# Patient Record
Sex: Female | Born: 1955
Health system: Southern US, Community
[De-identification: ages and names within clinical notes are randomized; demographics above are authoritative.]

## PROBLEM LIST (undated history)

## (undated) DIAGNOSIS — I8392 Asymptomatic varicose veins of left lower extremity: Secondary | ICD-10-CM

## (undated) DIAGNOSIS — C801 Malignant (primary) neoplasm, unspecified: Secondary | ICD-10-CM

## (undated) DIAGNOSIS — I8390 Asymptomatic varicose veins of unspecified lower extremity: Secondary | ICD-10-CM

## (undated) DIAGNOSIS — D649 Anemia, unspecified: Secondary | ICD-10-CM

## (undated) HISTORY — DX: Anemia, unspecified: D64.9

## (undated) HISTORY — PX: NO PAST SURGERIES: SHX2092

## (undated) HISTORY — PX: OTHER SURGICAL HISTORY: SHX169

## (undated) HISTORY — DX: Asymptomatic varicose veins of unspecified lower extremity: I83.90

---

## 2004-06-26 ENCOUNTER — Other Ambulatory Visit: Admission: RE | Admit: 2004-06-26 | Discharge: 2004-06-26 | Payer: Self-pay | Admitting: Family Medicine

## 2007-02-11 ENCOUNTER — Other Ambulatory Visit: Admission: RE | Admit: 2007-02-11 | Discharge: 2007-02-11 | Payer: Self-pay | Admitting: Family Medicine

## 2007-04-16 ENCOUNTER — Encounter: Admission: RE | Admit: 2007-04-16 | Discharge: 2007-04-16 | Payer: Self-pay | Admitting: Family Medicine

## 2008-07-18 LAB — FERRITIN: Ferritin: 4.7

## 2008-07-18 LAB — IRON: IRON: 35

## 2008-07-18 LAB — HDL CHOLESTEROL: CHOL/HDL RATIO, SERUM: 2.8

## 2008-07-19 LAB — LIPID PANEL
CHOLESTEROL: 224 mg/dL — AB (ref 0–200)
HDL: 80 mg/dL — AB (ref 35–70)
LDL Cholesterol: 104 mg/dL
TRIGLYCERIDES: 206 mg/dL — AB (ref 40–160)

## 2008-07-19 LAB — BASIC METABOLIC PANEL: CREATININE: 0.7 mg/dL (ref 0.5–1.1)

## 2008-07-19 LAB — CBC AND DIFFERENTIAL: HEMOGLOBIN: 9.9 g/dL — AB (ref 12.0–16.0)

## 2008-07-19 LAB — TSH: TSH: 3 u[IU]/mL (ref 0.41–5.90)

## 2013-06-02 LAB — LIPID PANEL
Cholesterol: 226 mg/dL — AB (ref 0–200)
HDL: 75 mg/dL — AB (ref 35–70)
LDL Cholesterol: 130 mg/dL
Triglycerides: 103 mg/dL (ref 40–160)

## 2013-06-02 LAB — BASIC METABOLIC PANEL: Creatinine: 0.6 mg/dL (ref 0.5–1.1)

## 2013-06-09 ENCOUNTER — Ambulatory Visit: Payer: BC Managed Care – PPO | Admitting: Neurology

## 2013-06-09 ENCOUNTER — Encounter: Payer: Self-pay | Admitting: Neurology

## 2013-06-11 ENCOUNTER — Ambulatory Visit (INDEPENDENT_AMBULATORY_CARE_PROVIDER_SITE_OTHER): Payer: BC Managed Care – PPO | Admitting: Neurology

## 2013-06-11 ENCOUNTER — Encounter: Payer: Self-pay | Admitting: Neurology

## 2013-06-11 VITALS — BP 106/63 | HR 66 | Ht 64.5 in | Wt 137.0 lb

## 2013-06-11 DIAGNOSIS — R209 Unspecified disturbances of skin sensation: Secondary | ICD-10-CM

## 2013-06-11 DIAGNOSIS — R531 Weakness: Secondary | ICD-10-CM | POA: Insufficient documentation

## 2013-06-11 DIAGNOSIS — R200 Anesthesia of skin: Secondary | ICD-10-CM | POA: Insufficient documentation

## 2013-06-11 DIAGNOSIS — R5381 Other malaise: Secondary | ICD-10-CM

## 2013-06-11 NOTE — Progress Notes (Signed)
[facility]    Provider:  Dr Hosie Poisson Referring Provider: Edison Pace, FNP Primary Care Physician:  Edison Pace  CC:  RLE weakness and sensory changes  HPI:  Paige Gilbert is a 57 y.o. female here as a referral from Dr. Maisie Fus for RLE weakness and sensory changes.  Symptoms started around 5 years ago, noted severe throbbing pain in right mid thigh, went away after short time. Will occasionally return periodically, lasts for few seconds and then goes away. Notes a generalized heaviness in her leg, feels it is harder to pick up. This is constant. No numbness or paresthesias in her toes. Also notes some heaviness in her right shoulder and arm. No weakness noted. No symptoms on her left side. No episodes of blurry vision or loss of vision.   Has plantar fascitis in right foot, improved and then got worse when she started to run again. Around 5 years ago had brief episodes of recurrent pain in her left forearm.   No history of HTN, DM, no prior stroke or mini strokes.   Review of Systems: Out of a complete 14 system review, the patient complains of only the following symptoms, and all other reviewed systems are negative. Positive for numbness  History   Social History  . Marital Status: Married    Spouse Name: Jeannett Senior    Number of Children: 5  . Years of Education: 16   Occupational History  . Not on file.   Social History Main Topics  . Smoking status: Never Smoker   . Smokeless tobacco: Never Used  . Alcohol Use: Yes     Comment: rarely  . Drug Use: No  . Sexual Activity: Not on file   Other Topics Concern  . Not on file   Social History Narrative   Patient is married Jeannett Senior).   Patient has 5 children.   Patient works full-time.   Patient drinks one cup of coffee daily.   Patient has a college education (BS).   Patient is right-handed.    Family History  Problem Relation Age of Onset  . Diabetes Father   . Blindness Mother   .  Congestive Heart Failure Sister   . Gallbladder disease Sister     removed  . Diabetes Sister   . Colitis Sister     Past Medical History  Diagnosis Date  . Anemia   . Varicose vein     Past Surgical History  Procedure Laterality Date  . None      Current Outpatient Prescriptions  Medication Sig Dispense Refill  . Calcium Citrate-Vitamin D (CALCIUM CITRATE + D PO) Take by mouth 2 (two) times daily as needed.      . Cholecalciferol (VITAMIN D PO) Take by mouth daily.      . Multiple Vitamin (MULTIVITAMIN) capsule Take 1 capsule by mouth daily.       No current facility-administered medications for this visit.    Allergies as of 06/11/2013  . (No Known Allergies)    Vitals: BP 106/63  Pulse 66  Ht 5' 4.5" (1.638 m)  Wt 137 lb (62.143 kg)  BMI 23.16 kg/m2 Last Weight:  Wt Readings from Last 1 Encounters:  06/11/13 137 lb (62.143 kg)   Last Height:   Ht Readings from Last 1 Encounters:  06/11/13 5' 4.5" (1.638 m)     Physical exam: Exam: Gen: NAD, conversant Eyes: anicteric sclerae, moist conjunctivae HENT: Atraumatic, oropharynx clear Neck: Trachea midline; supple,  Lungs: CTA,  no wheezing, rales, rhonic                          CV: RRR, no MRG Abdomen: Soft, non-tender;  Extremities: No peripheral edema  Skin: Normal temperature, no rash,  Psych: Appropriate affect, pleasant  Neuro: MS: AA&Ox3, appropriately interactive, normal affect   Speech: fluent w/o paraphasic error  Memory: good recent and remote recall  CN: PERRL, EOMI no nystagmus, no ptosis, sensation intact to LT V1-V3 bilat, face symmetric, no weakness, hearing grossly intact, palate elevates symmetrically, shoulder shrug 5/5 bilat,  tongue protrudes midline, no fasiculations noted.  Motor: normal bulk and tone Strength: 5/5  In all extremities with exception of minimal 5 minus out of 5 weakness in proximal right lower extremity  Coord: rapid alternating and point-to-point (FNF,  HTS) movements intact.  Reflexes: symmetrical, bilat downgoing toes  Sens: Minimally decreased light touch on the right side upper and lower extremity compared to the left  Gait: posture, stance, stride and arm-swing normal. Tandem gait intact. Able to walk on heels and toes. Romberg absent.   Assessment:  After physical and neurologic examination, review of laboratory studies, imaging, neurophysiology testing and pre-existing records, assessment will be reviewed on the problem list.  Plan:  Treatment plan and additional workup will be reviewed under Problem List.  1)Weakness 2)Hemisensory loss  57 year old woman presenting for initial evaluation of sensation of heaviness and weakness on the right lower extremity and upper extremity. The symptoms have been ongoing for a few years. She also notes intermittent episodes of pain in the right anterior thigh and in the past pain in the left forearm. Unclear etiology of her symptoms. With unilateral nature of symptoms would need to rule out central process. Will check brain MRI. Follow up once MRI completed.

## 2013-06-11 NOTE — Patient Instructions (Signed)
Overall you are doing fairly well but I do want to suggest a few things today:   Remember to drink plenty of fluid, eat healthy meals and do not skip any meals. Try to eat protein with a every meal and eat a healthy snack such as fruit or nuts in between meals. Try to keep a regular sleep-wake schedule and try to exercise daily, particularly in the form of walking, 20-30 minutes a day, if you can.   We will check a brain MRI to help determine the cause of your symptoms. Someone will call you in a few days to schedule this.  We will call you once the MRI is complete to discuss the results. Please call us with any interim questions, concerns, problems, updates or refill requests.   My clinical assistant and will answer any of your questions and relay your messages to me and also relay most of my messages to you.   Our phone number is [phone]. We also have an after hours call service for urgent matters and there is a physician on-call for urgent questions. For any emergencies you know to call 911 or go to the nearest emergency room

## 2013-07-05 ENCOUNTER — Ambulatory Visit
Admission: RE | Admit: 2013-07-05 | Discharge: 2013-07-05 | Disposition: A | Discharge status: Home / Self Care | Payer: BC Managed Care – PPO | Source: Ambulatory Visit | Attending: Neurology | Admitting: Neurology

## 2013-07-05 ENCOUNTER — Other Ambulatory Visit: Payer: Self-pay

## 2013-07-05 DIAGNOSIS — R531 Weakness: Secondary | ICD-10-CM

## 2013-07-05 DIAGNOSIS — R200 Anesthesia of skin: Secondary | ICD-10-CM

## 2013-07-05 DIAGNOSIS — R5381 Other malaise: Secondary | ICD-10-CM

## 2013-07-05 DIAGNOSIS — R5383 Other fatigue: Secondary | ICD-10-CM

## 2013-07-13 ENCOUNTER — Telehealth: Payer: Self-pay | Admitting: Neurology

## 2013-07-13 NOTE — Telephone Encounter (Signed)
Please advise 

## 2013-07-14 NOTE — Telephone Encounter (Signed)
Please advise 

## 2013-07-14 NOTE — Telephone Encounter (Signed)
REGARDING MRI RESULTS

## 2013-08-03 NOTE — Telephone Encounter (Signed)
I called pt after consulting Dr. Janann Colonel.  MRI basically normal.  (one spot of non specific gliosis).  She reiterated all of her sx R sided.  Did not want to make  F/u appt at this time.   Will call if worsening sx.

## 2013-08-03 NOTE — Telephone Encounter (Signed)
Please try cell if home is not answered. This phonenote was never handled she still wants her MRI results

## 2015-02-15 ENCOUNTER — Encounter: Payer: Self-pay | Admitting: Family Medicine

## 2015-02-15 ENCOUNTER — Ambulatory Visit (INDEPENDENT_AMBULATORY_CARE_PROVIDER_SITE_OTHER): Payer: 59 | Admitting: Family Medicine

## 2015-02-15 VITALS — BP 122/68 | HR 67 | Wt 134.0 lb

## 2015-02-15 DIAGNOSIS — M545 Low back pain, unspecified: Secondary | ICD-10-CM | POA: Insufficient documentation

## 2015-02-15 DIAGNOSIS — Z1211 Encounter for screening for malignant neoplasm of colon: Secondary | ICD-10-CM | POA: Diagnosis not present

## 2015-02-15 DIAGNOSIS — M722 Plantar fascial fibromatosis: Secondary | ICD-10-CM | POA: Diagnosis not present

## 2015-02-15 DIAGNOSIS — Z139 Encounter for screening, unspecified: Secondary | ICD-10-CM | POA: Diagnosis not present

## 2015-02-15 DIAGNOSIS — Z862 Personal history of diseases of the blood and blood-forming organs and certain disorders involving the immune mechanism: Secondary | ICD-10-CM

## 2015-02-15 NOTE — Assessment & Plan Note (Addendum)
Plan for watchfal waiting. Continue intermittent NSAIDs. If worsening will proceed with x-ray. May consider physical therapy intermittently for exacerbations needed.

## 2015-02-15 NOTE — Assessment & Plan Note (Signed)
Sent for mammography and colonoscopy. Routine laboratory screening for liver kidney cholesterol and anemia.

## 2015-02-15 NOTE — Assessment & Plan Note (Signed)
Heel cups dispensed today. Work on eccentric exercises and ice massage. Consider orthotics if not better.

## 2015-02-15 NOTE — Progress Notes (Signed)
Paige Gilbert is a 59 y.o. female who presents to [facility]  today for establish care. Patient is a healthy 59 year old woman here to establish care. She has a history of intermittent left-sided low back pain and left plantar fasciitis. Otherwise she is asymptomatic.  1)  Back pain: Left-sided. Pain is intermittent for years but worse recently over the last month. No radiating pain weakness or numbness fevers chills nausea vomiting or diarrhea. No night sweats or weight loss. She takes ibuprofen which helps a lot.  2) plantar fasciitis left-sided: Present for 2 years mild intermittent off and on. She's done some home exercise program for this which has helped. She does not have any orthotics or heel cups. Pain is worse with activity.   3) history of anemia: Patient has history of anemia with a hemoglobin less than 10 and a ferritin less than 5. She is not currently taking iron. She is postmenopausal.   4) Cancer screening: Patient is due for colon cancer and breast cancer screening. She thinks she has not had a Pap smear in several years as well.    Past Medical History  Diagnosis Date  . Anemia   . Varicose vein    Past Surgical History  Procedure Laterality Date  . None     History  Substance Use Topics  . Smoking status: Never Smoker   . Smokeless tobacco: Never Used  . Alcohol Use: Yes     Comment: rarely   ROS as above Medications: Current Outpatient Prescriptions  Medication Sig Dispense Refill  . Calcium Citrate-Vitamin D (CALCIUM CITRATE + D PO) Take by mouth 2 (two) times daily as needed.     No current facility-administered medications for this visit.   No Known Allergies   Exam:  BP 122/68 mmHg  Pulse 67  Wt 134 lb (60.782 kg) Gen: Well NAD HEENT: EOMI,  MMM Lungs: Normal work of breathing. CTABL Heart: RRR no MRG Abd: NABS, Soft. Nondistended, Nontender Exts: Brisk capillary refill, warm and well perfused.   back:  Nontender to midline. Tender palpation left lumbar paraspinal. Normal back range of motion. Reflexes strength are equal and intact bilateral lower extremities. Normal gait. Feet bilaterally left foot mildly tender plantar calcaneus left nontender. Otherwise feet are symmetrical with normal-appearing arch with no break down. Pulses and capillary refill and sensation are intact.  No results found for this or any previous visit (from the past 24 hour(s)). No results found.   Please see individual assessment and plan sections.

## 2015-02-15 NOTE — Patient Instructions (Signed)
Thank you for coming in today. Return in 1 year or sooner if needed.  Do the exercises for plantar fasciitis.  Let me know if you want to go to physical therapy for your back.  Come back or go to the emergency room if you notice new weakness new numbness problems walking or bowel or bladder problems. Call or go to the emergency room if you get worse, have trouble breathing, have chest pains, or palpitations.

## 2015-02-16 ENCOUNTER — Telehealth: Payer: Self-pay | Admitting: Family Medicine

## 2015-02-16 DIAGNOSIS — N183 Chronic kidney disease, stage 3 unspecified: Secondary | ICD-10-CM | POA: Insufficient documentation

## 2015-02-16 LAB — COMPLETE METABOLIC PANEL WITH GFR
ALK PHOS: 58 U/L (ref 39–117)
ALT: 21 U/L (ref 0–35)
AST: 19 U/L (ref 0–37)
Albumin: 4.1 g/dL (ref 3.5–5.2)
BUN: 7 mg/dL (ref 6–23)
CO2: 27 mEq/L (ref 19–32)
CREATININE: 2.11 mg/dL — AB (ref 0.50–1.10)
Calcium: 9.3 mg/dL (ref 8.4–10.5)
Chloride: 105 mEq/L (ref 96–112)
GFR, EST AFRICAN AMERICAN: 29 mL/min — AB
GFR, EST NON AFRICAN AMERICAN: 25 mL/min — AB
GLUCOSE: 76 mg/dL (ref 70–99)
Potassium: 4.9 mEq/L (ref 3.5–5.3)
SODIUM: 142 meq/L (ref 135–145)
Total Bilirubin: 0.4 mg/dL (ref 0.2–1.2)
Total Protein: 6.6 g/dL (ref 6.0–8.3)

## 2015-02-16 LAB — CBC
HCT: 38.8 % (ref 36.0–46.0)
Hemoglobin: 13.1 g/dL (ref 12.0–15.0)
MCH: 28.7 pg (ref 26.0–34.0)
MCHC: 33.8 g/dL (ref 30.0–36.0)
MCV: 85.1 fL (ref 78.0–100.0)
MPV: 9.4 fL (ref 8.6–12.4)
Platelets: 159 10*3/uL (ref 150–400)
RBC: 4.56 MIL/uL (ref 3.87–5.11)
RDW: 13.4 % (ref 11.5–15.5)
WBC: 4.9 10*3/uL (ref 4.0–10.5)

## 2015-02-16 LAB — IRON AND TIBC
%SAT: 35 % (ref 20–55)
IRON: 94 ug/dL (ref 42–145)
TIBC: 272 ug/dL (ref 250–470)
UIBC: 178 ug/dL (ref 125–400)

## 2015-02-16 LAB — LDL CHOLESTEROL, DIRECT: LDL DIRECT: 91 mg/dL

## 2015-02-16 LAB — FERRITIN: FERRITIN: 70 ng/mL (ref 10–291)

## 2015-02-16 NOTE — Telephone Encounter (Signed)
Ms Beinhoff's creatine is elevated. I think this is likely a chronic process. We need to refer to nephrology for evaluation.  CMA will contact the patient.

## 2015-02-16 NOTE — Telephone Encounter (Signed)
Pt ha

## 2015-02-17 ENCOUNTER — Telehealth: Payer: Self-pay

## 2015-02-17 NOTE — Telephone Encounter (Signed)
Pt wants to know if you think it ok for her to travel out of the country with her new diagnoses of kidney disease. She is planning on going on an 11 day trip. Please advise.

## 2015-02-17 NOTE — Telephone Encounter (Signed)
Pt notified of results and recommendations.

## 2015-02-19 NOTE — Telephone Encounter (Signed)
It should be fine. Her kidney disease is likely chronic. She should drink plenty of fluids and avoid dehydration. Limit use of ibuprofen and aleve. Tylenol for pain is safe to use.

## 2015-02-20 NOTE — Telephone Encounter (Signed)
Pt notified of recommendations

## 2015-02-21 ENCOUNTER — Encounter: Payer: Self-pay | Admitting: Family Medicine

## 2015-02-24 ENCOUNTER — Telehealth: Payer: Self-pay

## 2015-02-24 DIAGNOSIS — Z139 Encounter for screening, unspecified: Secondary | ICD-10-CM

## 2015-02-24 DIAGNOSIS — N183 Chronic kidney disease, stage 3 unspecified: Secondary | ICD-10-CM

## 2015-02-24 NOTE — Telephone Encounter (Signed)
Pt called requesting to have her creatinine and GFR rechecked as well as a lipid panel. Labs ordered.

## 2015-03-02 ENCOUNTER — Encounter: Payer: Self-pay | Admitting: Family Medicine

## 2015-03-28 LAB — BASIC METABOLIC PANEL WITH GFR
BUN: 17 mg/dL (ref 7–25)
CHLORIDE: 105 mmol/L (ref 98–110)
CO2: 24 mmol/L (ref 20–31)
Calcium: 8.8 mg/dL (ref 8.6–10.4)
Creat: 0.76 mg/dL (ref 0.50–1.05)
GFR, EST NON AFRICAN AMERICAN: 87 mL/min (ref >=60)
GFR, Est African American: >89 mL/min (ref >=60)
GLUCOSE: 91 mg/dL (ref 65–99)
POTASSIUM: 4 mmol/L (ref 3.5–5.3)
Sodium: 138 mmol/L (ref 135–146)

## 2015-03-28 NOTE — Telephone Encounter (Signed)
Quick Note:  Kidney function has normalized. !!! ______

## 2016-03-21 ENCOUNTER — Encounter: Payer: Self-pay | Admitting: Family Medicine

## 2019-07-13 ENCOUNTER — Other Ambulatory Visit: Payer: Self-pay | Admitting: Oncology

## 2019-08-03 ENCOUNTER — Telehealth: Payer: Self-pay | Admitting: *Deleted

## 2019-08-03 DIAGNOSIS — Z170 Estrogen receptor positive status [ER+]: Secondary | ICD-10-CM | POA: Insufficient documentation

## 2019-08-03 DIAGNOSIS — C50411 Malignant neoplasm of upper-outer quadrant of right female breast: Secondary | ICD-10-CM

## 2019-08-03 NOTE — Progress Notes (Signed)
Paige Gilbert  Telephone:([phone] Fax:([phone]) [phone]     ID: [name] DOB: 1955-11-11  MR#: [id number]  ZHG#:[id number]  Patient Care Team: [name], NP as PCP - General (Family Medicine) [name], RN as Oncology Nurse Navigator [name], RN as Oncology Nurse Navigator Loriana Samad, [name], MD as Consulting Physician (Oncology) [name], MD as Attending Physician (Radiation Oncology) [name], MD as Consulting Physician (General Surgery) [name], MD OTHER MD:  CHIEF COMPLAINT: Locally advanced estrogen receptor positive breast cancer  CURRENT TREATMENT: Neoadjuvant chemotherapy   HISTORY OF CURRENT ILLNESS: Paige Gilbert has noted a difference between her right and left breast for several years.  She last had a mammogram she thinks about 7 or 8 years ago.  More recently it seemed to her that her right breast was becoming more firm.  She brought it to medical attention and underwent bilateral diagnostic mammography with tomography and right breast ultrasonography at [facility] on 07/07/2019 showing: heterogeneously dense breast tissue; right nipple retraction; architectural distortion with microcalcifications and irregular dense tissue in the upper-outer quadrant; at least 3 abnormal right axillary lymph nodes.  Accordingly on 07/13/2019 she proceeded to biopsy of the right breast area in question. The pathology from this procedure ([id number]) showed:   1. Right Breast, upper-outer quadrant, closer to the nipple  - invasive ductal carcinoma with micropapillary features, grade 2  - ductal carcinoma in situ, grade 2-3, solid and cribriform pattern with comedonecrosis  - Prognostic indicators significant for: estrogen receptor, 100% positive with strong staining intensity and progesterone receptor, 0% negative. HER2 equivocal by immunohistochemistry (2+), but negative by fluorescent in situ hybridization with a  signals ratio 1.5 and number per cell 3.8.  2. Right Breast, upper-outer quadrant, axillary tail  - invasive ductal carcinoma, grade 2  - ductal carcinoma in situ, grade 2-3  - Prognostic indicators significant for: estrogen receptor, 100% positive and progesterone receptor, 50% positive, both with strong staining intensity. HER2 equivocal by immunohistochemistry (2+), but negative by fluorescent in situ hybridization with a signals ratio 1.9 and number per cell 5.3.  The patient's subsequent history is as detailed below.   INTERVAL HISTORY: Paige Gilbert was evaluated in the breast cancer clinic on 08/04/2019 accompanied by her husband Paige Gilbert.  She is already scheduled to meet with Dr. Brantley Stage later this week.  She has not yet been scheduled with the radiation oncologist  REVIEW OF SYSTEMS: Paige Gilbert denies unusual headaches, visual changes, nausea, vomiting, stiff neck, dizziness, or gait imbalance. There has been no cough, phlegm production, or pleurisy, no chest pain or pressure, and no change in bowel or bladder habits. The patient denies fever, rash, bleeding, unexplained fatigue or unexplained weight loss.  For exercise she does her work and also likes to take walks.  A detailed review of systems was otherwise entirely negative.   PAST MEDICAL HISTORY: Past Medical History:  Diagnosis Date  . Anemia   . Varicose vein     PAST SURGICAL HISTORY: Past Surgical History:  Procedure Laterality Date  . none      FAMILY HISTORY: Family History  Problem Relation Age of Onset  . Diabetes Father   . Blindness Mother   . Congestive Heart Failure Sister   . Gallbladder disease Sister        removed  . Diabetes Sister   . Colitis Sister    The patient's father died at age 11 from a stroke.  The patient's mother died from multiple  medical problems not related to cancer at age 28.  The patient had 4 brothers, 7 sisters.  There is no breast ovarian or prostate cancer in the family to her  knowledge.  She had one cousin with pancreatic cancer.  GYNECOLOGIC HISTORY:  No LMP recorded. Patient is postmenopausal. Menarche: 64 years old Age at first live birth: 64 years old Paige Gilbert 5 LMP 10 Contraceptive: Brief use of oral contraceptives as a teenager HRT no Hysterectomy?  No BSO?    SOCIAL HISTORY: (updated 07/2019)  Aryah works in a Engineer, agricultural, and Education administrator houses.  Her husband of 12 years Paige Gilbert is a Animal nutritionist and doing research.  Their children are Paige Gilbert, 58, who lives in [place] and is a Veterinary surgeon in the [employer]; Paige Gilbert 33 who works as a Designer, jewellery at [employer]; Paige Gilbert who lives in [place], and is not currently employed; Paige Gilbert lives in [place], and Paige Gilbert who is an Armed forces training and education officer in [place].  The patient has 4 grandchildren all under the age of 31    ADVANCED DIRECTIVES: Husband Paige Gilbert is her HCPOA.   HEALTH MAINTENANCE: Social History   Tobacco Use  . Smoking status: Never Smoker  . Smokeless tobacco: Never Used  Substance Use Topics  . Alcohol use: Yes    Comment: rarely  . Drug use: No     Colonoscopy: never done  PAP: Remote  Bone density: Never   No Known Allergies  Current Outpatient Medications  Medication Sig Dispense Refill  . Calcium Citrate-Vitamin D (CALCIUM CITRATE + D PO) Take by mouth 2 (two) times daily as needed.     No current facility-administered medications for this visit.    OBJECTIVE: Middle-aged white woman in no acute distress  Vitals:   08/04/19 1559  BP: 116/70  Pulse: 86  Resp: 18  Temp: 97.8 F (36.6 C)  SpO2: 100%     Body mass index is 23.83 kg/m.   Wt Readings from Last 3 Encounters:  08/04/19 141 lb (64 kg)  02/15/15 134 lb (60.8 kg)  06/11/13 137 lb (62.1 kg)      ECOG FS:1 - Symptomatic but completely ambulatory  Ocular: Sclerae unicteric, pupils round and equal Ear-nose-throat: Wearing a mask Lymphatic: No cervical or supraclavicular  adenopathy Lungs no rales or rhonchi Heart regular rate and rhythm Abd soft, nontender, positive bowel sounds MSK no focal spinal tenderness, no joint edema Neuro: non-focal, well-oriented, appropriate affect Breasts: The right breast is firm in the whole upper half, with evidence of skin involvement and nipple retraction.  There is at least one very small palpable lymph node in the right axilla.  The left breast is unremarkable.  Right breast 08/13/2018     LAB RESULTS:  CMP     Component Value Date/Time   NA 139 08/04/2019 1546   K 4.3 08/04/2019 1546   CL 105 08/04/2019 1546   CO2 25 08/04/2019 1546   GLUCOSE 104 (H) 08/04/2019 1546   BUN 25 (H) 08/04/2019 1546   CREATININE 0.79 08/04/2019 1546   CREATININE 0.76 03/27/2015 1146   CALCIUM 8.9 08/04/2019 1546   PROT 7.0 08/04/2019 1546   ALBUMIN 4.2 08/04/2019 1546   AST 17 08/04/2019 1546   ALT 21 08/04/2019 1546   ALKPHOS 67 08/04/2019 1546   BILITOT 0.2 (L) 08/04/2019 1546   GFRNONAA >60 08/04/2019 1546   GFRNONAA 87 03/27/2015 1146   GFRAA >60 08/04/2019 1546   GFRAA >89 03/27/2015 1146    No results  found for: TOTALPROTELP, ALBUMINELP, A1GS, A2GS, BETS, BETA2SER, GAMS, MSPIKE, SPEI  Lab Results  Component Value Date   WBC 5.7 08/04/2019   NEUTROABS 3.3 08/04/2019   HGB 13.1 08/04/2019   HCT 38.8 08/04/2019   MCV 86.0 08/04/2019   PLT 177 08/04/2019    No results found for: LABCA2  No components found for: TRRNHA579  No results for input(s): INR in the last 168 hours.  No results found for: LABCA2  No results found for: UXY333  No results found for: OVA919  No results found for: TYO060  No results found for: CA2729  No components found for: HGQUANT  No results found for: CEA1 / No results found for: CEA1   No results found for: AFPTUMOR  No results found for: CHROMOGRNA  No results found for: KPAFRELGTCHN, LAMBDASER, KAPLAMBRATIO (kappa/lambda light chains)  No results found for:  HGBA, HGBA2QUANT, HGBFQUANT, HGBSQUAN (Hemoglobinopathy evaluation)   No results found for: LDH  Lab Results  Component Value Date   IRON 94 02/15/2015   TIBC 272 02/15/2015   IRONPCTSAT 35 02/15/2015   (Iron and TIBC)  Lab Results  Component Value Date   FERRITIN 70 02/15/2015    Urinalysis No results found for: COLORURINE, APPEARANCEUR, LABSPEC, PHURINE, GLUCOSEU, HGBUR, BILIRUBINUR, KETONESUR, PROTEINUR, UROBILINOGEN, NITRITE, LEUKOCYTESUR   STUDIES: No results found. Case Report Surgical Pathology                Case: [id number]                 Authorizing Provider: [name], NP     Collected:      07/13/2019 0901       Ordering Location:   [facility] Radiology Department Received:      07/13/2019 0933       Pathologist:      Geanie Logan, MD                               Specimens:  1) - Breast, Needle Core Biopsy, Right Upper Outer Mass                       2) - Breast, Needle Core Biopsy, Right Upper Outer Mass                  Reason for Amendment  Amended synoptic report for part 1 with results of FISH. The diagnosis is unchanged. Comment: This value was suppressed on a previously verified result.   Addendum 2 PART 2 HER2 FISH  HER2 FISH analysis performed by two observers at [facility] demonstrated the following (see attached report for details):  HER2/CEN-17 ratio: 1.9 HER2 signals/cell: 5.3 ISH group 4 ? In the context of the patient's IHC results (2+), the HER2 is interpreted as NEGATIVE* per the updated 2018 ASCO/CAP HER2 testing guidelines. ? *It is uncertain whether patients with an average of ? 4.0 and < 6.0 HER2 signals per cell and an HER2/CEP17 ratio of < 2.0 benefit from HER2 targeted therapy in the absence of protein overexpression (IHC 3+). If the specimen test result is close to the ISH ratio threshold for  positive, there is a high likelihood that repeat testing will result in different results by chance alone. Therefore, when [facility] results are not 3+ positive, it is recommended that the sample be considered HER2 negative without additional testing on the same specimen. Comment: This value was suppressed  on a previously verified result.   Addendum 1  PART 1 HER2 FISH  HER-2 FISH studies performed and interpreted at [facility] are negative for amplification.  HER2/CEN-17 Ratio: 1.5 Avg number of HER2 Signals/Nucleus: 3.8  Please see separate report for full details. Comment: This value was suppressed on a previously verified result.   Final Diagnosis 1.  Right breast, upper outer mass closer to the nipple, ultrasound guided needle core biopsy:   Invasive ductal carcinoma with micropapillary features, Nottingham combined histologic grade 2 of 3, measuring at least 0.5 cm.  Ductal carcinoma in situ (DCIS), nuclear grade II-III, solid and cribriform pattern with comedonecrosis.  2.  Right breast, upper outer mass, axillary tail, ultrasound guided needle core biopsy:   Invasive ductal carcinoma, Nottingham combined histologic grade 2 of 3, measuring at least 1.0 cm.  Ductal carcinoma in situ (DCIS), nuclear grade II-III.  Note:  Immunohistochemical stains for ER, PR and Her-2/neu are in progress for parts 1 and 2, and those results will be reported in a separate addendum.   Synoptic Report Breast Biomarker Reporting Template ((Added in Addendum) BREAST: BIOMARKER REPORTING TEMPLATE - 1)  Protocol posted: 09/23/2018    Test(s) Performed:      Estrogen Receptor (ER) Status:  Positive (greater than 10% of cells demonstrate nuclear positivity)      Percentage of Cells with Nuclear Positivity:  100 %     Average Intensity of Staining:  Strong     Test Type:  Food and Drug Administration (FDA) cleared (test / vendor): [employer]     Primary Antibody:  SP1    Test(s)  Performed:      Progesterone Receptor (PgR) Status:  Negative (less than 1%)      :  Internal control cells present and stain as expected     Test Type:  Food and Drug Administration (FDA) cleared (test / vendor): [employer]ana     Primary Antibody:  1E2    Test(s) Performed:      HER2 by Immunohistochemistry:  Equivocal (Score 2+)      Percentage of Cells with Uniform Intense Complete Membrane Staining:  5 %    Test Type:  Food and Drug Administration (FDA) cleared (test / vendor): [employer]     Primary Antibody:  4B5    Test(s) Performed:      HER2 by in situ Hybridization:  Negative (not amplified)     Method:  Dual probe assay      Average Number of HER2 Signals per Cell:  3.8      Average Number of CEP17 Signals per Cell:  2.5      HER2 / CEP17 Ratio:  1.5     Test Type:  Food and Drug Administration (FDA) cleared (test / vendor): [employer]AKO HER2 IQFISH    Cold Ischemia and Fixation Times:  Meet requirements specified in latest version of the ASCO / CAP Guidelines   METHODS   Fixative:  Formalin    Image Analysis:  Not performed   Comment(s)  Comment(s):  Her 2 sent for [facility]  Breast Biomarker Reporting Template ((Added in Addendum) BREAST: BIOMARKER REPORTING TEMPLATE - 2)  Protocol posted: 09/23/2018    Test(s) Performed:      Estrogen Receptor (ER) Status:  Positive (greater than 10% of cells demonstrate nuclear positivity)      Percentage of Cells with Nuclear Positivity:  100 %     Average Intensity of Staining:  Strong     Test Type:  Food  and Drug Administration (FDA) cleared (test / vendor): Ventana     Primary Antibody:  SP1    Test(s) Performed:      Progesterone Receptor (PgR) Status:  Positive      Percentage of Cells with Nuclear Positivity:  50 %     Average Intensity of Staining:  Strong     Test Type:  Food and Drug Administration (FDA) cleared  (test / vendor): Ventana     Primary Antibody:  1E2    Test(s) Performed:      HER2 by Immunohistochemistry:  Equivocal (Score 2+)      Percentage of Cells with Uniform Intense Complete Membrane Staining:  2 %    Test Type:  Food and Drug Administration (FDA) cleared (test / vendor): [employer]ana     Primary Antibody:  4B5    Cold Ischemia and Fixation Times:  Meet requirements specified in latest version of the ASCO / CAP Guidelines   METHODS   Fixative:  Formalin    Image Analysis:  Not performed   Comment(s)  Comment(s):  Her 2 sent for [facility]    Clinical Information Diagnosis: N63.20 - Mass of left breast [ICD-10-CM]  #1 Time out: 0901 Time in: 0901 Total time in formalin: 6 hours, 26 minutes. #2 Time out: 0913 Time in: 0913 Total time in formalin: 6 hours, 17 minutes.   Gross Description 1. Labeled "right upper outer mass closer to the nipple". Received in formalin are approximately 5 yellow-white needle core biopsy fragments up to 1.9 cm, inked with hematoxylin, all in one.  2. Labeled "right upper outer mass, axillary tail". Received in formalin are approximately 3 yellow-white needle core biopsy fragments up to 1.7 cm, inked with hematoxylin, all in one.   Disclaimer The above diagnosis is based on performance of thorough gross and/or microscopic evaluations. Immunoperoxidase procedures were developed and performance characteristics determined by [facility]. Not all have been cleared or approved by the U.S. Food and Drug Administration.      For Flow Cytometry and Class I analyte-specific reagents (ASRs): This test was developed and its performance characteristics determined by [facility]. It has not been cleared or approved by the [place] Food and Drug Administration. The FDA does not require this test to go through premarket FDA review. This test is used for clinical purposes. It should  not be regarded as investigational or for research. This laboratory is certified under the Clinical Laboratory Improvement Amendments (CLIA) as qualified to perform high complexity clinical laboratory testing.  [facility] CLIA Medical Director: Judithann Sauger, MD   Specimen Collected on  Tissue - Breast structure (body structure), Tissue specimen (specimen) - Breast      ELIGIBLE FOR AVAILABLE RESEARCH PROTOCOL:   ASSESSMENT: 64 y.o. [place], [place] woman status post right breast biopsy x2 on 07/13/2019 for a clinical T3-T4 N1, stage III invasive ductal carcinoma, grade 2, estrogen receptor positive, progesterone receptor variable, HER-2 not amplified.  (1) neoadjuvant chemotherapy to consist of cyclophosphamide and doxorubicin in dose dense fashion x4 followed by weekly paclitaxel x12  (2) definitive surgery to follow  (3) adjuvant radiation as appropriate  (4) antiestrogens to start at the completion of local treatment  PLAN: I spent approximately 60 minutes face to face with Paige Gilbert with more than 50% of that time spent in counseling and coordination of care. Specifically we reviewed the biology of the patient's diagnosis and the specifics of her situation.  We first reviewed  the fact that cancer is not one disease but more than 100 different diseases and that it is important to keep them separate-- otherwise when friends and relatives discuss their own cancer experiences with [facility] confusion can result. Similarly we explained that if breast cancer spreads to the bone or liver, the patient would not have bone cancer or liver cancer, but breast cancer in the bone and breast cancer in the liver: one cancer in three places-- not 3 different cancers which otherwise would have to be treated in 3 different ways.  We discussed the difference between local and systemic therapy. In terms of loco-regional treatment, lumpectomy plus radiation is equivalent to mastectomy as far  as survival is concerned.  However given the locally advanced stage of her breast cancer at this point there is no question that breast conserving surgery would not be possible.  For that reason I would recommend neoadjuvant chemotherapy.  She understands there is no survival advantage to starting with chemotherapy or to starting with surgery.  The results are equivalent.  The advantage of starting with chemotherapy in her case is that she may have some small possibility of saving the breast.  More importantly she will have better margins and the surgery will be facilitated.  We then discussed the rationale for systemic therapy. There is some risk that this cancer may have already spread to other parts of her body.  In stage III disease that risk is probably in the 40 to 60% range.  Accordingly she does need staging studies and we are setting her up for a CT of the chest and a bone scan.  Since she has no symptoms at present I am hopeful that those will be negative.  Next we went over the options for systemic therapy which are anti-estrogens, anti-HER-2 immunotherapy, and chemotherapy. Paige Gilbert does not meet criteria for anti-HER-2 immunotherapy. She is a good candidate for anti-estrogens and she will need chemotherapy as well.  We specifically discussed cyclophosphamide and doxorubicin followed by weekly paclitaxel.  We reviewed the possible toxicities side effects and complications of these agents.  The patient is agreeable and we are hoping to start on August 16, 2018  She will need a port, a chest CT scan, a bone scan, a breast MRI, and an echocardiogram.  I have placed all those orders and sent a note to her surgeon Dr. Brantley Stage who will be seeing the patient in the next few days.  I am also arranging for her to meet with our chemotherapy teaching nurse.  The patient will then return to see me a few days before the start of chemo to review any questions or concerns she may have before actually  starting treatment  Paige Gilbert has a good understanding of the overall plan. She agrees with it. She knows the goal of treatment in her case is cure. She will call with any problems that may develop before her next visit here.  Total time spent in this encounter 70 minutes  [name], MD   08/04/2019 5:28 PM Medical Oncology and Hematology [facility] [place], [place] [address] Tel. [phone]    Fax. [phone]   This document serves as a record of services personally performed by Lurline Del, MD. It was created on his behalf by Wilburn Mylar, a trained medical scribe. The creation of this record is based on the scribe's personal observations and the provider's statements to them.   Lindie Spruce MD, have reviewed the  above documentation for accuracy and completeness, and I agree with the above.

## 2019-08-03 NOTE — Telephone Encounter (Signed)
Received self referral from pt to be seen at [facility]. Pt with mammo/US/bx at [facility]. Scheduled and confirmed appt with Dr. Jana Hakim on 1/6 at 4pm with labs at 3:30. Gave pt instructions and directions. Contact information provided for questions or needs.

## 2019-08-04 ENCOUNTER — Inpatient Hospital Stay: Payer: Managed Care, Other (non HMO) | Attending: Oncology

## 2019-08-04 ENCOUNTER — Inpatient Hospital Stay (HOSPITAL_BASED_OUTPATIENT_CLINIC_OR_DEPARTMENT_OTHER): Payer: Managed Care, Other (non HMO) | Admitting: Oncology

## 2019-08-04 ENCOUNTER — Other Ambulatory Visit: Payer: Self-pay

## 2019-08-04 VITALS — BP 116/70 | HR 86 | Temp 97.8°F | Resp 18 | Ht 64.5 in | Wt 141.0 lb

## 2019-08-04 DIAGNOSIS — Z170 Estrogen receptor positive status [ER+]: Secondary | ICD-10-CM

## 2019-08-04 DIAGNOSIS — Z452 Encounter for adjustment and management of vascular access device: Secondary | ICD-10-CM | POA: Insufficient documentation

## 2019-08-04 DIAGNOSIS — C50411 Malignant neoplasm of upper-outer quadrant of right female breast: Secondary | ICD-10-CM

## 2019-08-04 DIAGNOSIS — Z5189 Encounter for other specified aftercare: Secondary | ICD-10-CM | POA: Insufficient documentation

## 2019-08-04 DIAGNOSIS — Z5111 Encounter for antineoplastic chemotherapy: Secondary | ICD-10-CM | POA: Insufficient documentation

## 2019-08-04 LAB — CMP (CANCER CENTER ONLY)
ALT: 21 U/L (ref 0–44)
AST: 17 U/L (ref 15–41)
Albumin: 4.2 g/dL (ref 3.5–5.0)
Alkaline Phosphatase: 67 U/L (ref 38–126)
Anion gap: 9 (ref 5–15)
BUN: 25 mg/dL — ABNORMAL HIGH (ref 8–23)
CO2: 25 mmol/L (ref 22–32)
Calcium: 8.9 mg/dL (ref 8.9–10.3)
Chloride: 105 mmol/L (ref 98–111)
Creatinine: 0.79 mg/dL (ref 0.44–1.00)
GFR, Est AFR Am: >60 mL/min (ref >60)
GFR, Estimated: >60 mL/min (ref >60)
Glucose, Bld: 104 mg/dL — ABNORMAL HIGH (ref 70–99)
Potassium: 4.3 mmol/L (ref 3.5–5.1)
Sodium: 139 mmol/L (ref 135–145)
Total Bilirubin: 0.2 mg/dL — ABNORMAL LOW (ref 0.3–1.2)
Total Protein: 7 g/dL (ref 6.5–8.1)

## 2019-08-04 LAB — CBC WITH DIFFERENTIAL (CANCER CENTER ONLY)
Abs Immature Granulocytes: 0.01 10*3/uL (ref 0.00–0.07)
Basophils Absolute: 0.1 10*3/uL (ref 0.0–0.1)
Basophils Relative: 1 %
Eosinophils Absolute: 0.1 10*3/uL (ref 0.0–0.5)
Eosinophils Relative: 1 %
HCT: 38.8 % (ref 36.0–46.0)
Hemoglobin: 13.1 g/dL (ref 12.0–15.0)
Immature Granulocytes: 0 %
Lymphocytes Relative: 34 %
Lymphs Abs: 1.9 10*3/uL (ref 0.7–4.0)
MCH: 29 pg (ref 26.0–34.0)
MCHC: 33.8 g/dL (ref 30.0–36.0)
MCV: 86 fL (ref 80.0–100.0)
Monocytes Absolute: 0.3 10*3/uL (ref 0.1–1.0)
Monocytes Relative: 5 %
Neutro Abs: 3.3 10*3/uL (ref 1.7–7.7)
Neutrophils Relative %: 59 %
Platelet Count: 177 10*3/uL (ref 150–400)
RBC: 4.51 MIL/uL (ref 3.87–5.11)
RDW: 12.7 % (ref 11.5–15.5)
WBC Count: 5.7 10*3/uL (ref 4.0–10.5)
nRBC: 0 % (ref 0.0–0.2)

## 2019-08-04 NOTE — Progress Notes (Signed)
START ON PATHWAY REGIMEN - Breast   Dose-Dense AC q14 days:   A cycle is every 14 days:     Doxorubicin      Cyclophosphamide      [id number]-[id number]   **Always confirm dose/schedule in your pharmacy ordering system**  Paclitaxel 80 mg/m2 Weekly:   Administer weekly:     Paclitaxel   **Always confirm dose/schedule in your pharmacy ordering system**  Patient Characteristics: Preoperative or Nonsurgical Candidate (Clinical Staging), Neoadjuvant Therapy followed by Surgery, Invasive Disease, Chemotherapy, HER2 Negative/Unknown/Equivocal, ER Positive Therapeutic Status: Preoperative or Nonsurgical Candidate (Clinical Staging) AJCC M Category: cM0 AJCC Grade: G2 Breast Surgical Plan: Neoadjuvant Therapy followed by Surgery ER Status: Positive (+) AJCC 8 Stage Grouping: IIIB HER2 Status: Negative (-) AJCC T Category: cT4 AJCC N Category: cN1 PR Status: Positive (+) Intent of Therapy: Curative Intent, Discussed with Patient

## 2019-08-05 ENCOUNTER — Ambulatory Visit: Payer: Self-pay | Admitting: Surgery

## 2019-08-05 ENCOUNTER — Encounter: Payer: Self-pay | Admitting: *Deleted

## 2019-08-05 ENCOUNTER — Other Ambulatory Visit: Payer: Self-pay | Admitting: *Deleted

## 2019-08-05 DIAGNOSIS — C50411 Malignant neoplasm of upper-outer quadrant of right female breast: Secondary | ICD-10-CM

## 2019-08-05 DIAGNOSIS — Z170 Estrogen receptor positive status [ER+]: Secondary | ICD-10-CM

## 2019-08-06 ENCOUNTER — Encounter: Payer: Self-pay | Admitting: Radiation Oncology

## 2019-08-06 ENCOUNTER — Telehealth: Payer: Self-pay | Admitting: *Deleted

## 2019-08-06 ENCOUNTER — Telehealth: Payer: Self-pay | Admitting: Oncology

## 2019-08-06 NOTE — Telephone Encounter (Signed)
R/s appt per 1/8 sch message - pt aware of appt date and time

## 2019-08-06 NOTE — Telephone Encounter (Signed)
Left vm with pt regarding CT scan. Informed of CT scan that is scheduled for 1/12 at 7:45. Contact information provided for questions or needs.

## 2019-08-06 NOTE — Progress Notes (Signed)
Location of Breast Cancer: Right Breast  Histology per Pathology Report:  07/13/19 1. Right Breast, upper-outer quadrant, closer to the nipple             - invasive ductal carcinoma with micropapillary features, grade 2             - ductal carcinoma in situ, grade 2-3, solid and cribriform pattern with comedonecrosis             - Prognostic indicators significant for: estrogen receptor, 100% positive with strong staining intensity and progesterone receptor, 0% negative. Proliferation marker Ki67 at %. HER2 equivocal by immunohistochemistry (2+), but negative by fluorescent in situ hybridization with a signals ratio 1.5 and number per cell 3.8.  2. Right Breast, upper-outer quadrant, axillary tail             - invasive ductal carcinoma, grade 2             - ductal carcinoma in situ, grade 2-3             - Prognostic indicators significant for: estrogen receptor, 100% positive and progesterone receptor, 50% positive, both with strong staining intensity. Proliferation marker Ki67 at %. HER2 equivocal by immunohistochemistry (2+), but negative by fluorescent in situ hybridization with a signals ratio 1.9 and number per cell 5.3   Did patient present with symptoms or was this found on screening mammography?: She noted that her right breast had become firmer than her left and brought it to the attention of a Nurse Practitioner during her annual physical on 06/21/19. The NP palpated a right breast mass in the right upper outer quadrant.   Past/Anticipated interventions by surgeon, if any: Dr. Brantley Stage 08/09/19  Past/Anticipated interventions by medical oncology, if any:  08/04/19 Dr. Jana Hakim 1) neoadjuvant chemotherapy to consist of cyclophosphamide and doxorubicin in dose dense fashion x4 followed by weekly paclitaxel x12 (2) definitive surgery to follow (3) adjuvant radiation as appropriate (4) antiestrogens to start at the completion of local treatment  Lymphedema issues, if any:   N/A  Pain issues, if any:  She denies  SAFETY ISSUES:  Prior radiation? No  Pacemaker/ICD? No  Possible current pregnancy? No  Is the patient on methotrexate? No  Current Complaints / other details:      Paige Gilbert, Stephani Police, RN 08/06/2019,9:45 AM

## 2019-08-09 ENCOUNTER — Ambulatory Visit: Payer: Self-pay | Admitting: Surgery

## 2019-08-09 ENCOUNTER — Telehealth: Payer: Self-pay

## 2019-08-09 NOTE — Telephone Encounter (Signed)
Pt called stating she has contacted insurance company and they have authorized her CT scan.  Pt also states that Per her insurance company that information will be sent to [place] today.

## 2019-08-09 NOTE — H&P (Signed)
Paige Gilbert Documented: 08/09/2019 2:20 PM Location: [facility] Patient #: [id number] DOB: 13-Feb-1956 Married / Language: English / Race: White Female  History of Present Illness Paige Moores A. Erryn Dickison Paige Gilbert; 08/09/2019 2:46 PM) Patient words: Patient sent at the request of Dr. Jana Hakim of oncology due to a recently diagnosed right breast cancer. Patient relates a multi-month history of hardening of the right breast with contraction of the nipple. She went to a Facility and underwent workup which included bilateral diagnostic mammography and right breast ultrasound last month. She had multifocal lesions in the upper outer quadrant near the nipple and up in the axillary tail. Core biopsy showed invasive ductal carcinoma grade 2 with DCIS as she receptor positive progesterone receptor negative and equivocal HER-2/neu. Second area more toward the tail had a similar pattern except this was progesterone receptor positive. She had a maternal aunt with breast cancer. She has had no drainage. She is in need of chemotherapy and is here today to talk about port placement and subsequent surgery down the road.       per oncology    Paige Gilbert has noted a difference between her right and left breast for several years. She last had a mammogram she thinks about 7 or 8 years ago. More recently it seemed to her that her right breast was becoming more firm. She brought it to medical attention and underwent bilateral diagnostic mammography with tomography and right breast ultrasonography at [facility] on 07/07/2019 showing: heterogeneously dense breast tissue; right nipple retraction; architectural distortion with microcalcifications and irregular dense tissue in the upper-outer quadrant; at least 3 abnormal right axillary lymph nodes.  Accordingly on 07/13/2019 she proceeded to biopsy of the right breast area in question. The pathology from this procedure ([id number]) showed:  1.  Right Breast, upper-outer quadrant, closer to the nipple - invasive ductal carcinoma with micropapillary features, grade 2 - ductal carcinoma in situ, grade 2-3, solid and cribriform pattern with comedonecrosis - Prognostic indicators significant for: estrogen receptor, 100% positive with strong staining intensity and progesterone receptor, 0% negative. HER2 equivocal by immunohistochemistry (2+), but negative by fluorescent in situ hybridization with a signals ratio 1.5 and number per cell 3.8.  2. Right Breast, upper-outer quadrant, axillary tail - invasive ductal carcinoma, grade 2 - ductal carcinoma in situ, grade 2-3 - Prognostic indicators significant for: estrogen receptor, 100% positive and progesterone receptor, 50% positive, both with strong staining intensity. HER2 equivocal by immunohistochemistry (2+), but negative by fluorescent in situ hybridization with a signals ratio 1.9 and number per cell 5.3.  The patient is a 64 year old female.   Past Surgical History Paige Gilbert, Paige Gilbert; 08/09/2019 2:20 PM) Oral Surgery  Diagnostic Studies History Paige Gilbert, Paige Gilbert; 08/09/2019 2:20 PM) Colonoscopy never Mammogram within last year Pap Smear >5 years ago  Allergies Paige Gilbert, CMA; 08/09/2019 2:22 PM) No Known Drug Allergies [08/09/2019]: Allergies Reconciled  Medication History Paige Gilbert, Paige Gilbert; 08/09/2019 2:22 PM) Medications Reconciled  Social History Paige Gilbert, Paige Gilbert; 08/09/2019 2:20 PM) Alcohol use Occasional alcohol use. Caffeine use Coffee, Tea. No drug use Tobacco use Never smoker.  Family History Paige Gilbert, Paige Gilbert; 08/09/2019 2:20 PM) Arthritis Mother. Cerebrovascular Accident Father, Mother. Diabetes Mellitus Father, Sister. Heart Disease Brother, Father, Sister. Heart disease in female family member before age 47 Hypertension Brother, Father. Migraine Headache  Daughter. Respiratory Condition Daughter.  Pregnancy / Birth History Paige Gilbert, Paige Gilbert; 08/09/2019 2:20 PM) Age at menarche 70 years. Age of menopause 29-50 Gravida 5 Length (months) of  breastfeeding 12-24 Maternal age 28-25 Para 41  Other Problems Paige Gilbert, Paige Gilbert; 08/09/2019 2:20 PM) Breast Cancer Hypercholesterolemia Lump In Breast Vascular Disease     Review of Systems Paige Gilbert CMA; 08/09/2019 2:20 PM) General Not Present- Appetite Loss, Chills, Fatigue, Fever, Night Sweats, Weight Gain and Weight Loss. Skin Not Present- Change in Wart/Mole, Dryness, Hives, Jaundice, New Lesions, Non-Healing Wounds, Rash and Ulcer. HEENT Present- Wears glasses/contact lenses. Not Present- Earache, Hearing Loss, Hoarseness, Nose Bleed, Oral Ulcers, Ringing in the Ears, Seasonal Allergies, Sinus Pain, Sore Throat, Visual Disturbances and Yellow Eyes. Respiratory Not Present- Bloody sputum, Chronic Cough, Difficulty Breathing, Snoring and Wheezing. Breast Present- Breast Mass and Skin Changes. Not Present- Breast Pain and Nipple Discharge. Cardiovascular Not Present- Chest Pain, Difficulty Breathing Lying Down, Leg Cramps, Palpitations, Rapid Heart Rate, Shortness of Breath and Swelling of Extremities. Gastrointestinal Not Present- Abdominal Pain, Bloating, Bloody Stool, Change in Bowel Habits, Chronic diarrhea, Constipation, Difficulty Swallowing, Excessive gas, Gets full quickly at meals, Hemorrhoids, Indigestion, Nausea, Rectal Pain and Vomiting. Female Genitourinary Not Present- Frequency, Nocturia, Painful Urination, Pelvic Pain and Urgency. Musculoskeletal Not Present- Back Pain, Joint Pain, Joint Stiffness, Muscle Pain, Muscle Weakness and Swelling of Extremities. Neurological Not Present- Decreased Memory, Fainting, Headaches, Numbness, Seizures, Tingling, Tremor, Trouble walking and Weakness. Psychiatric Not Present- Anxiety, Bipolar, Change in Sleep Pattern,  Depression, Fearful and Frequent crying. Endocrine Not Present- Cold Intolerance, Excessive Hunger, Hair Changes, Heat Intolerance, Hot flashes and New Diabetes. Hematology Not Present- Blood Thinners, Easy Bruising, Excessive bleeding, Gland problems, HIV and Persistent Infections.  Vitals Paige Gilbert CMA; 08/09/2019 2:22 PM) 08/09/2019 2:21 PM Weight: 140.4 lb Height: 64.5in Body Surface Area: 1.69 m Body Mass Index: 23.73 kg/m  Temp.: 97.67F  Pulse: 91 (Regular)  BP: 122/72 (Sitting, Left Arm, Standard)        Physical Exam (Paige Goelz A. Deakin Lacek Paige Gilbert; 08/09/2019 2:48 PM)  General Mental Status-Alert. General Appearance-Consistent with stated age. Hydration-Well hydrated. Voice-Normal.  Eye Eyeball - Bilateral-Extraocular movements intact. Sclera/Conjunctiva - Bilateral-No scleral icterus.  Breast Note: Right breast shows a nipple that is pulled toward the right. There is a hard mass at the nipple and a second hard mass involving the right upper quadrant. Left breast is without mass lesion.  Neurologic Neurologic evaluation reveals -alert and oriented x 3 with no impairment of recent or remote memory. Mental Status-Normal.  Musculoskeletal Normal Exam - Left-Upper Extremity Strength Normal and Lower Extremity Strength Normal. Normal Exam - Right-Upper Extremity Strength Normal and Lower Extremity Strength Normal.  Lymphatic Head & Neck  General Head & Neck Lymphatics: Bilateral - Description - Normal. Axillary -Note:Patient with bulky right axillary adenopathy. It is not fixed.     Assessment & Plan (Paige Pfefferle A. Clariece Roesler Paige Gilbert; 08/09/2019 2:49 PM)  BREAST CANCER, RIGHT (C50.911) Impression: Locally advanced Recommend port placement for chemotherapy. Discussed different techniques of intravenous access. Discussed port placement, palpitations, long-term expectations and potential pitfalls of using a port. Pt requires port placement for  chemotherapy. Risk include bleeding, infection, pneumothorax, hemothorax, mediastinal injury, nerve injury , blood vessel injury, stroke, blood clots, death, migration. embolization and need for additional procedures. Pt agrees to proceed.    Total time 45 minutes  Current Plans Use of a central venous catheter for intravenous therapy was discussed. Technique of catheter placement using ultrasound and fluoroscopy guidance was discussed. Risks such as bleeding, infection, pneumothorax, catheter occlusion, reoperation, and other risks were discussed. I noted a good likelihood this will help address the problem. Questions were answered.  The patient expressed understanding & wishes to proceed. Pt Education - CCS Free Text Education/Instructions: discussed with patient and provided information. You are being scheduled for surgery- Our schedulers will call you.  You should hear from our office's scheduling department within 5 working days about the location, date, and time of surgery. We try to make accommodations for patient's preferences in scheduling surgery, but sometimes the OR schedule or the surgeon's schedule prevents [place] from making those accommodations.  If you have not heard from our office [phone]) in 5 working days, call the office and ask for your surgeon's nurse.  If you have other questions about your diagnosis, plan, or surgery, call the office and ask for your surgeon's nurse.

## 2019-08-09 NOTE — H&P (View-Only) (Signed)
Paige Gilbert Documented: 08/09/2019 2:20 PM Location: [facility] Patient #: [id number] DOB: April 04, 1956 Married / Language: English / Race: White Female  History of Present Illness Paige Moores A. Thayne Cindric MD; 08/09/2019 2:46 PM) Patient words: Patient sent at the request of Dr. Jana Hakim of oncology due to a recently diagnosed right breast cancer. Patient relates a multi-month history of hardening of the right breast with contraction of the nipple. She went to a Facility and underwent workup which included bilateral diagnostic mammography and right breast ultrasound last month. She had multifocal lesions in the upper outer quadrant near the nipple and up in the axillary tail. Core biopsy showed invasive ductal carcinoma grade 2 with DCIS as she receptor positive progesterone receptor negative and equivocal HER-2/neu. Second area more toward the tail had a similar pattern except this was progesterone receptor positive. She had a maternal aunt with breast cancer. She has had no drainage. She is in need of chemotherapy and is here today to talk about port placement and subsequent surgery down the road.       per oncology    Paige Gilbert has noted a difference between her right and left breast for several years. She last had a mammogram she thinks about 7 or 8 years ago. More recently it seemed to her that her right breast was becoming more firm. She brought it to medical attention and underwent bilateral diagnostic mammography with tomography and right breast ultrasonography at [facility] on 07/07/2019 showing: heterogeneously dense breast tissue; right nipple retraction; architectural distortion with microcalcifications and irregular dense tissue in the upper-outer quadrant; at least 3 abnormal right axillary lymph nodes.  Accordingly on 07/13/2019 she proceeded to biopsy of the right breast area in question. The pathology from this procedure ([id number]) showed:  1.  Right Breast, upper-outer quadrant, closer to the nipple - invasive ductal carcinoma with micropapillary features, grade 2 - ductal carcinoma in situ, grade 2-3, solid and cribriform pattern with comedonecrosis - Prognostic indicators significant for: estrogen receptor, 100% positive with strong staining intensity and progesterone receptor, 0% negative. HER2 equivocal by immunohistochemistry (2+), but negative by fluorescent in situ hybridization with a signals ratio 1.5 and number per cell 3.8.  2. Right Breast, upper-outer quadrant, axillary tail - invasive ductal carcinoma, grade 2 - ductal carcinoma in situ, grade 2-3 - Prognostic indicators significant for: estrogen receptor, 100% positive and progesterone receptor, 50% positive, both with strong staining intensity. HER2 equivocal by immunohistochemistry (2+), but negative by fluorescent in situ hybridization with a signals ratio 1.9 and number per cell 5.3.  The patient is a 64 year old female.   Past Surgical History Paige Gilbert, [place]; 08/09/2019 2:20 PM) Oral Surgery  Diagnostic Studies History Paige Gilbert, [place]; 08/09/2019 2:20 PM) Colonoscopy never Mammogram within last year Pap Smear >5 years ago  Allergies Paige Gilbert, CMA; 08/09/2019 2:22 PM) No Known Drug Allergies [08/09/2019]: Allergies Reconciled  Medication History Paige Gilbert, [place]; 08/09/2019 2:22 PM) Medications Reconciled  Social History Paige Gilbert, [place]; 08/09/2019 2:20 PM) Alcohol use Occasional alcohol use. Caffeine use Coffee, Tea. No drug use Tobacco use Never smoker.  Family History Paige Gilbert, [place]; 08/09/2019 2:20 PM) Arthritis Mother. Cerebrovascular Accident Father, Mother. Diabetes Mellitus Father, Sister. Heart Disease Brother, Father, Sister. Heart disease in female family member before age 53 Hypertension Brother, Father. Migraine Headache  Daughter. Respiratory Condition Daughter.  Pregnancy / Birth History Paige Gilbert, [place]; 08/09/2019 2:20 PM) Age at menarche 35 years. Age of menopause 63-50 Gravida 5 Length (months) of  breastfeeding 12-24 Maternal age 5-25 Para 38  Other Problems Paige Gilbert, [place]; 08/09/2019 2:20 PM) Breast Cancer Hypercholesterolemia Lump In Breast Vascular Disease     Review of Systems Paige Gilbert CMA; 08/09/2019 2:20 PM) General Not Present- Appetite Loss, Chills, Fatigue, Fever, Night Sweats, Weight Gain and Weight Loss. Skin Not Present- Change in Wart/Mole, Dryness, Hives, Jaundice, New Lesions, Non-Healing Wounds, Rash and Ulcer. HEENT Present- Wears glasses/contact lenses. Not Present- Earache, Hearing Loss, Hoarseness, Nose Bleed, Oral Ulcers, Ringing in the Ears, Seasonal Allergies, Sinus Pain, Sore Throat, Visual Disturbances and Yellow Eyes. Respiratory Not Present- Bloody sputum, Chronic Cough, Difficulty Breathing, Snoring and Wheezing. Breast Present- Breast Mass and Skin Changes. Not Present- Breast Pain and Nipple Discharge. Cardiovascular Not Present- Chest Pain, Difficulty Breathing Lying Down, Leg Cramps, Palpitations, Rapid Heart Rate, Shortness of Breath and Swelling of Extremities. Gastrointestinal Not Present- Abdominal Pain, Bloating, Bloody Stool, Change in Bowel Habits, Chronic diarrhea, Constipation, Difficulty Swallowing, Excessive gas, Gets full quickly at meals, Hemorrhoids, Indigestion, Nausea, Rectal Pain and Vomiting. Female Genitourinary Not Present- Frequency, Nocturia, Painful Urination, Pelvic Pain and Urgency. Musculoskeletal Not Present- Back Pain, Joint Pain, Joint Stiffness, Muscle Pain, Muscle Weakness and Swelling of Extremities. Neurological Not Present- Decreased Memory, Fainting, Headaches, Numbness, Seizures, Tingling, Tremor, Trouble walking and Weakness. Psychiatric Not Present- Anxiety, Bipolar, Change in Sleep Pattern,  Depression, Fearful and Frequent crying. Endocrine Not Present- Cold Intolerance, Excessive Hunger, Hair Changes, Heat Intolerance, Hot flashes and New Diabetes. Hematology Not Present- Blood Thinners, Easy Bruising, Excessive bleeding, Gland problems, HIV and Persistent Infections.  Vitals Paige Gilbert CMA; 08/09/2019 2:22 PM) 08/09/2019 2:21 PM Weight: 140.4 lb Height: 64.5in Body Surface Area: 1.69 m Body Mass Index: 23.73 kg/m  Temp.: 97.38F  Pulse: 91 (Regular)  BP: 122/72 (Sitting, Left Arm, Standard)        Physical Exam (Paige Cygan A. Haislee Corso MD; 08/09/2019 2:48 PM)  General Mental Status-Alert. General Appearance-Consistent with stated age. Hydration-Well hydrated. Voice-Normal.  Eye Eyeball - Bilateral-Extraocular movements intact. Sclera/Conjunctiva - Bilateral-No scleral icterus.  Breast Note: Right breast shows a nipple that is pulled toward the right. There is a hard mass at the nipple and a second hard mass involving the right upper quadrant. Left breast is without mass lesion.  Neurologic Neurologic evaluation reveals -alert and oriented x 3 with no impairment of recent or remote memory. Mental Status-Normal.  Musculoskeletal Normal Exam - Left-Upper Extremity Strength Normal and Lower Extremity Strength Normal. Normal Exam - Right-Upper Extremity Strength Normal and Lower Extremity Strength Normal.  Lymphatic Head & Neck  General Head & Neck Lymphatics: Bilateral - Description - Normal. Axillary -Note:Patient with bulky right axillary adenopathy. It is not fixed.     Assessment & Plan (Paige Madole A. Oveda Dadamo MD; 08/09/2019 2:49 PM)  BREAST CANCER, RIGHT (C50.911) Impression: Locally advanced Recommend port placement for chemotherapy. Discussed different techniques of intravenous access. Discussed port placement, palpitations, long-term expectations and potential pitfalls of using a port. Pt requires port placement for  chemotherapy. Risk include bleeding, infection, pneumothorax, hemothorax, mediastinal injury, nerve injury , blood vessel injury, stroke, blood clots, death, migration. embolization and need for additional procedures. Pt agrees to proceed.    Total time 45 minutes  Current Plans Use of a central venous catheter for intravenous therapy was discussed. Technique of catheter placement using ultrasound and fluoroscopy guidance was discussed. Risks such as bleeding, infection, pneumothorax, catheter occlusion, reoperation, and other risks were discussed. I noted a good likelihood this will help address the problem. Questions were answered.  The patient expressed understanding & wishes to proceed. Pt Education - CCS Free Text Education/Instructions: discussed with patient and provided information. You are being scheduled for surgery- Our schedulers will call you.  You should hear from our office's scheduling department within 5 working days about the location, date, and time of surgery. We try to make accommodations for patient's preferences in scheduling surgery, but sometimes the OR schedule or the surgeon's schedule prevents [place] from making those accommodations.  If you have not heard from our office [phone]) in 5 working days, call the office and ask for your surgeon's nurse.  If you have other questions about your diagnosis, plan, or surgery, call the office and ask for your surgeon's nurse.

## 2019-08-10 ENCOUNTER — Ambulatory Visit
Admission: RE | Admit: 2019-08-10 | Discharge: 2019-08-10 | Disposition: A | Discharge status: Home / Self Care | Payer: Managed Care, Other (non HMO) | Source: Ambulatory Visit | Attending: Radiation Oncology | Admitting: Radiation Oncology

## 2019-08-10 ENCOUNTER — Ambulatory Visit (HOSPITAL_COMMUNITY)
Admission: RE | Admit: 2019-08-10 | Discharge: 2019-08-10 | Disposition: A | Discharge status: Home / Self Care | Payer: Managed Care, Other (non HMO) | Source: Ambulatory Visit | Attending: Oncology | Admitting: Oncology

## 2019-08-10 ENCOUNTER — Other Ambulatory Visit: Payer: Self-pay

## 2019-08-10 ENCOUNTER — Encounter: Payer: Self-pay | Admitting: *Deleted

## 2019-08-10 ENCOUNTER — Encounter (HOSPITAL_COMMUNITY): Payer: Self-pay

## 2019-08-10 ENCOUNTER — Encounter: Payer: Self-pay | Admitting: Radiation Oncology

## 2019-08-10 DIAGNOSIS — Z01818 Encounter for other preprocedural examination: Secondary | ICD-10-CM | POA: Diagnosis present

## 2019-08-10 DIAGNOSIS — C50411 Malignant neoplasm of upper-outer quadrant of right female breast: Secondary | ICD-10-CM | POA: Insufficient documentation

## 2019-08-10 DIAGNOSIS — Z170 Estrogen receptor positive status [ER+]: Secondary | ICD-10-CM | POA: Diagnosis not present

## 2019-08-10 HISTORY — DX: Malignant (primary) neoplasm, unspecified: C80.1

## 2019-08-10 MED ORDER — SODIUM CHLORIDE (PF) 0.9 % IJ SOLN
INTRAMUSCULAR | Status: AC
  Filled 2019-08-10: qty 50

## 2019-08-10 MED ORDER — IOHEXOL 300 MG/ML  SOLN
75.0000 mL | Freq: Once | INTRAMUSCULAR | Status: AC | PRN
  Administered 2019-08-10: 09:00:00 75 mL via INTRAVENOUS

## 2019-08-10 NOTE — Progress Notes (Signed)
  Echocardiogram 2D Echocardiogram has been performed.  Paige Gilbert 08/10/2019, 11:34 AM

## 2019-08-10 NOTE — Progress Notes (Signed)
Radiation Oncology         [phone] ________________________________  Initial outpatient MyChart Consultation (by MyChart video during pandemic precautions)   Name: Paige Gilbert MRN: [id number]  Date: 08/10/2019  DOB: Jan 13, 1956  CC:Paige Booze, NP  Magrinat, [name], MD   REFERRING PHYSICIAN: Magrinat, [name], MD  DIAGNOSIS:    ICD-10-CM   1. Malignant neoplasm of upper-outer quadrant of right breast in female, estrogen receptor positive ([place])  C50.411    Z17.0     Stage III (T3-4, N1) Right Breast UOQ Invasive Ductal Carcinoma with DCIS, ER+ / PR (positive and negative per two different biopsies) / Her2-, Grade 2  CHIEF COMPLAINT: Here to discuss management of right breast cancer  HISTORY OF PRESENT ILLNESS::Paige Gilbert is a 64 y.o. female who presented with right breast firmness. She first noted the difference several years ago. She had not undergone mammography for about 7 or 8 years.  [facility] mammogram 07/07/19 showed abnormal density, architectural distortion and numerous microcalcifications are present in the right breast from the retracted nipple all the way into the axilla where the abnormally dense tissue is essentially contiguous with one of several abnormal lymph nodes. [facility] Ultrasound of breast on 07/07/2019 revealed: right nipple retraction; architectural distortion with microcalcifications and irregular dense tissue in the upper-outer quadrant; at least 3 abnormal right axillary lymph nodes.   Biopsy x2 on date of 07/13/2019 showed (per [facility] path report) invasive ductal carcinoma with DCIS.  ER status: positive; PR status positive and negative per two different biopsies , Her2 status negative; Grade 2.  She met with Dr. Jana Gilbert on 08/04/2019, who recommended chemotherapy consisting of cyclophosphamide and doxorubicin followed by weekly paclitaxel, as well as port placement, chest CT, bone scan, and breast MRI.   She underwent chest CT earlier today.  Negative for lung metastases.  She is scheduled for breast MRI on 08/15/2019. Bone scan has not yet been scheduled.   Dr. Jana Gilbert anticipates initiating chemotherapy on 08/17/2019.  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   PREVIOUS RADIATION THERAPY: No  PAST MEDICAL HISTORY:  has a past medical history of Anemia, right breast ca (dx'd 06/2019), and Varicose vein.    PAST SURGICAL HISTORY: Past Surgical History:  Procedure Laterality Date  . none    . wisdom teeth removal     remotely.     FAMILY HISTORY: family history includes Blindness in her mother; Colitis in her sister; Congestive Heart Failure in her sister; Diabetes in her father and sister; Gallbladder disease in her sister.  SOCIAL HISTORY:  reports that she has never smoked. She has never used smokeless tobacco. She reports current alcohol use. She reports that she does not use drugs.  ALLERGIES: Patient has no known allergies.  MEDICATIONS:  Current Outpatient Medications  Medication Sig Dispense Refill  . Calcium Citrate-Vitamin D (CALCIUM CITRATE + D PO) Take by mouth 2 (two) times daily as needed.     No current facility-administered medications for this encounter.    REVIEW OF SYSTEMS: As above   PHYSICAL EXAM:  vitals were not taken for this visit.   General: Alert and oriented, in no acute distress     LABORATORY DATA:  Lab Results  Component Value Date   WBC 5.7 08/04/2019   HGB 13.1 08/04/2019   HCT 38.8 08/04/2019   MCV 86.0 08/04/2019   PLT 177 08/04/2019   CMP     Component Value Date/Time  NA 139 08/04/2019 1546   K 4.3 08/04/2019 1546   CL 105 08/04/2019 1546   CO2 25 08/04/2019 1546   GLUCOSE 104 (H) 08/04/2019 1546   BUN 25 (H) 08/04/2019 1546   CREATININE 0.79 08/04/2019 1546   CREATININE 0.76 03/27/2015 1146   CALCIUM 8.9 08/04/2019 1546   PROT 7.0 08/04/2019 1546   ALBUMIN 4.2 08/04/2019 1546   AST 17 08/04/2019 1546   ALT 21  08/04/2019 1546   ALKPHOS 67 08/04/2019 1546   BILITOT 0.2 (L) 08/04/2019 1546   GFRNONAA >60 08/04/2019 1546   GFRNONAA 87 03/27/2015 1146   GFRAA >60 08/04/2019 1546   GFRAA >89 03/27/2015 1146         RADIOGRAPHY: CT Chest W Contrast  Result Date: 08/10/2019 CLINICAL DATA:  RT breast cancer diagnosed 12/20, surgery, chemo, XRT and oral chemo has all been planned, but not started yet. EXAM: CT CHEST WITH CONTRAST TECHNIQUE: Multidetector CT imaging of the chest was performed during intravenous contrast administration. CONTRAST:  7m OMNIPAQUE IOHEXOL 300 MG/ML  SOLN COMPARISON:  None. FINDINGS: Cardiovascular: No significant vascular findings. Normal heart size. No pericardial effusion. Mediastinum/Nodes: Mass within the posterior aspect of the RIGHT breast measures 2.6 by 1.7 cm. Adjacent high-density RIGHT axial lymph node measures 7 mm short axis (image 23/2). No internal mammary adenopathy. Supraclavicular or mediastinal adenopathy. Lungs/Pleura: Small 2 mm subpleural nodule in the LEFT upper lobe (image 54/4). Airways normal. Upper Abdomen: Limited view of the liver, kidneys, pancreas are unremarkable. Normal adrenal glands. Musculoskeletal: No aggressive osseous lesion. IMPRESSION: 1. Mass in the posterior aspect of the RIGHT breast consistent with primary breast carcinoma. 2. Adjacent RIGHT axial lymph node is concerning for nodal metastasis. 3. No central lymphadenopathy. 4. Small 2 mm subpleural nodule in the LEFT upper lobe is favored benign. Electronically Signed   By: SSuzy BouchardM.D.   On: 08/10/2019 09:21   ECHOCARDIOGRAM COMPLETE  Result Date: 08/10/2019   ECHOCARDIOGRAM REPORT   Patient Name:   Paige Gilbert Date of Exam: 08/10/2019 Medical Rec #:  [id number]        Height:       64.5 in Accession #:    [id number]       Weight:       141.0 lb Date of Birth:  9Sep 14, 1957        BSA:          1.70 m Patient Age:    89years          BP:           124/79 mmHg Patient Gender:  F                 HR:           60 bpm. Exam Location:  Outpatient Procedure: 2D Echo, Cardiac Doppler, Color Doppler and Strain Analysis Indications:    Z51.11 Encounter for antineoplastic chemotheraphy  History:        Patient has no prior history of Echocardiogram examinations.                 Breast Cancer.  Sonographer:    TJonelle SidleDance Referring Phys: [id number] 1. Left ventricular ejection fraction, by visual estimation, is 60 to 65%. The left ventricle has normal function. There is no left ventricular hypertrophy.  2. The left ventricle has no regional wall motion abnormalities.  3. The average left ventricular global longitudinal strain is -16.9 %.  4. Global right ventricle has normal systolic function.The right ventricular size is normal. No increase in right ventricular wall thickness.  5. Left atrial size was normal.  6. Right atrial size was normal.  7. The tricuspid valve is grossly normal.  8. The aortic valve is tricuspid. Aortic valve regurgitation is not visualized.  9. The pulmonic valve was grossly normal. Pulmonic valve regurgitation is not visualized. 10. Normal pulmonary artery systolic pressure. 11. The inferior vena cava is normal in size with greater than 50% respiratory variability, suggesting right atrial pressure of 3 mmHg. 12. The mitral valve is grossly normal. No evidence of mitral valve regurgitation. FINDINGS  Left Ventricle: Left ventricular ejection fraction, by visual estimation, is 60 to 65%. The left ventricle has normal function. The average left ventricular global longitudinal strain is -16.9 %. The left ventricle has no regional wall motion abnormalities. There is no left ventricular hypertrophy. Left ventricular diastolic parameters were normal. Normal left atrial pressure. Right Ventricle: The right ventricular size is normal. No increase in right ventricular wall thickness. Global RV systolic function is has normal systolic function. The  tricuspid regurgitant velocity is 2.03 m/s, and with an assumed right atrial pressure  of 3 mmHg, the estimated right ventricular systolic pressure is normal at 19.5 mmHg. Left Atrium: Left atrial size was normal in size. Right Atrium: Right atrial size was normal in size Pericardium: There is no evidence of pericardial effusion. Mitral Valve: The mitral valve is grossly normal. No evidence of mitral valve regurgitation. Tricuspid Valve: The tricuspid valve is grossly normal. Tricuspid valve regurgitation is trivial. Aortic Valve: The aortic valve is tricuspid. Aortic valve regurgitation is not visualized. Pulmonic Valve: The pulmonic valve was grossly normal. Pulmonic valve regurgitation is not visualized. Pulmonic regurgitation is not visualized. Aorta: The aortic root and ascending aorta are structurally normal, with no evidence of dilitation. Venous: The inferior vena cava is normal in size with greater than 50% respiratory variability, suggesting right atrial pressure of 3 mmHg. IAS/Shunts: No atrial level shunt detected by color flow Doppler.  LEFT VENTRICLE PLAX 2D LVIDd:         4.30 cm  Diastology LVIDs:         2.80 cm  LV e' lateral:   10.75 cm/s LV PW:         0.80 cm  LV E/e' lateral: 6.3 LV IVS:        0.70 cm  LV e' medial:    8.16 cm/s LVOT diam:     1.80 cm  LV E/e' medial:  8.2 LV SV:         54 ml LV SV Index:   31.23    2D Longitudinal Strain LVOT Area:     2.54 cm 2D Strain GLS Avg:     -16.9 %  RIGHT VENTRICLE             IVC RV Basal diam:  2.50 cm     IVC diam: 1.30 cm RV S prime:     11.70 cm/s TAPSE (M-mode): 2.4 cm LEFT ATRIUM             Index       RIGHT ATRIUM           Index LA diam:        3.00 cm 1.77 cm/m  RA Area:     14.20 cm LA Vol (A2C):   57.9 ml 34.15 ml/m RA Volume:   31.80 ml  18.76 ml/m LA  Vol (A4C):   26.3 ml 15.51 ml/m LA Biplane Vol: 40.0 ml 23.59 ml/m  AORTIC VALVE LVOT Vmax:   80.90 cm/s LVOT Vmean:  50.700 cm/s LVOT VTI:    0.172 m  AORTA Ao Root diam: 3.10 cm  Ao Asc diam:  2.70 cm MITRAL VALVE                        TRICUSPID VALVE MV Area (PHT): 4.21 cm             TR Peak grad:   16.5 mmHg MV PHT:        52.20 msec           TR Vmax:        203.00 cm/s MV Decel Time: 180 msec MV E velocity: 67.30 cm/s 103 cm/s  SHUNTS MV A velocity: 52.70 cm/s 70.3 cm/s Systemic VTI:  0.17 m MV E/A ratio:  1.28       1.5       Systemic Diam: 1.80 cm  Lyman Bishop MD Electronically signed by Lyman Bishop MD Signature Date/Time: 08/10/2019/1:38:37 PM    Final       IMPRESSION/PLAN: Right Breast Cancer   It was a pleasure meeting the patient today. We discussed the risks, benefits, and side effects of radiotherapy. I recommend radiotherapy to the right breast or chest wall and regional nodes (after chemotherapy and surgery) to reduce her risk of locoregional recurrence by 2/3.  We discussed that radiation would take approximately 6-7 weeks to complete and that I would give the patient a few weeks to heal following surgery before starting treatment planning.  We spoke about acute effects including skin irritation and fatigue as well as much less common late effects including internal organ injury or irritation. We spoke about the latest technology that is used to minimize the risk of late effects for patients undergoing radiotherapy to the breast or chest wall. No guarantees of treatment were given. The patient is enthusiastic about proceeding with treatment. I look forward to participating in the patient's care.  I will await her referral back to me for postoperative follow-up and eventual CT simulation/treatment planning.  We discussed measures to reduce the risk of infection during the COVID-19 pandemic. She states she does not depend upon the extra money she earns through her current work.  I recommended she strongly consider not working during chemotherapy given her immune system suppression from treatment and risk of catching COVID.  It would be good for her to discuss the  pros and cons further with Dr. Jana Gilbert if she is unsure.  This encounter was provided by telemedicine platform MyChart Video.  The patient has given verbal consent for this type of encounter and has been advised to only accept a meeting of this type in a secure network environment. The time spent during this encounter on date of service in total was 60 minutes. The attendants for this meeting include [name]  and Gaye Pollack.  During the encounter, [name] was located at [facility] Radiation Oncology Department.  Sofi Bryars was located at home.     __________________________________________   [name], MD   This document serves as a record of services personally performed by [name], MD. It was created on her behalf by Wilburn Mylar, a trained medical scribe. The creation of this record is based on the scribe's personal observations and the provider's statements to them. This document has been  checked and approved by the attending provider.

## 2019-08-11 ENCOUNTER — Encounter: Payer: Self-pay | Admitting: Radiation Oncology

## 2019-08-11 ENCOUNTER — Other Ambulatory Visit (HOSPITAL_COMMUNITY): Payer: Managed Care, Other (non HMO)

## 2019-08-11 NOTE — Progress Notes (Signed)
[place]  [phone] [phone]) [phone]     ID: Paige Gilbert DOB: [birth year]  MR#: [id number]  EXB#:[id number]  Patient Care Team: [name], NP as PCP - General (Family Medicine) [name], RN as Oncology Nurse Navigator [name], RN as Oncology Nurse Navigator Tempest Frankland, [name], MD as Consulting Physician (Oncology) [name], MD as Attending Physician (Radiation Oncology) [name], MD as Consulting Physician (General Surgery) [name], MD OTHER MD:  CHIEF COMPLAINT: Locally advanced estrogen receptor positive breast cancer  CURRENT TREATMENT: Neoadjuvant chemotherapy   INTERVAL HISTORY: Paige Gilbert returns today for follow up of her locally advanced breast cancer accompanied by her husband.  Since consultation, she underwent echocardiogram on 08/10/2019, which showed an ejection fraction of 60-65%.   She also underwent chest CT the same day, which showed: 2.6 cm mass in posterior right breast; 7 mm adjacent right axial lymph node concerning for metastasis; no central lymphadenopathy; likely-benign 2 mm subpleural left upper lobe nodule.  She also met with Dr. Isidore Moos via [place] on 08/10/2019. Recommendation is to proceed with radiation therapy to the right breast or chest wall and regional nodes following surgery.  She is scheduled to undergo breast MRI on 08/15/2019.  This will be her baseline study to be repeated at the end of neoadjuvant treatment  On 08/17/2019, she is scheduled to undergo port placement and to begin neoadjuvant chemotherapy consisting of cyclophosphamide and doxorubicin.   REVIEW OF SYSTEMS: Paige Gilbert is understandably anxious but is managing that well.  She canceled her bone scan because it had been scheduled on the same day as her planned port placement.  She was not aware that she would need to come back on day 3 of each of the first 4 cycles and in fact these have not been scheduled.   She does have an appointment with the chemotherapy teaching nurse.  She and her husband are taking appropriate pandemic precautions.  A detailed review of systems today was otherwise stable.   HISTORY OF CURRENT ILLNESS: From the original intake note:  Paige Gilbert has noted a difference between her right and left breast for several years.  She last had a mammogram she thinks about 7 or 8 years ago.  More recently it seemed to her that her right breast was becoming more firm.  She brought it to medical attention and underwent bilateral diagnostic mammography with tomography and right breast ultrasonography at [facility] on 07/07/2019 showing: heterogeneously dense breast tissue; right nipple retraction; architectural distortion with microcalcifications and irregular dense tissue in the upper-outer quadrant; at least 3 abnormal right axillary lymph nodes.  Accordingly on 07/13/2019 she proceeded to biopsy of the right breast area in question. The pathology from this procedure ([id number]) showed:   1. Right Breast, upper-outer quadrant, closer to the nipple  - invasive ductal carcinoma with micropapillary features, grade 2  - ductal carcinoma in situ, grade 2-3, solid and cribriform pattern with comedonecrosis  - Prognostic indicators significant for: estrogen receptor, 100% positive with strong staining intensity and progesterone receptor, 0% negative. HER2 equivocal by immunohistochemistry (2+), but negative by fluorescent in situ hybridization with a signals ratio 1.5 and number per cell 3.8.  2. Right Breast, upper-outer quadrant, axillary tail  - invasive ductal carcinoma, grade 2  - ductal carcinoma in situ, grade 2-3  - Prognostic indicators significant for: estrogen receptor, 100% positive and progesterone receptor, 50% positive, both with strong staining intensity. HER2 equivocal by immunohistochemistry (2+), but negative  by fluorescent in situ hybridization with a signals ratio 1.9  and number per cell 5.3.  The patient's subsequent history is as detailed below.   PAST MEDICAL HISTORY: Past Medical History:  Diagnosis Date  . Anemia   . right breast ca dx'd 06/2019  . Varicose vein     PAST SURGICAL HISTORY: Past Surgical History:  Procedure Laterality Date  . none    . wisdom teeth removal     remotely.     FAMILY HISTORY: Family History  Problem Relation Age of Onset  . Diabetes Father   . Blindness Mother   . Congestive Heart Failure Sister   . Gallbladder disease Sister        removed  . Diabetes Sister   . Colitis Sister    The patient's father died at age 44 from a stroke.  The patient's mother died from multiple medical problems not related to cancer at age 61.  The patient had 4 brothers, 7 sisters.  There is no breast ovarian or prostate cancer in the family to her knowledge.  She had one cousin with pancreatic cancer.  GYNECOLOGIC HISTORY:  No LMP recorded. Patient is postmenopausal. Menarche: 64 years old Age at first live birth: 64 years old [place] P 5 LMP 72 Contraceptive: Brief use of oral contraceptives as a teenager HRT no Hysterectomy?  No BSO?    SOCIAL HISTORY: (updated 07/2019)  Paige Gilbert works in a Engineer, agricultural, and Education administrator houses.  Her husband of 31 years [name] is a Chief Operating Officer.  Their children are [name], 42, who lives in [place] and is a Veterinary surgeon in the [employer]; [name] 33 who works as a Designer, jewellery at [employer]; [name] who lives in [place], and is not currently employed; Orange lives in [place], and [name] who is an Armed forces training and education officer in [place].  The patient has 4 grandchildren all under the age of 55    ADVANCED DIRECTIVES: Husband [name] is her HCPOA.   HEALTH MAINTENANCE: Social History   Tobacco Use  . Smoking status: Never Smoker  . Smokeless tobacco: Never Used  Substance Use Topics  . Alcohol use: Yes    Comment: rarely  . Drug use: No      Colonoscopy: never done  PAP: Remote  Bone density: Never   No Known Allergies  Current Outpatient Medications  Medication Sig Dispense Refill  . Calcium Citrate-Vitamin D (CALCIUM CITRATE + D PO) Take 1 tablet by mouth 2 (two) times daily.     Marland Kitchen dexamethasone (DECADRON) 4 MG tablet Take 2 tablets by mouth once a day on the day after chemotherapy and then take 2 tablets two times a day for 2 days. Take with food. 30 tablet 1  . lidocaine-prilocaine (EMLA) cream Apply to affected area once 30 g 3  . loratadine (CLARITIN) 10 MG tablet Take 1 tablet (10 mg total) by mouth daily. 60 tablet 1  . LORazepam (ATIVAN) 0.5 MG tablet Take 1 tablet (0.5 mg total) by mouth at bedtime as needed (Nausea or vomiting). 30 tablet 0  . prochlorperazine (COMPAZINE) 10 MG tablet Take 1 tablet (10 mg total) by mouth every 6 (six) hours as needed (Nausea or vomiting). 30 tablet 1   No current facility-administered medications for this visit.    OBJECTIVE: Middle-aged white woman who appears stated age  9:   08/12/19 1508  BP: 115/71  Pulse: 70  Resp: 18  Temp: (!) 97.3 F (36.3 C)  SpO2: 100%  Body mass index is 23.54 kg/m.   Wt Readings from Last 3 Encounters:  08/12/19 139 lb 4.8 oz (63.2 kg)  08/04/19 141 lb (64 kg)  02/15/15 134 lb (60.8 kg)      ECOG FS:1 - Symptomatic but completely ambulatory  Sclerae unicteric, EOMs intact Wearing a mask No cervical or supraclavicular adenopathy Lungs no rales or rhonchi Heart regular rate and rhythm Abd soft, nontender, positive bowel sounds MSK no focal spinal tenderness, no upper extremity lymphedema Neuro: nonfocal, well oriented, appropriate affect Breasts: The breast exam was similar to last visit, firm in the upper half with evidence of nipple retraction and skin involvement.  Right breast 08/13/2018     LAB RESULTS:  CMP     Component Value Date/Time   NA 139 08/04/2019 1546   K 4.3 08/04/2019 1546   CL 105  08/04/2019 1546   CO2 25 08/04/2019 1546   GLUCOSE 104 (H) 08/04/2019 1546   BUN 25 (H) 08/04/2019 1546   CREATININE 0.79 08/04/2019 1546   CREATININE 0.76 03/27/2015 1146   CALCIUM 8.9 08/04/2019 1546   PROT 7.0 08/04/2019 1546   ALBUMIN 4.2 08/04/2019 1546   AST 17 08/04/2019 1546   ALT 21 08/04/2019 1546   ALKPHOS 67 08/04/2019 1546   BILITOT 0.2 (L) 08/04/2019 1546   GFRNONAA >60 08/04/2019 1546   GFRNONAA 87 03/27/2015 1146   GFRAA >60 08/04/2019 1546   GFRAA >89 03/27/2015 1146    No results found for: TOTALPROTELP, ALBUMINELP, A1GS, A2GS, BETS, BETA2SER, GAMS, MSPIKE, SPEI  Lab Results  Component Value Date   WBC 5.7 08/04/2019   NEUTROABS 3.3 08/04/2019   HGB 13.1 08/04/2019   HCT 38.8 08/04/2019   MCV 86.0 08/04/2019   PLT 177 08/04/2019    No results found for: LABCA2  No components found for: JMEQAS341  No results for input(s): INR in the last 168 hours.  No results found for: LABCA2  No results found for: DQQ229  No results found for: NLG921  No results found for: JHE174  No results found for: CA2729  No components found for: HGQUANT  No results found for: CEA1 / No results found for: CEA1   No results found for: AFPTUMOR  No results found for: CHROMOGRNA  No results found for: KPAFRELGTCHN, LAMBDASER, KAPLAMBRATIO (kappa/lambda light chains)  No results found for: HGBA, HGBA2QUANT, HGBFQUANT, HGBSQUAN (Hemoglobinopathy evaluation)   No results found for: LDH  Lab Results  Component Value Date   IRON 94 02/15/2015   TIBC 272 02/15/2015   IRONPCTSAT 35 02/15/2015   (Iron and TIBC)  Lab Results  Component Value Date   FERRITIN 70 02/15/2015    Urinalysis No results found for: COLORURINE, APPEARANCEUR, LABSPEC, PHURINE, GLUCOSEU, HGBUR, BILIRUBINUR, KETONESUR, PROTEINUR, UROBILINOGEN, NITRITE, LEUKOCYTESUR   STUDIES: CT Chest W Contrast  Result Date: 08/10/2019 CLINICAL DATA:  RT breast cancer diagnosed 12/20, surgery, chemo,  XRT and oral chemo has all been planned, but not started yet. EXAM: CT CHEST WITH CONTRAST TECHNIQUE: Multidetector CT imaging of the chest was performed during intravenous contrast administration. CONTRAST:  80m OMNIPAQUE IOHEXOL 300 MG/ML  SOLN COMPARISON:  None. FINDINGS: Cardiovascular: No significant vascular findings. Normal heart size. No pericardial effusion. Mediastinum/Nodes: Mass within the posterior aspect of the RIGHT breast measures 2.6 by 1.7 cm. Adjacent high-density RIGHT axial lymph node measures 7 mm short axis (image 23/2). No internal mammary adenopathy. Supraclavicular or mediastinal adenopathy. Lungs/Pleura: Small 2 mm subpleural nodule in the LEFT upper lobe (image  54/4). Airways normal. Upper Abdomen: Limited view of the liver, kidneys, pancreas are unremarkable. Normal adrenal glands. Musculoskeletal: No aggressive osseous lesion. IMPRESSION: 1. Mass in the posterior aspect of the RIGHT breast consistent with primary breast carcinoma. 2. Adjacent RIGHT axial lymph node is concerning for nodal metastasis. 3. No central lymphadenopathy. 4. Small 2 mm subpleural nodule in the LEFT upper lobe is favored benign. Electronically Signed   By: Suzy Bouchard M.D.   On: 08/10/2019 09:21   ECHOCARDIOGRAM COMPLETE  Result Date: 08/10/2019   ECHOCARDIOGRAM REPORT   Patient Name:   Paige Gilbert Date of Exam: 08/10/2019 Medical Rec #:  [id number]         Height:       64.5 in Accession #:    [id number]        Weight:       141.0 lb Date of Birth:  1955/08/31         BSA:          1.70 m Patient Age:    64 years          BP:           124/79 mmHg Patient Gender: F                 HR:           60 bpm. Exam Location:  Outpatient Procedure: 2D Echo, Cardiac Doppler, Color Doppler and Strain Analysis Indications:    Z51.11 Encounter for antineoplastic chemotheraphy  History:        Patient has no prior history of Echocardiogram examinations.                 Breast Cancer.  Sonographer:    Jonelle Sidle  Dance Referring Phys: Brady  1. Left ventricular ejection fraction, by visual estimation, is 60 to 65%. The left ventricle has normal function. There is no left ventricular hypertrophy.  2. The left ventricle has no regional wall motion abnormalities.  3. The average left ventricular global longitudinal strain is -16.9 %.  4. Global right ventricle has normal systolic function.The right ventricular size is normal. No increase in right ventricular wall thickness.  5. Left atrial size was normal.  6. Right atrial size was normal.  7. The tricuspid valve is grossly normal.  8. The aortic valve is tricuspid. Aortic valve regurgitation is not visualized.  9. The pulmonic valve was grossly normal. Pulmonic valve regurgitation is not visualized. 10. Normal pulmonary artery systolic pressure. 11. The inferior vena cava is normal in size with greater than 50% respiratory variability, suggesting right atrial pressure of 3 mmHg. 12. The mitral valve is grossly normal. No evidence of mitral valve regurgitation. FINDINGS  Left Ventricle: Left ventricular ejection fraction, by visual estimation, is 60 to 65%. The left ventricle has normal function. The average left ventricular global longitudinal strain is -16.9 %. The left ventricle has no regional wall motion abnormalities. There is no left ventricular hypertrophy. Left ventricular diastolic parameters were normal. Normal left atrial pressure. Right Ventricle: The right ventricular size is normal. No increase in right ventricular wall thickness. Global RV systolic function is has normal systolic function. The tricuspid regurgitant velocity is 2.03 m/s, and with an assumed right atrial pressure  of 3 mmHg, the estimated right ventricular systolic pressure is normal at 19.5 mmHg. Left Atrium: Left atrial size was normal in size. Right Atrium: Right atrial size was normal in size Pericardium: There is no evidence of  pericardial effusion. Mitral Valve:  The mitral valve is grossly normal. No evidence of mitral valve regurgitation. Tricuspid Valve: The tricuspid valve is grossly normal. Tricuspid valve regurgitation is trivial. Aortic Valve: The aortic valve is tricuspid. Aortic valve regurgitation is not visualized. Pulmonic Valve: The pulmonic valve was grossly normal. Pulmonic valve regurgitation is not visualized. Pulmonic regurgitation is not visualized. Aorta: The aortic root and ascending aorta are structurally normal, with no evidence of dilitation. Venous: The inferior vena cava is normal in size with greater than 50% respiratory variability, suggesting right atrial pressure of 3 mmHg. IAS/Shunts: No atrial level shunt detected by color flow Doppler.  LEFT VENTRICLE PLAX 2D LVIDd:         4.30 cm  Diastology LVIDs:         2.80 cm  LV e' lateral:   10.75 cm/s LV PW:         0.80 cm  LV E/e' lateral: 6.3 LV IVS:        0.70 cm  LV e' medial:    8.16 cm/s LVOT diam:     1.80 cm  LV E/e' medial:  8.2 LV SV:         54 ml LV SV Index:   31.23    2D Longitudinal Strain LVOT Area:     2.54 cm 2D Strain GLS Avg:     -16.9 %  RIGHT VENTRICLE             IVC RV Basal diam:  2.50 cm     IVC diam: 1.30 cm RV S prime:     11.70 cm/s TAPSE (M-mode): 2.4 cm LEFT ATRIUM             Index       RIGHT ATRIUM           Index LA diam:        3.00 cm 1.77 cm/m  RA Area:     14.20 cm LA Vol (A2C):   57.9 ml 34.15 ml/m RA Volume:   31.80 ml  18.76 ml/m LA Vol (A4C):   26.3 ml 15.51 ml/m LA Biplane Vol: 40.0 ml 23.59 ml/m  AORTIC VALVE LVOT Vmax:   80.90 cm/s LVOT Vmean:  50.700 cm/s LVOT VTI:    0.172 m  AORTA Ao Root diam: 3.10 cm Ao Asc diam:  2.70 cm MITRAL VALVE                        TRICUSPID VALVE MV Area (PHT): 4.21 cm             TR Peak grad:   16.5 mmHg MV PHT:        52.20 msec           TR Vmax:        203.00 cm/s MV Decel Time: 180 msec MV E velocity: 67.30 cm/s 103 cm/s  SHUNTS MV A velocity: 52.70 cm/s 70.3 cm/s Systemic VTI:  0.17 m MV E/A ratio:  1.28        1.5       Systemic Diam: 1.80 cm  Lyman Bishop MD Electronically signed by Lyman Bishop MD Signature Date/Time: 08/10/2019/1:38:37 PM    Final     ELIGIBLE FOR AVAILABLE RESEARCH PROTOCOL:   ASSESSMENT: 64 y.o. [place], [place] woman status post right breast biopsy x2 on 07/13/2019 for a clinical T3-T4 N1, stage III invasive ductal carcinoma, grade 2, estrogen receptor positive, progesterone receptor variable,  HER-2 not amplified.  (1) neoadjuvant chemotherapy to consist of cyclophosphamide and doxorubicin in dose dense fashion x4 starting 08/18/2019 to be followed by weekly paclitaxel x12  (2) definitive surgery to follow  (3) adjuvant radiation as appropriate  (4) antiestrogens to start at the completion of local treatment   PLAN: Paige Gilbert is preparing herself for treatment and I think emotionally she is doing generally well.  We reviewed her CT of the chest which shows no evidence of bony involvement.  I emphasized that this includes ribs sternum and a lot of the spine.  Accordingly I anticipate the bone scan will be negative, but we do need to obtain that before the start of chemo if possible.  We also reviewed her echo which was excellent with a very good ejection fraction.  She understands it would be very abnormal if the ejection fraction were above 70%.  I gave her a copy of the routing sheet and how to read it.  I also entered all the supportive drugs that she will be taking.  She can get these before she meets with our chemotherapy teaching nurse and likely she will have a more questions after seeing some of the handouts she is likely to receive.  For some reason she has not been scheduled for the day 3 shots and I will make sure that gets done.  I will also see her again a week after her first treatment to troubleshoot any problems that may develop with the chemo and resolve them if possible before subsequent treatments  Total encounter time 30 minutes.[name],  MD   08/12/2019 5:23 PM Medical Oncology and Hematology [facility] [place],  [address] Tel. [phone]    Fax. [phone]   This document serves as a record of services personally performed by Lurline Del, MD. It was created on his behalf by Wilburn Mylar, a trained medical scribe. The creation of this record is based on the scribe's personal observations and the provider's statements to them.   I, Lurline Del MD, have reviewed the above documentation for accuracy and completeness, and I agree with the above.

## 2019-08-12 ENCOUNTER — Other Ambulatory Visit: Payer: Self-pay

## 2019-08-12 ENCOUNTER — Inpatient Hospital Stay (HOSPITAL_BASED_OUTPATIENT_CLINIC_OR_DEPARTMENT_OTHER): Payer: Managed Care, Other (non HMO) | Admitting: Oncology

## 2019-08-12 ENCOUNTER — Encounter: Payer: Self-pay | Admitting: Oncology

## 2019-08-12 VITALS — BP 115/71 | HR 70 | Temp 97.3°F | Resp 18 | Ht 64.5 in | Wt 139.3 lb

## 2019-08-12 DIAGNOSIS — C50411 Malignant neoplasm of upper-outer quadrant of right female breast: Secondary | ICD-10-CM | POA: Diagnosis not present

## 2019-08-12 DIAGNOSIS — Z5111 Encounter for antineoplastic chemotherapy: Secondary | ICD-10-CM | POA: Diagnosis not present

## 2019-08-12 DIAGNOSIS — Z170 Estrogen receptor positive status [ER+]: Secondary | ICD-10-CM | POA: Diagnosis not present

## 2019-08-12 MED ORDER — LIDOCAINE-PRILOCAINE 2.5-2.5 % EX CREA: TOPICAL_CREAM | CUTANEOUS | 3 refills | Status: DC

## 2019-08-12 MED ORDER — LORATADINE 10 MG PO TABS: 10.0000 mg | ORAL_TABLET | Freq: Every day | ORAL | 1 refills | Status: DC

## 2019-08-12 MED ORDER — LORAZEPAM 0.5 MG PO TABS: 0.5000 mg | ORAL_TABLET | Freq: Every evening | ORAL | 0 refills | Status: DC | PRN

## 2019-08-12 MED ORDER — PROCHLORPERAZINE MALEATE 10 MG PO TABS: 10.0000 mg | ORAL_TABLET | Freq: Four times a day (QID) | ORAL | 1 refills | Status: DC | PRN

## 2019-08-12 MED ORDER — DEXAMETHASONE 4 MG PO TABS: ORAL_TABLET | ORAL | 1 refills | Status: DC

## 2019-08-12 NOTE — Progress Notes (Signed)
Met with patient at registration to introduce myself as Arboriculturist and to offer available resources.  Discussed one-time [employer] grant to assist with personal expenses while going through treatment.  Gave her my card if interested in applying and for any additional financial questions or concerns.

## 2019-08-13 ENCOUNTER — Ambulatory Visit: Payer: Managed Care, Other (non HMO) | Admitting: Oncology

## 2019-08-13 ENCOUNTER — Other Ambulatory Visit (HOSPITAL_COMMUNITY)
Admission: RE | Admit: 2019-08-13 | Discharge: 2019-08-13 | Disposition: A | Discharge status: Home / Self Care | Payer: Managed Care, Other (non HMO) | Source: Ambulatory Visit | Attending: Surgery | Admitting: Surgery

## 2019-08-13 DIAGNOSIS — Z20822 Contact with and (suspected) exposure to covid-19: Secondary | ICD-10-CM | POA: Insufficient documentation

## 2019-08-13 DIAGNOSIS — Z01812 Encounter for preprocedural laboratory examination: Secondary | ICD-10-CM | POA: Insufficient documentation

## 2019-08-14 LAB — NOVEL CORONAVIRUS, NAA (HOSP ORDER, SEND-OUT TO REF LAB; TAT 18-24 HRS): SARS-CoV-2, NAA: NOT DETECTED

## 2019-08-15 ENCOUNTER — Other Ambulatory Visit: Payer: Self-pay

## 2019-08-15 ENCOUNTER — Ambulatory Visit
Admission: RE | Admit: 2019-08-15 | Discharge: 2019-08-15 | Disposition: A | Discharge status: Home / Self Care | Payer: Managed Care, Other (non HMO) | Source: Ambulatory Visit | Attending: Oncology | Admitting: Oncology

## 2019-08-15 DIAGNOSIS — C50411 Malignant neoplasm of upper-outer quadrant of right female breast: Secondary | ICD-10-CM

## 2019-08-15 MED ORDER — GADOBUTROL 1 MMOL/ML IV SOLN
6.0000 mL | Freq: Once | INTRAVENOUS | Status: AC | PRN
  Administered 2019-08-15: 6 mL via INTRAVENOUS

## 2019-08-16 ENCOUNTER — Other Ambulatory Visit: Payer: Self-pay

## 2019-08-16 ENCOUNTER — Encounter (HOSPITAL_COMMUNITY): Payer: Self-pay | Admitting: Surgery

## 2019-08-16 ENCOUNTER — Inpatient Hospital Stay: Payer: Managed Care, Other (non HMO)

## 2019-08-16 ENCOUNTER — Encounter: Payer: Self-pay | Admitting: *Deleted

## 2019-08-16 NOTE — Progress Notes (Signed)
Paige Gilbert denies chest pa in or shortness.  Patient tested negative for Covid 08/13/2018 and has been in quarantine with family since that time.

## 2019-08-17 ENCOUNTER — Ambulatory Visit (HOSPITAL_COMMUNITY): Payer: Managed Care, Other (non HMO)

## 2019-08-17 ENCOUNTER — Encounter (HOSPITAL_COMMUNITY): Payer: Self-pay | Admitting: Surgery

## 2019-08-17 ENCOUNTER — Other Ambulatory Visit (HOSPITAL_COMMUNITY): Payer: Managed Care, Other (non HMO)

## 2019-08-17 ENCOUNTER — Ambulatory Visit (HOSPITAL_COMMUNITY)
Admission: RE | Admit: 2019-08-17 | Discharge: 2019-08-17 | Disposition: A | Discharge status: Home / Self Care | Payer: Managed Care, Other (non HMO) | Attending: Surgery | Admitting: Surgery

## 2019-08-17 ENCOUNTER — Ambulatory Visit (HOSPITAL_COMMUNITY): Payer: Managed Care, Other (non HMO) | Admitting: Certified Registered Nurse Anesthetist

## 2019-08-17 ENCOUNTER — Encounter (HOSPITAL_COMMUNITY)
Admission: RE | Disposition: A | Discharge status: Home / Self Care | Payer: Self-pay | Source: Home / Self Care | Attending: Surgery

## 2019-08-17 ENCOUNTER — Other Ambulatory Visit: Payer: Self-pay

## 2019-08-17 ENCOUNTER — Encounter (HOSPITAL_COMMUNITY): Payer: Managed Care, Other (non HMO)

## 2019-08-17 DIAGNOSIS — I999 Unspecified disorder of circulatory system: Secondary | ICD-10-CM | POA: Diagnosis not present

## 2019-08-17 DIAGNOSIS — Z419 Encounter for procedure for purposes other than remedying health state, unspecified: Secondary | ICD-10-CM

## 2019-08-17 DIAGNOSIS — E7800 Pure hypercholesterolemia, unspecified: Secondary | ICD-10-CM | POA: Insufficient documentation

## 2019-08-17 DIAGNOSIS — C50411 Malignant neoplasm of upper-outer quadrant of right female breast: Secondary | ICD-10-CM | POA: Diagnosis present

## 2019-08-17 DIAGNOSIS — Z95828 Presence of other vascular implants and grafts: Secondary | ICD-10-CM

## 2019-08-17 HISTORY — DX: Asymptomatic varicose veins of left lower extremity: I83.92

## 2019-08-17 HISTORY — PX: PORTACATH PLACEMENT: SHX2246

## 2019-08-17 LAB — CBC WITH DIFFERENTIAL/PLATELET
Abs Immature Granulocytes: 0.01 10*3/uL (ref 0.00–0.07)
Basophils Absolute: 0.1 10*3/uL (ref 0.0–0.1)
Basophils Relative: 1 %
Eosinophils Absolute: 0.1 10*3/uL (ref 0.0–0.5)
Eosinophils Relative: 2 %
HCT: 40.6 % (ref 36.0–46.0)
Hemoglobin: 13 g/dL (ref 12.0–15.0)
Immature Granulocytes: 0 %
Lymphocytes Relative: 34 %
Lymphs Abs: 1.9 10*3/uL (ref 0.7–4.0)
MCH: 28.3 pg (ref 26.0–34.0)
MCHC: 32 g/dL (ref 30.0–36.0)
MCV: 88.5 fL (ref 80.0–100.0)
Monocytes Absolute: 0.3 10*3/uL (ref 0.1–1.0)
Monocytes Relative: 6 %
Neutro Abs: 3 10*3/uL (ref 1.7–7.7)
Neutrophils Relative %: 57 %
Platelets: 182 10*3/uL (ref 150–400)
RBC: 4.59 MIL/uL (ref 3.87–5.11)
RDW: 12.7 % (ref 11.5–15.5)
WBC: 5.4 10*3/uL (ref 4.0–10.5)
nRBC: 0 % (ref 0.0–0.2)

## 2019-08-17 LAB — COMPREHENSIVE METABOLIC PANEL
ALT: 23 U/L (ref 0–44)
AST: 23 U/L (ref 15–41)
Albumin: 3.7 g/dL (ref 3.5–5.0)
Alkaline Phosphatase: 56 U/L (ref 38–126)
Anion gap: 9 (ref 5–15)
BUN: 15 mg/dL (ref 8–23)
CO2: 25 mmol/L (ref 22–32)
Calcium: 8.7 mg/dL — ABNORMAL LOW (ref 8.9–10.3)
Chloride: 105 mmol/L (ref 98–111)
Creatinine, Ser: 0.68 mg/dL (ref 0.44–1.00)
GFR calc Af Amer: >60 mL/min (ref >60)
GFR calc non Af Amer: >60 mL/min (ref >60)
Glucose, Bld: 107 mg/dL — ABNORMAL HIGH (ref 70–99)
Potassium: 4 mmol/L (ref 3.5–5.1)
Sodium: 139 mmol/L (ref 135–145)
Total Bilirubin: 0.5 mg/dL (ref 0.3–1.2)
Total Protein: 6.3 g/dL — ABNORMAL LOW (ref 6.5–8.1)

## 2019-08-17 SURGERY — INSERTION, TUNNELED CENTRAL VENOUS DEVICE, WITH PORT
Anesthesia: General | Site: Neck | Laterality: Right

## 2019-08-17 MED ORDER — IBUPROFEN 800 MG PO TABS: 800.0000 mg | ORAL_TABLET | Freq: Three times a day (TID) | ORAL | 0 refills | Status: DC | PRN

## 2019-08-17 MED ORDER — ACETAMINOPHEN 500 MG PO TABS
1000.0000 mg | ORAL_TABLET | ORAL | Status: AC
  Administered 2019-08-17: 06:00:00 1000 mg via ORAL
  Filled 2019-08-17: qty 2

## 2019-08-17 MED ORDER — SODIUM CHLORIDE 0.9 % IV SOLN
INTRAVENOUS | Status: AC
  Filled 2019-08-17: qty 1.2

## 2019-08-17 MED ORDER — BUPIVACAINE-EPINEPHRINE 0.25% -1:200000 IJ SOLN
INTRAMUSCULAR | Status: DC | PRN
  Administered 2019-08-17: 10 mL

## 2019-08-17 MED ORDER — PROPOFOL 10 MG/ML IV BOLUS
INTRAVENOUS | Status: DC | PRN
  Administered 2019-08-17: 150 mg via INTRAVENOUS

## 2019-08-17 MED ORDER — OXYCODONE HCL 5 MG PO TABS: 5.0000 mg | ORAL_TABLET | Freq: Four times a day (QID) | ORAL | 0 refills | Status: DC | PRN

## 2019-08-17 MED ORDER — OXYCODONE HCL 5 MG PO TABS: 5.0000 mg | ORAL_TABLET | Freq: Once | ORAL | Status: DC | PRN

## 2019-08-17 MED ORDER — ONDANSETRON HCL 4 MG/2ML IJ SOLN
INTRAMUSCULAR | Status: DC | PRN
  Administered 2019-08-17: 4 mg via INTRAVENOUS

## 2019-08-17 MED ORDER — 0.9 % SODIUM CHLORIDE (POUR BTL) OPTIME
TOPICAL | Status: DC | PRN
  Administered 2019-08-17: 1000 mL

## 2019-08-17 MED ORDER — CEFAZOLIN SODIUM-DEXTROSE 2-4 GM/100ML-% IV SOLN
2.0000 g | INTRAVENOUS | Status: AC
  Administered 2019-08-17: 2 g via INTRAVENOUS
  Filled 2019-08-17: qty 100

## 2019-08-17 MED ORDER — MEPERIDINE HCL 25 MG/ML IJ SOLN: 6.2500 mg | INTRAMUSCULAR | Status: DC | PRN

## 2019-08-17 MED ORDER — PROPOFOL 10 MG/ML IV BOLUS
INTRAVENOUS | Status: AC
  Filled 2019-08-17: qty 20

## 2019-08-17 MED ORDER — ONDANSETRON HCL 4 MG/2ML IJ SOLN
INTRAMUSCULAR | Status: AC
  Filled 2019-08-17: qty 2

## 2019-08-17 MED ORDER — HEPARIN SOD (PORK) LOCK FLUSH 100 UNIT/ML IV SOLN
INTRAVENOUS | Status: DC | PRN
  Administered 2019-08-17: 480 [IU] via INTRAVENOUS

## 2019-08-17 MED ORDER — PROMETHAZINE HCL 25 MG/ML IJ SOLN
INTRAMUSCULAR | Status: AC
  Filled 2019-08-17: qty 1

## 2019-08-17 MED ORDER — SODIUM CHLORIDE 0.9 % IV SOLN
INTRAVENOUS | Status: DC | PRN
  Administered 2019-08-17: 500 mL

## 2019-08-17 MED ORDER — OXYCODONE HCL 5 MG/5ML PO SOLN: 5.0000 mg | Freq: Once | ORAL | Status: DC | PRN

## 2019-08-17 MED ORDER — CHLORHEXIDINE GLUCONATE CLOTH 2 % EX PADS: 6.0000 | MEDICATED_PAD | Freq: Once | CUTANEOUS | Status: DC

## 2019-08-17 MED ORDER — BUPIVACAINE HCL (PF) 0.25 % IJ SOLN
INTRAMUSCULAR | Status: AC
  Filled 2019-08-17: qty 30

## 2019-08-17 MED ORDER — MIDAZOLAM HCL 2 MG/2ML IJ SOLN
INTRAMUSCULAR | Status: AC
  Filled 2019-08-17: qty 2

## 2019-08-17 MED ORDER — PHENYLEPHRINE HCL (PRESSORS) 10 MG/ML IV SOLN
INTRAVENOUS | Status: DC | PRN
  Administered 2019-08-17 (×3): 80 ug via INTRAVENOUS

## 2019-08-17 MED ORDER — LIDOCAINE HCL (CARDIAC) PF 100 MG/5ML IV SOSY
PREFILLED_SYRINGE | INTRAVENOUS | Status: DC | PRN
  Administered 2019-08-17: 60 mg via INTRATRACHEAL

## 2019-08-17 MED ORDER — FENTANYL CITRATE (PF) 250 MCG/5ML IJ SOLN
INTRAMUSCULAR | Status: AC
  Filled 2019-08-17: qty 5

## 2019-08-17 MED ORDER — LIDOCAINE 2% (20 MG/ML) 5 ML SYRINGE
INTRAMUSCULAR | Status: AC
  Filled 2019-08-17: qty 5

## 2019-08-17 MED ORDER — DEXAMETHASONE SODIUM PHOSPHATE 10 MG/ML IJ SOLN
INTRAMUSCULAR | Status: AC
  Filled 2019-08-17: qty 1

## 2019-08-17 MED ORDER — MIDAZOLAM HCL 2 MG/2ML IJ SOLN
INTRAMUSCULAR | Status: DC | PRN
  Administered 2019-08-17: 2 mg via INTRAVENOUS

## 2019-08-17 MED ORDER — HYDROMORPHONE HCL 1 MG/ML IJ SOLN: 0.2500 mg | INTRAMUSCULAR | Status: DC | PRN

## 2019-08-17 MED ORDER — LACTATED RINGERS IV SOLN: INTRAVENOUS | Status: DC | PRN

## 2019-08-17 MED ORDER — DEXAMETHASONE SODIUM PHOSPHATE 10 MG/ML IJ SOLN
INTRAMUSCULAR | Status: DC | PRN
  Administered 2019-08-17: 5 mg via INTRAVENOUS

## 2019-08-17 MED ORDER — PROMETHAZINE HCL 25 MG/ML IJ SOLN
6.2500 mg | INTRAMUSCULAR | Status: DC | PRN
  Administered 2019-08-17: 09:00:00 6.25 mg via INTRAVENOUS

## 2019-08-17 MED ORDER — HEPARIN SOD (PORK) LOCK FLUSH 100 UNIT/ML IV SOLN
INTRAVENOUS | Status: AC
  Filled 2019-08-17: qty 5

## 2019-08-17 SURGICAL SUPPLY — 43 items
ADH SKN CLS APL DERMABOND .7 (GAUZE/BANDAGES/DRESSINGS) ×1
APL PRP STRL LF DISP 70% ISPRP (MISCELLANEOUS) ×1
BAG DECANTER FOR FLEXI CONT (MISCELLANEOUS) ×3 IMPLANT
CHLORAPREP W/TINT 26 (MISCELLANEOUS) ×3 IMPLANT
COVER SURGICAL LIGHT HANDLE (MISCELLANEOUS) ×3 IMPLANT
COVER TRANSDUCER ULTRASND GEL (DRAPE) ×3 IMPLANT
COVER WAND RF STERILE (DRAPES) ×1 IMPLANT
DERMABOND ADVANCED (GAUZE/BANDAGES/DRESSINGS) ×2
DERMABOND ADVANCED .7 DNX12 (GAUZE/BANDAGES/DRESSINGS) ×1 IMPLANT
DRAPE C-ARM 42X120 X-RAY (DRAPES) ×3 IMPLANT
DRSG TEGADERM 4X4.5 CHG (GAUZE/BANDAGES/DRESSINGS) ×2 IMPLANT
ELECT CAUTERY BLADE 6.4 (BLADE) ×3 IMPLANT
ELECT REM PT RETURN 9FT ADLT (ELECTROSURGICAL) ×3
ELECTRODE REM PT RTRN 9FT ADLT (ELECTROSURGICAL) ×1 IMPLANT
GAUZE 4X4 16PLY RFD (DISPOSABLE) ×3 IMPLANT
GAUZE SPONGE 4X4 12PLY STRL LF (GAUZE/BANDAGES/DRESSINGS) ×2 IMPLANT
GEL ULTRASOUND 20GR AQUASONIC (MISCELLANEOUS) ×2 IMPLANT
GLOVE BIO SURGEON STRL SZ8 (GLOVE) ×5 IMPLANT
GLOVE BIOGEL PI IND STRL 8 (GLOVE) ×1 IMPLANT
GLOVE BIOGEL PI INDICATOR 8 (GLOVE) ×4
GOWN STRL REUS W/ TWL LRG LVL3 (GOWN DISPOSABLE) ×1 IMPLANT
GOWN STRL REUS W/ TWL XL LVL3 (GOWN DISPOSABLE) ×1 IMPLANT
GOWN STRL REUS W/TWL LRG LVL3 (GOWN DISPOSABLE) ×15
GOWN STRL REUS W/TWL XL LVL3 (GOWN DISPOSABLE) ×3
INTRODUCER COOK 11FR (CATHETERS) IMPLANT
KIT BASIN OR (CUSTOM PROCEDURE TRAY) ×3 IMPLANT
KIT PORT POWER 8FR ISP CVUE (Port) ×2 IMPLANT
KIT TURNOVER KIT B (KITS) ×3 IMPLANT
NS IRRIG 1000ML POUR BTL (IV SOLUTION) ×3 IMPLANT
PAD ARMBOARD 7.5X6 YLW CONV (MISCELLANEOUS) ×3 IMPLANT
PENCIL BUTTON HOLSTER BLD 10FT (ELECTRODE) ×3 IMPLANT
POSITIONER HEAD DONUT 9IN (MISCELLANEOUS) ×3 IMPLANT
SET INTRODUCER 12FR PACEMAKER (INTRODUCER) IMPLANT
SET SHEATH INTRODUCER 10FR (MISCELLANEOUS) IMPLANT
SHEATH COOK PEEL AWAY SET 9F (SHEATH) IMPLANT
SUT MNCRL AB 4-0 PS2 18 (SUTURE) ×3 IMPLANT
SUT PROLENE 2 0 SH 30 (SUTURE) ×3 IMPLANT
SUT VIC AB 3-0 SH 27 (SUTURE) ×3
SUT VIC AB 3-0 SH 27X BRD (SUTURE) ×1 IMPLANT
SYR 5ML LUER SLIP (SYRINGE) ×3 IMPLANT
TOWEL GREEN STERILE (TOWEL DISPOSABLE) ×3 IMPLANT
TOWEL GREEN STERILE FF (TOWEL DISPOSABLE) ×3 IMPLANT
TRAY LAPAROSCOPIC MC (CUSTOM PROCEDURE TRAY) ×3 IMPLANT

## 2019-08-17 NOTE — Transfer of Care (Signed)
Immediate Anesthesia Transfer of Care Note  Patient: Paige Gilbert  Procedure(s) Performed: INSERTION PORT-A-CATH WITH ULTRASOUND (Right Neck)  Patient Location: PACU  Anesthesia Type:General  Level of Consciousness: drowsy and patient cooperative  Airway & Oxygen Therapy: Patient Spontanous Breathing and Patient connected to face mask oxygen  Post-op Assessment: Report given to RN and Post -op Vital signs reviewed and stable  Post vital signs: Reviewed and stable  Last Vitals:  Vitals Value Taken Time  BP    Temp    Pulse 86 08/17/19 0900  Resp 18 08/17/19 0900  SpO2 99 % 08/17/19 0900  Vitals shown include unvalidated device data.  Last Pain:  Vitals:   08/17/19 0622  TempSrc: Oral  PainSc:          Complications: No apparent anesthesia complications

## 2019-08-17 NOTE — Discharge Instructions (Signed)
General Anesthesia, Adult, Care After °This sheet gives you information about how to care for yourself after your procedure. Your health care provider may also give you more specific instructions. If you have problems or questions, contact your health care provider. °What can I expect after the procedure? °After the procedure, the following side effects are common: °· Pain or discomfort at the IV site. °· Nausea. °· Vomiting. °· Sore throat. °· Trouble concentrating. °· Feeling cold or chills. °· Weak or tired. °· Sleepiness and fatigue. °· Soreness and body aches. These side effects can affect parts of the body that were not involved in surgery. °Follow these instructions at home: ° °For at least 24 hours after the procedure: °· Have a responsible adult stay with you. It is important to have someone help care for you until you are awake and alert. °· Rest as needed. °· Do not: °? Participate in activities in which you could fall or become injured. °? Drive. °? Use heavy machinery. °? Drink alcohol. °? Take sleeping pills or medicines that cause drowsiness. °? Make important decisions or sign legal documents. °? Take care of children on your own. °Eating and drinking °· Follow any instructions from your health care provider about eating or drinking restrictions. °· When you feel hungry, start by eating small amounts of foods that are soft and easy to digest (bland), such as toast. Gradually return to your regular diet. °· Drink enough fluid to keep your urine pale yellow. °· If you vomit, rehydrate by drinking water, juice, or clear broth. °General instructions °· If you have sleep apnea, surgery and certain medicines can increase your risk for breathing problems. Follow instructions from your health care provider about wearing your sleep device: °? Anytime you are sleeping, including during daytime naps. °? While taking prescription pain medicines, sleeping medicines, or medicines that make you drowsy. °· Return to  your normal activities as told by your health care provider. Ask your health care provider what activities are safe for you. °· Take over-the-counter and prescription medicines only as told by your health care provider. °· If you smoke, do not smoke without supervision. °· Keep all follow-up visits as told by your health care provider. This is important. °Contact a health care provider if: °· You have nausea or vomiting that does not get better with medicine. °· You cannot eat or drink without vomiting. °· You have pain that does not get better with medicine. °· You are unable to pass urine. °· You develop a skin rash. °· You have a fever. °· You have redness around your IV site that gets worse. °Get help right away if: °· You have difficulty breathing. °· You have chest pain. °· You have blood in your urine or stool, or you vomit blood. °Summary °· After the procedure, it is common to have a sore throat or nausea. It is also common to feel tired. °· Have a responsible adult stay with you for the first 24 hours after general anesthesia. It is important to have someone help care for you until you are awake and alert. °· When you feel hungry, start by eating small amounts of foods that are soft and easy to digest (bland), such as toast. Gradually return to your regular diet. °· Drink enough fluid to keep your urine pale yellow. °· Return to your normal activities as told by your health care provider. Ask your health care provider what activities are safe for you. °This information is not   intended to replace advice given to you by your health care provider. Make sure you discuss any questions you have with your health care provider. Document Revised: 07/18/2017 Document Reviewed: 02/28/2017 Elsevier Patient Education  [place]: POST OP INSTRUCTIONS  Always review your discharge instruction sheet given to you by the facility where your surgery was performed.   1. A prescription for  pain medication may be given to you upon discharge. Take your pain medication as prescribed, if needed. If narcotic pain medicine is not needed, then you make take acetaminophen (Tylenol) or ibuprofen (Advil) as needed.  2. Take your usually prescribed medications unless otherwise directed. 3. If you need a refill on your pain medication, please contact our office. All narcotic pain medicine now requires a paper prescription.  Phoned in and fax refills are no longer allowed by law.  Prescriptions will not be filled after 5 pm or on weekends.  4. You should follow a light diet for the remainder of the day after your procedure. 5. Most patients will experience some mild swelling and/or bruising in the area of the incision. It may take several days to resolve. 6. It is common to experience some constipation if taking pain medication after surgery. Increasing fluid intake and taking a stool softener (such as Colace) will usually help or prevent this problem from occurring. A mild laxative (Milk of Magnesia or Miralax) should be taken according to package directions if there are no bowel movements after 48 hours.  7. Unless discharge instructions indicate otherwise, you may remove your bandages 48 hours after surgery, and you may shower at that time. You may have steri-strips (small white skin tapes) in place directly over the incision.  These strips should be left on the skin for 7-10 days.  If your surgeon used Dermabond (skin glue) on the incision, you may shower in 24 hours.  The glue will flake off over the next 2-3 weeks.  8. If your port is left accessed at the end of surgery (needle left in port), the dressing cannot get wet and should only by changed by a healthcare professional. When the port is no longer accessed (when the needle has been removed), follow step 7.   9. ACTIVITIES:  Limit activity involving your arms for the next 72 hours. Do no strenuous exercise or activity for 1 week. You may drive  when you are no longer taking prescription pain medication, you can comfortably wear a seatbelt, and you can maneuver your car. 10.You may need to see your doctor in the office for a follow-up appointment.  Please       check with your doctor.  11.When you receive a new Port-a-Cath, you will get a product guide and        ID card.  Please keep them in case you need them.  WHEN TO CALL YOUR DOCTOR [phone]): 1. Fever over 101.0 2. Chills 3. Continued bleeding from incision 4. Increased redness and tenderness at the site 5. Shortness of breath, difficulty breathing   The clinic staff is available to answer your questions during regular business hours. Please dont hesitate to call and ask to speak to one of the nurses or medical assistants for clinical concerns. If you have a medical emergency, go to the nearest emergency room or call 911.  A surgeon from [facility] is always on call at the hospital.     For further information, please visit [email or web address]

## 2019-08-17 NOTE — Interval H&P Note (Signed)
History and Physical Interval Note:  08/17/2019 7:20 AM  Paige Gilbert  has presented today for surgery, with the diagnosis of BREAST CANCER.  The various methods of treatment have been discussed with the patient and family. After consideration of risks, benefits and other options for treatment, the patient has consented to  Procedure(s): INSERTION PORT-A-CATH WITH ULTRASOUND (N/A) as a surgical intervention.  The patient's history has been reviewed, patient examined, no change in status, stable for surgery.  I have reviewed the patient's chart and labs.  Questions were answered to the patient's satisfaction.     [place]

## 2019-08-17 NOTE — OR Nursing (Signed)
Pt ring removed from left ring finger. Placed in bag with patient label and placed in chart.   Pt informed prior to procedure.

## 2019-08-17 NOTE — Anesthesia Preprocedure Evaluation (Signed)
Anesthesia Evaluation  Patient identified by MRN, date of birth, ID band Patient awake    Reviewed: Allergy & Precautions, NPO status , Patient's Chart, lab work & pertinent test results  Airway Mallampati: II  TM Distance: >3 FB Neck ROM: Full    Dental no notable dental hx.    Pulmonary neg pulmonary ROS,    Pulmonary exam normal breath sounds clear to auscultation       Cardiovascular negative cardio ROS Normal cardiovascular exam Rhythm:Regular Rate:Normal     Neuro/Psych negative neurological ROS  negative psych ROS   GI/Hepatic negative GI ROS, Neg liver ROS,   Endo/Other  negative endocrine ROS  Renal/GU negative Renal ROS  negative genitourinary   Musculoskeletal negative musculoskeletal ROS (+)   Abdominal   Peds negative pediatric ROS (+)  Hematology negative hematology ROS (+)   Anesthesia Other Findings Breast Cancer  Reproductive/Obstetrics negative OB ROS                             Anesthesia Physical Anesthesia Plan  ASA: III  Anesthesia Plan: General   Post-op Pain Management:    Induction: Intravenous  PONV Risk Score and Plan: 3 and Ondansetron, Dexamethasone, Midazolam and Treatment may vary due to age or medical condition  Airway Management Planned: LMA  Additional Equipment:   Intra-op Plan:   Post-operative Plan: Extubation in OR  Informed Consent: I have reviewed the patients History and Physical, chart, labs and discussed the procedure including the risks, benefits and alternatives for the proposed anesthesia with the patient or authorized representative who has indicated his/her understanding and acceptance.     Dental advisory given  Plan Discussed with: CRNA  Anesthesia Plan Comments:         Anesthesia Quick Evaluation

## 2019-08-17 NOTE — Op Note (Signed)
Preoperative diagnosis: PAC needed for chemotherapy   Postoperative diagnosis: Same  Procedure: Portacath Placement with U/S and C arm guidance    Surgeon: Turner Daniels, MD, FACS  Anesthesia: General and 0.25 % marcaine with epinephrine  Clinical History and Indications: The patient is getting ready to begin chemotherapy for her cancer. She  needs a Port-A-Cath for venous access. Risk of bleeding, infection,  Collapse lung,  Death,  DVT,  Organ injury,  Mediastinal injury,  Injury to heart,  Injury to blood vessels,  Nerves,  Migration of catheter,  Embolization of catheter and the need for more surgery.  Description of Procedure: I have seen the patient in the holding area and confirmed the plans for the procedure as noted above. I reviewed the risks and complications again and the patient has no further questions. She wishes to proceed.   The patient was then taken to the operating room. After satisfactory general  anesthesia had been obtained the upper chest and lower neck were prepped and draped as a sterile field. The timeout was done.  The right internal jugular vein  was entered under U/S guidance  and the guidewire threaded into the superior vena cava right atrial area under fluoroscopic guidance. An incision was then made on the anterior chest wall and a subcutaneous pocket fashioned for the port reservoir.  The port tubing was then brought through a subcutaneous tunnel from the port site to the guidewire site.  The port and catheter were attached, locked  and flushed. The catheter was measured and cut to appropriate length.The dilator and peel-away sheath were then advanced over the guidewire while monitoring this with fluoroscopy. The guidewire and dilator were removed and the tubing threaded to approximately 22 cm. The peel-away sheath was then removed. The catheter aspirated and flushed easily. Using fluoroscopy the tip was in the superior vena cava right atrial junction area. It  aspirated and flushed easily. That aspirated and flushed easily.  The reservoir was secured to the fascia with 1 sutures of 2-0 Prolene. A final check with fluoroscopy was done to make sure we had no kinks and good positioning of the tip of the catheter. Everything appeared to be okay. The catheter was aspirated, flushed with dilute heparin and then concentrated aqueous heparin.  The incision was then closed with interrupted 3-0 Vicryl, and 4-0 Monocryl subcuticular with Dermabond on the skin.  There were no operative complications. Estimated blood loss was minimal. All counts were correct. The patient tolerated the procedure well.  Turner Daniels, MD, FACS

## 2019-08-17 NOTE — Progress Notes (Signed)
Care of pt assumed by MA Kobee Medlen RN 

## 2019-08-17 NOTE — Anesthesia Procedure Notes (Signed)
Procedure Name: LMA Insertion Date/Time: 08/17/2019 7:51 AM Performed by: Kathryne Hitch, CRNA Pre-anesthesia Checklist: Patient identified, Emergency Drugs available, Patient being monitored and Suction available Patient Re-evaluated:Patient Re-evaluated prior to induction Oxygen Delivery Method: Circle system utilized Preoxygenation: Pre-oxygenation with 100% oxygen Induction Type: IV induction Ventilation: Mask ventilation without difficulty LMA: LMA inserted LMA Size: 4.0 Tube secured with: Tape Dental Injury: Teeth and Oropharynx as per pre-operative assessment

## 2019-08-18 ENCOUNTER — Encounter: Payer: Self-pay | Admitting: *Deleted

## 2019-08-18 ENCOUNTER — Inpatient Hospital Stay: Payer: Managed Care, Other (non HMO)

## 2019-08-18 ENCOUNTER — Other Ambulatory Visit: Payer: Self-pay

## 2019-08-18 ENCOUNTER — Telehealth: Payer: Self-pay | Admitting: *Deleted

## 2019-08-18 VITALS — BP 104/68 | HR 68 | Temp 98.2°F | Resp 12 | Wt 142.6 lb

## 2019-08-18 DIAGNOSIS — Z170 Estrogen receptor positive status [ER+]: Secondary | ICD-10-CM

## 2019-08-18 DIAGNOSIS — Z5111 Encounter for antineoplastic chemotherapy: Secondary | ICD-10-CM | POA: Diagnosis not present

## 2019-08-18 DIAGNOSIS — C50411 Malignant neoplasm of upper-outer quadrant of right female breast: Secondary | ICD-10-CM

## 2019-08-18 DIAGNOSIS — Z95828 Presence of other vascular implants and grafts: Secondary | ICD-10-CM

## 2019-08-18 LAB — COMPREHENSIVE METABOLIC PANEL
ALT: 18 U/L (ref 0–44)
AST: 14 U/L — ABNORMAL LOW (ref 15–41)
Albumin: 3.8 g/dL (ref 3.5–5.0)
Alkaline Phosphatase: 65 U/L (ref 38–126)
Anion gap: 7 (ref 5–15)
BUN: 13 mg/dL (ref 8–23)
CO2: 25 mmol/L (ref 22–32)
Calcium: 8.6 mg/dL — ABNORMAL LOW (ref 8.9–10.3)
Chloride: 109 mmol/L (ref 98–111)
Creatinine, Ser: 0.73 mg/dL (ref 0.44–1.00)
GFR calc Af Amer: >60 mL/min (ref >60)
GFR calc non Af Amer: >60 mL/min (ref >60)
Glucose, Bld: 109 mg/dL — ABNORMAL HIGH (ref 70–99)
Potassium: 3.6 mmol/L (ref 3.5–5.1)
Sodium: 141 mmol/L (ref 135–145)
Total Bilirubin: 0.3 mg/dL (ref 0.3–1.2)
Total Protein: 6.4 g/dL — ABNORMAL LOW (ref 6.5–8.1)

## 2019-08-18 LAB — CBC WITH DIFFERENTIAL/PLATELET
Abs Immature Granulocytes: 0.02 10*3/uL (ref 0.00–0.07)
Basophils Absolute: 0 10*3/uL (ref 0.0–0.1)
Basophils Relative: 0 %
Eosinophils Absolute: 0.1 10*3/uL (ref 0.0–0.5)
Eosinophils Relative: 1 %
HCT: 36.5 % (ref 36.0–46.0)
Hemoglobin: 12.3 g/dL (ref 12.0–15.0)
Immature Granulocytes: 0 %
Lymphocytes Relative: 35 %
Lymphs Abs: 2.7 10*3/uL (ref 0.7–4.0)
MCH: 28.7 pg (ref 26.0–34.0)
MCHC: 33.7 g/dL (ref 30.0–36.0)
MCV: 85.1 fL (ref 80.0–100.0)
Monocytes Absolute: 0.4 10*3/uL (ref 0.1–1.0)
Monocytes Relative: 6 %
Neutro Abs: 4.5 10*3/uL (ref 1.7–7.7)
Neutrophils Relative %: 58 %
Platelets: 161 10*3/uL (ref 150–400)
RBC: 4.29 MIL/uL (ref 3.87–5.11)
RDW: 12.8 % (ref 11.5–15.5)
WBC: 7.7 10*3/uL (ref 4.0–10.5)
nRBC: 0 % (ref 0.0–0.2)

## 2019-08-18 MED ORDER — DOXORUBICIN HCL CHEMO IV INJECTION 2 MG/ML
60.0000 mg/m2 | Freq: Once | INTRAVENOUS | Status: AC
  Administered 2019-08-18: 11:00:00 102 mg via INTRAVENOUS
  Filled 2019-08-18: qty 51

## 2019-08-18 MED ORDER — PALONOSETRON HCL INJECTION 0.25 MG/5ML
INTRAVENOUS | Status: AC
  Filled 2019-08-18: qty 5

## 2019-08-18 MED ORDER — SODIUM CHLORIDE 0.9 % IV SOLN
Freq: Once | INTRAVENOUS | Status: AC
  Filled 2019-08-18: qty 250

## 2019-08-18 MED ORDER — SODIUM CHLORIDE 0.9 % IV SOLN
150.0000 mg | Freq: Once | INTRAVENOUS | Status: AC
  Administered 2019-08-18: 10:00:00 150 mg via INTRAVENOUS
  Filled 2019-08-18: qty 5

## 2019-08-18 MED ORDER — HEPARIN SOD (PORK) LOCK FLUSH 100 UNIT/ML IV SOLN
500.0000 [IU] | Freq: Once | INTRAVENOUS | Status: AC | PRN
  Administered 2019-08-18: 12:00:00 500 [IU]
  Filled 2019-08-18: qty 5

## 2019-08-18 MED ORDER — DEXAMETHASONE SODIUM PHOSPHATE 10 MG/ML IJ SOLN
INTRAMUSCULAR | Status: AC
  Filled 2019-08-18: qty 1

## 2019-08-18 MED ORDER — PALONOSETRON HCL INJECTION 0.25 MG/5ML
0.2500 mg | Freq: Once | INTRAVENOUS | Status: AC
  Administered 2019-08-18: 10:00:00 0.25 mg via INTRAVENOUS

## 2019-08-18 MED ORDER — SODIUM CHLORIDE 0.9 % IV SOLN
600.0000 mg/m2 | Freq: Once | INTRAVENOUS | Status: AC
  Administered 2019-08-18: 12:00:00 1020 mg via INTRAVENOUS
  Filled 2019-08-18: qty 51

## 2019-08-18 MED ORDER — SODIUM CHLORIDE 0.9% FLUSH
10.0000 mL | INTRAVENOUS | Status: DC | PRN
  Administered 2019-08-18: 12:00:00 10 mL
  Filled 2019-08-18: qty 10

## 2019-08-18 MED ORDER — DEXAMETHASONE SODIUM PHOSPHATE 10 MG/ML IJ SOLN
10.0000 mg | Freq: Once | INTRAMUSCULAR | Status: AC
  Administered 2019-08-18: 10 mg via INTRAVENOUS

## 2019-08-18 MED ORDER — SODIUM CHLORIDE 0.9% FLUSH
10.0000 mL | INTRAVENOUS | Status: DC | PRN
  Administered 2019-08-18: 10 mL via INTRAVENOUS
  Filled 2019-08-18: qty 10

## 2019-08-18 NOTE — Patient Instructions (Signed)
[facility] Discharge Instructions for Patients Receiving Chemotherapy  Today you received the following chemotherapy agents: Doxorubicin, Cyclophosphamide  To help prevent nausea and vomiting after your treatment, we encourage you to take your nausea medication as directed by your MD.   If you develop nausea and vomiting that is not controlled by your nausea medication, call the clinic.   BELOW ARE SYMPTOMS THAT SHOULD BE REPORTED IMMEDIATELY:  *FEVER GREATER THAN 100.5 F  *CHILLS WITH OR WITHOUT FEVER  NAUSEA AND VOMITING THAT IS NOT CONTROLLED WITH YOUR NAUSEA MEDICATION  *UNUSUAL SHORTNESS OF BREATH  *UNUSUAL BRUISING OR BLEEDING  TENDERNESS IN MOUTH AND THROAT WITH OR WITHOUT PRESENCE OF ULCERS  *URINARY PROBLEMS  *BOWEL PROBLEMS  UNUSUAL RASH Items with * indicate a potential emergency and should be followed up as soon as possible.  Feel free to call the clinic should you have any questions or concerns. The clinic phone number is [phone].  Please show the [facility] at check-in to the Emergency Department and triage nurse.  Doxorubicin injection What is this medicine? DOXORUBICIN (dox oh ROO bi sin) is a chemotherapy drug. It is used to treat many kinds of cancer like leukemia, lymphoma, neuroblastoma, sarcoma, and Wilms' tumor. It is also used to treat bladder cancer, breast cancer, lung cancer, ovarian cancer, stomach cancer, and thyroid cancer. This medicine may be used for other purposes; ask your health care provider or pharmacist if you have questions. COMMON BRAND NAME(S): Adriamycin, Adriamycin PFS, Adriamycin RDF, Rubex What should I tell my health care provider before I take this medicine? They need to know if you have any of these conditions:  heart disease  history of low blood counts caused by a medicine  liver disease  recent or ongoing radiation therapy  an unusual or allergic reaction to doxorubicin, other chemotherapy  agents, other medicines, foods, dyes, or preservatives  pregnant or trying to get pregnant  breast-feeding How should I use this medicine? This drug is given as an infusion into a vein. It is administered in a hospital or clinic by a specially trained health care professional. If you have pain, swelling, burning or any unusual feeling around the site of your injection, tell your health care professional right away. Talk to your pediatrician regarding the use of this medicine in children. Special care may be needed. Overdosage: If you think you have taken too much of this medicine contact a poison control center or emergency room at once. NOTE: This medicine is only for you. Do not share this medicine with others. What if I miss a dose? It is important not to miss your dose. Call your doctor or health care professional if you are unable to keep an appointment. What may interact with this medicine? This medicine may interact with the following medications:  6-mercaptopurine  paclitaxel  phenytoin  St. John's Wort  trastuzumab  verapamil This list may not describe all possible interactions. Give your health care provider a list of all the medicines, herbs, non-prescription drugs, or dietary supplements you use. Also tell them if you smoke, drink alcohol, or use illegal drugs. Some items may interact with your medicine. What should I watch for while using this medicine? This drug may make you feel generally unwell. This is not uncommon, as chemotherapy can affect healthy cells as well as cancer cells. Report any side effects. Continue your course of treatment even though you feel ill unless your doctor tells you to stop. There is a  maximum amount of this medicine you should receive throughout your life. The amount depends on the medical condition being treated and your overall health. Your doctor will watch how much of this medicine you receive in your lifetime. Tell your doctor if you have  taken this medicine before. You may need blood work done while you are taking this medicine. Your urine may turn red for a few days after your dose. This is not blood. If your urine is dark or brown, call your doctor. In some cases, you may be given additional medicines to help with side effects. Follow all directions for their use. Call your doctor or health care professional for advice if you get a fever, chills or sore throat, or other symptoms of a cold or flu. Do not treat yourself. This drug decreases your body's ability to fight infections. Try to avoid being around people who are sick. This medicine may increase your risk to bruise or bleed. Call your doctor or health care professional if you notice any unusual bleeding. Talk to your doctor about your risk of cancer. You may be more at risk for certain types of cancers if you take this medicine. Do not become pregnant while taking this medicine or for 6 months after stopping it. Women should inform their doctor if they wish to become pregnant or think they might be pregnant. Men should not father a child while taking this medicine and for 6 months after stopping it. There is a potential for serious side effects to an unborn child. Talk to your health care professional or pharmacist for more information. Do not breast-feed an infant while taking this medicine. This medicine has caused ovarian failure in some women and reduced sperm counts in some men This medicine may interfere with the ability to have a child. Talk with your doctor or health care professional if you are concerned about your fertility. This medicine may cause a decrease in Co-Enzyme Q-10. You should make sure that you get enough Co-Enzyme Q-10 while you are taking this medicine. Discuss the foods you eat and the vitamins you take with your health care professional. What side effects may I notice from receiving this medicine? Side effects that you should report to your doctor or  health care professional as soon as possible:  allergic reactions like skin rash, itching or hives, swelling of the face, lips, or tongue  breathing problems  chest pain  fast or irregular heartbeat  low blood counts - this medicine may decrease the number of white blood cells, red blood cells and platelets. You may be at increased risk for infections and bleeding.  pain, redness, or irritation at site where injected  signs of infection - fever or chills, cough, sore throat, pain or difficulty passing urine  signs of decreased platelets or bleeding - bruising, pinpoint red spots on the skin, black, tarry stools, blood in the urine  swelling of the ankles, feet, hands  tiredness  weakness Side effects that usually do not require medical attention (report to your doctor or health care professional if they continue or are bothersome):  diarrhea  hair loss  mouth sores  nail discoloration or damage  nausea  red colored urine  vomiting This list may not describe all possible side effects. Call your doctor for medical advice about side effects. You may report side effects to FDA at 1-800-FDA-1088. Where should I keep my medicine? This drug is given in a hospital or clinic and will not be stored  at home. NOTE: This sheet is a summary. It may not cover all possible information. If you have questions about this medicine, talk to your doctor, pharmacist, or health care provider.  2020 Elsevier/Gold Standard (2017-02-26 11:01:26)  Cyclophosphamide Injection What is this medicine? CYCLOPHOSPHAMIDE (sye kloe FOSS fa mide) is a chemotherapy drug. It slows the growth of cancer cells. This medicine is used to treat many types of cancer like lymphoma, myeloma, leukemia, breast cancer, and ovarian cancer, to name a few. This medicine may be used for other purposes; ask your health care provider or pharmacist if you have questions. COMMON BRAND NAME(S): Cytoxan, Neosar What should I  tell my health care provider before I take this medicine? They need to know if you have any of these conditions:  heart disease  history of irregular heartbeat  infection  kidney disease  liver disease  low blood counts, like white cells, platelets, or red blood cells  on hemodialysis  recent or ongoing radiation therapy  scarring or thickening of the lungs  trouble passing urine  an unusual or allergic reaction to cyclophosphamide, other medicines, foods, dyes, or preservatives  pregnant or trying to get pregnant  breast-feeding How should I use this medicine? This drug is usually given as an injection into a vein or muscle or by infusion into a vein. It is administered in a hospital or clinic by a specially trained health care professional. Talk to your pediatrician regarding the use of this medicine in children. Special care may be needed. Overdosage: If you think you have taken too much of this medicine contact a poison control center or emergency room at once. NOTE: This medicine is only for you. Do not share this medicine with others. What if I miss a dose? It is important not to miss your dose. Call your doctor or health care professional if you are unable to keep an appointment. What may interact with this medicine?  amphotericin B  azathioprine  certain antivirals for HIV or hepatitis  certain medicines for blood pressure, heart disease, irregular heart beat  certain medicines that treat or prevent blood clots like warfarin  certain other medicines for cancer  cyclosporine  etanercept  indomethacin  medicines that relax muscles for surgery  medicines to increase blood counts  metronidazole This list may not describe all possible interactions. Give your health care provider a list of all the medicines, herbs, non-prescription drugs, or dietary supplements you use. Also tell them if you smoke, drink alcohol, or use illegal drugs. Some items may  interact with your medicine. What should I watch for while using this medicine? Your condition will be monitored carefully while you are receiving this medicine. You may need blood work done while you are taking this medicine. Drink water or other fluids as directed. Urinate often, even at night. Some products may contain alcohol. Ask your health care professional if this medicine contains alcohol. Be sure to tell all health care professionals you are taking this medicine. Certain medicines, like metronidazole and disulfiram, can cause an unpleasant reaction when taken with alcohol. The reaction includes flushing, headache, nausea, vomiting, sweating, and increased thirst. The reaction can last from 30 minutes to several hours. Do not become pregnant while taking this medicine or for 1 year after stopping it. Women should inform their health care professional if they wish to become pregnant or think they might be pregnant. Men should not father a child while taking this medicine and for 4 months after stopping it.  There is potential for serious side effects to an unborn child. Talk to your health care professional for more information. Do not breast-feed an infant while taking this medicine or for 1 week after stopping it. This medicine has caused ovarian failure in some women. This medicine may make it more difficult to get pregnant. Talk to your health care professional if you are concerned about your fertility. This medicine has caused decreased sperm counts in some men. This may make it more difficult to father a child. Talk to your health care professional if you are concerned about your fertility. Call your health care professional for advice if you get a fever, chills, or sore throat, or other symptoms of a cold or flu. Do not treat yourself. This medicine decreases your body's ability to fight infections. Try to avoid being around people who are sick. Avoid taking medicines that contain aspirin,  acetaminophen, ibuprofen, naproxen, or ketoprofen unless instructed by your health care professional. These medicines may hide a fever. Talk to your health care professional about your risk of cancer. You may be more at risk for certain types of cancer if you take this medicine. If you are going to need surgery or other procedure, tell your health care professional that you are using this medicine. Be careful brushing or flossing your teeth or using a toothpick because you may get an infection or bleed more easily. If you have any dental work done, tell your dentist you are receiving this medicine. What side effects may I notice from receiving this medicine? Side effects that you should report to your doctor or health care professional as soon as possible:  allergic reactions like skin rash, itching or hives, swelling of the face, lips, or tongue  breathing problems  nausea, vomiting  signs and symptoms of bleeding such as bloody or black, tarry stools; red or dark brown urine; spitting up blood or brown material that looks like coffee grounds; red spots on the skin; unusual bruising or bleeding from the eyes, gums, or nose  signs and symptoms of heart failure like fast, irregular heartbeat, sudden weight gain; swelling of the ankles, feet, hands  signs and symptoms of infection like fever; chills; cough; sore throat; pain or trouble passing urine  signs and symptoms of kidney injury like trouble passing urine or change in the amount of urine  signs and symptoms of liver injury like dark yellow or brown urine; general ill feeling or flu-like symptoms; light-colored stools; loss of appetite; nausea; right upper belly pain; unusually weak or tired; yellowing of the eyes or skin Side effects that usually do not require medical attention (report to your doctor or health care professional if they continue or are bothersome):  confusion  decreased hearing  diarrhea  facial flushing  hair  loss  headache  loss of appetite  missed menstrual periods  signs and symptoms of low red blood cells or anemia such as unusually weak or tired; feeling faint or lightheaded; falls  skin discoloration This list may not describe all possible side effects. Call your doctor for medical advice about side effects. You may report side effects to FDA at 1-800-FDA-1088. Where should I keep my medicine? This drug is given in a hospital or clinic and will not be stored at home. NOTE: This sheet is a summary. It may not cover all possible information. If you have questions about this medicine, talk to your doctor, pharmacist, or health care provider.  2020 Elsevier/Gold Standard (2019-04-19 09:53:29)  Coronavirus (COVID-19) Are you at risk?  Are you at risk for the Coronavirus (COVID-19)?  To be considered HIGH RISK for Coronavirus (COVID-19), you have to meet the following criteria:  . Traveled to [place], [place], [place], [place] or [place]; or in the [place] to [place], [place], [place], or [place]; and have fever, cough, and shortness of breath within the last 2 weeks of travel OR . Been in close contact with a person diagnosed with COVID-19 within the last 2 weeks and have fever, cough, and shortness of breath . IF YOU DO NOT MEET THESE CRITERIA, YOU ARE CONSIDERED LOW RISK FOR COVID-19.  What to do if you are HIGH RISK for COVID-19?  Marland Kitchen If you are having a medical emergency, call 911. . Seek medical care right away. Before you go to a doctor's office, urgent care or emergency department, call ahead and tell them about your recent travel, contact with someone diagnosed with COVID-19, and your symptoms. You should receive instructions from your physician's office regarding next steps of care.  . When you arrive at healthcare provider, tell the healthcare staff immediately you have returned from visiting [place], [place], [place], [place] or [place]; or traveled in the [place] to  [place], [place], [place], or [place]; in the last two weeks or you have been in close contact with a person diagnosed with COVID-19 in the last 2 weeks.   . Tell the health care staff about your symptoms: fever, cough and shortness of breath. . After you have been seen by a medical provider, you will be either: o Tested for (COVID-19) and discharged home on quarantine except to seek medical care if symptoms worsen, and asked to  - Stay home and avoid contact with others until you get your results (4-5 days)  - Avoid travel on public transportation if possible (such as bus, train, or airplane) or o Sent to the Emergency Department by EMS for evaluation, COVID-19 testing, and possible admission depending on your condition and test results.  What to do if you are LOW RISK for COVID-19?  Reduce your risk of any infection by using the same precautions used for avoiding the common cold or flu:  Marland Kitchen Wash your hands often with soap and warm water for at least 20 seconds.  If soap and water are not readily available, use an alcohol-based hand sanitizer with at least 60% alcohol.  . If coughing or sneezing, cover your mouth and nose by coughing or sneezing into the elbow areas of your shirt or coat, into a tissue or into your sleeve (not your hands). . Avoid shaking hands with others and consider head nods or verbal greetings only. . Avoid touching your eyes, nose, or mouth with unwashed hands.  . Avoid close contact with people who are sick. . Avoid places or events with large numbers of people in one location, like concerts or sporting events. . Carefully consider travel plans you have or are making. . If you are planning any travel outside or inside the [place], visit the CDC's Travelers' Health webpage for the latest health notices. . If you have some symptoms but not all symptoms, continue to monitor at home and seek medical attention if your symptoms worsen. . If you are having a medical  emergency, call 911.   ADDITIONAL HEALTHCARE OPTIONS FOR PATIENTS  [facility] / e-Visit: [email or web address]         [facility]: [phone]  [facility]  [facility]: [phone]                   [facility]: [phone]   

## 2019-08-18 NOTE — Telephone Encounter (Signed)
No entry 

## 2019-08-18 NOTE — Telephone Encounter (Signed)
Per MD - proceed with day 1 of planned chemo today without bone scan.  Informed treatment room nurse

## 2019-08-18 NOTE — Progress Notes (Signed)
Blood return noted before, during, and after Adriamycin IV push. 

## 2019-08-19 ENCOUNTER — Other Ambulatory Visit: Payer: Self-pay | Admitting: Oncology

## 2019-08-19 ENCOUNTER — Telehealth: Payer: Self-pay | Admitting: *Deleted

## 2019-08-19 ENCOUNTER — Telehealth: Payer: Self-pay | Admitting: Pharmacist

## 2019-08-19 DIAGNOSIS — Z170 Estrogen receptor positive status [ER+]: Secondary | ICD-10-CM

## 2019-08-19 DIAGNOSIS — C50411 Malignant neoplasm of upper-outer quadrant of right female breast: Secondary | ICD-10-CM

## 2019-08-19 LAB — CANCER ANTIGEN 27.29: CA 27.29: 27.4 U/mL (ref 0.0–38.6)

## 2019-08-19 MED ORDER — PEGFILGRASTIM INJECTION 6 MG/0.6ML ~~LOC~~: 6.0000 mg | PREFILLED_SYRINGE | Freq: Once | SUBCUTANEOUS | 0 refills | Status: DC

## 2019-08-19 MED ORDER — PEGFILGRASTIM INJECTION 6 MG/0.6ML ~~LOC~~: 6.0000 mg | PREFILLED_SYRINGE | Freq: Once | SUBCUTANEOUS | 0 refills | Status: AC

## 2019-08-19 NOTE — Anesthesia Postprocedure Evaluation (Signed)
Anesthesia Post Note  Patient: Paige Gilbert  Procedure(s) Performed: INSERTION PORT-A-CATH WITH ULTRASOUND (Right Neck)     Patient location during evaluation: PACU Anesthesia Type: General Level of consciousness: awake and alert Pain management: pain level controlled Vital Signs Assessment: post-procedure vital signs reviewed and stable Respiratory status: spontaneous breathing, nonlabored ventilation and respiratory function stable Cardiovascular status: blood pressure returned to baseline and stable Postop Assessment: no apparent nausea or vomiting Anesthetic complications: no    Last Vitals:  Vitals:   08/17/19 1015 08/17/19 1030  BP: 140/84 (!) 141/81  Pulse:  63  Resp:  16  Temp:  36.6 C  SpO2:  100%    Last Pain:  Vitals:   08/17/19 1030  TempSrc:   PainSc: 0-No pain   Pain Goal:                   Lynda Rainwater

## 2019-08-20 ENCOUNTER — Other Ambulatory Visit: Payer: Self-pay | Admitting: Oncology

## 2019-08-20 ENCOUNTER — Inpatient Hospital Stay: Payer: Managed Care, Other (non HMO)

## 2019-08-20 ENCOUNTER — Other Ambulatory Visit: Payer: Self-pay

## 2019-08-20 ENCOUNTER — Telehealth: Payer: Self-pay | Admitting: Oncology

## 2019-08-20 VITALS — BP 120/68 | HR 65 | Temp 97.8°F | Resp 16

## 2019-08-20 DIAGNOSIS — Z170 Estrogen receptor positive status [ER+]: Secondary | ICD-10-CM

## 2019-08-20 DIAGNOSIS — Z5111 Encounter for antineoplastic chemotherapy: Secondary | ICD-10-CM | POA: Diagnosis not present

## 2019-08-20 DIAGNOSIS — C50411 Malignant neoplasm of upper-outer quadrant of right female breast: Secondary | ICD-10-CM

## 2019-08-20 MED ORDER — PEGFILGRASTIM-JMDB 6 MG/0.6ML ~~LOC~~ SOSY: 6.0000 mg | PREFILLED_SYRINGE | Freq: Once | SUBCUTANEOUS | Status: DC

## 2019-08-20 MED ORDER — PEGFILGRASTIM-JMDB 6 MG/0.6ML ~~LOC~~ SOSY
PREFILLED_SYRINGE | SUBCUTANEOUS | Status: AC
  Filled 2019-08-20: qty 0.6

## 2019-08-20 MED ORDER — FILGRASTIM-AAFI 480 MCG/0.8ML IJ SOSY
480.0000 ug | PREFILLED_SYRINGE | Freq: Once | INTRAMUSCULAR | Status: AC
  Administered 2019-08-20: 480 ug via SUBCUTANEOUS
  Filled 2019-08-20: qty 0.8

## 2019-08-20 NOTE — Telephone Encounter (Signed)
Oral Chemotherapy Pharmacist Encounter   Received notification on 08/19/19 from infusion pharmacy/infusion PA team that the Neulasta for Ms. Darrough was required to be given at home through a specialty pharmacy. Prescription was sent to [facility].  Prescription required an authorization through [employer]. Authorization submitted on 1/22. Status on 1/22 states "pending medical director review".  We will continue to follow the status.  Darl Pikes, PharmD, BCPS, [facility] Hematology/Oncology Clinical Pharmacist [facility]/AP [facility] [phone]  08/20/2019 1:06 PM

## 2019-08-20 NOTE — Addendum Note (Signed)
Addended by: Juliane Poot on: 08/20/2019 02:50 PM   Modules accepted: Orders

## 2019-08-20 NOTE — Progress Notes (Signed)
Low stock on [place].  Verbal order rcv'd from Dr. Jana Hakim for 1-time Zarxio 480 mcg on 08/21/19 only.  Weekend RN aware.  Peer-to-peer w/ Christella Scheuermann pending per Dr. Jana Hakim.  PA team aware and involved.  Kennith Center, Pharm.D., CPP 08/20/2019@4 :43 PM

## 2019-08-20 NOTE — Progress Notes (Signed)
Discussed MRI results with Dr. Brantley Stage.  The plan is for mastectomy and therefore we are not proceeding with additional biopsies at this time.

## 2019-08-20 NOTE — Progress Notes (Signed)
Patient did not receive injection today due to insurance approvals not received.

## 2019-08-20 NOTE — Patient Instructions (Addendum)

## 2019-08-20 NOTE — Telephone Encounter (Signed)
I talk with patient regarding schedule  

## 2019-08-21 ENCOUNTER — Inpatient Hospital Stay: Payer: Managed Care, Other (non HMO)

## 2019-08-21 ENCOUNTER — Other Ambulatory Visit: Payer: Self-pay

## 2019-08-21 VITALS — BP 127/71 | HR 88 | Temp 98.0°F | Resp 20

## 2019-08-21 DIAGNOSIS — Z5111 Encounter for antineoplastic chemotherapy: Secondary | ICD-10-CM | POA: Diagnosis not present

## 2019-08-21 DIAGNOSIS — Z170 Estrogen receptor positive status [ER+]: Secondary | ICD-10-CM

## 2019-08-21 DIAGNOSIS — C50411 Malignant neoplasm of upper-outer quadrant of right female breast: Secondary | ICD-10-CM

## 2019-08-21 MED ORDER — FILGRASTIM-SNDZ 480 MCG/0.8ML IJ SOSY
480.0000 ug | PREFILLED_SYRINGE | Freq: Once | INTRAMUSCULAR | Status: AC
  Administered 2019-08-21: 480 ug via SUBCUTANEOUS

## 2019-08-23 ENCOUNTER — Inpatient Hospital Stay: Payer: Managed Care, Other (non HMO)

## 2019-08-23 ENCOUNTER — Other Ambulatory Visit: Payer: Self-pay

## 2019-08-23 VITALS — BP 110/66 | HR 70 | Temp 98.2°F | Resp 18

## 2019-08-23 DIAGNOSIS — C50411 Malignant neoplasm of upper-outer quadrant of right female breast: Secondary | ICD-10-CM

## 2019-08-23 DIAGNOSIS — Z5111 Encounter for antineoplastic chemotherapy: Secondary | ICD-10-CM | POA: Diagnosis not present

## 2019-08-23 DIAGNOSIS — Z170 Estrogen receptor positive status [ER+]: Secondary | ICD-10-CM

## 2019-08-23 MED ORDER — FILGRASTIM-SNDZ 480 MCG/0.8ML IJ SOSY
480.0000 ug | PREFILLED_SYRINGE | Freq: Once | INTRAMUSCULAR | Status: AC
  Administered 2019-08-23: 480 ug via SUBCUTANEOUS

## 2019-08-23 MED ORDER — FILGRASTIM-SNDZ 480 MCG/0.8ML IJ SOSY
PREFILLED_SYRINGE | INTRAMUSCULAR | Status: AC
  Filled 2019-08-23: qty 0.8

## 2019-08-23 NOTE — Patient Instructions (Signed)

## 2019-08-23 NOTE — Progress Notes (Signed)
08/23/19  Per [employer] Zarxio is the preferred agent and does not require insurance authorization.  Orders changed to reflect this and Sharin Mons has been discontinued.  Zarxio 480 mcg entered into plan.  Henreitta Leber, PharmD

## 2019-08-24 ENCOUNTER — Inpatient Hospital Stay: Payer: Managed Care, Other (non HMO)

## 2019-08-24 VITALS — BP 118/62 | HR 72 | Temp 98.2°F | Resp 18

## 2019-08-24 DIAGNOSIS — C50411 Malignant neoplasm of upper-outer quadrant of right female breast: Secondary | ICD-10-CM

## 2019-08-24 DIAGNOSIS — Z5111 Encounter for antineoplastic chemotherapy: Secondary | ICD-10-CM | POA: Diagnosis not present

## 2019-08-24 MED ORDER — FILGRASTIM-SNDZ 480 MCG/0.8ML IJ SOSY
480.0000 ug | PREFILLED_SYRINGE | Freq: Once | INTRAMUSCULAR | Status: AC
  Administered 2019-08-24: 480 ug via SUBCUTANEOUS

## 2019-08-24 MED ORDER — FILGRASTIM-SNDZ 480 MCG/0.8ML IJ SOSY
PREFILLED_SYRINGE | INTRAMUSCULAR | Status: AC
  Filled 2019-08-24: qty 0.8

## 2019-08-24 NOTE — Patient Instructions (Signed)

## 2019-08-24 NOTE — Progress Notes (Signed)
[place]  [phone] [phone]     ID: Paige Gilbert DOB: 1955-08-21  MR#: [id number]  BMW#:[id number]  Patient Care Team: [name], NP as PCP - General (Family Medicine) [name], RN as Oncology Nurse Navigator [name], RN as Oncology Nurse Navigator Braxon Suder, [name], MD as Consulting Physician (Oncology) [name], MD as Attending Physician (Radiation Oncology) [name], MD as Consulting Physician (General Surgery) [name], MD OTHER MD:  CHIEF COMPLAINT: Locally advanced estrogen receptor positive breast cancer  CURRENT TREATMENT: Neoadjuvant chemotherapy   INTERVAL HISTORY: Paige Gilbert returns today for follow up of her locally advanced breast cancer accompanied by her husband.  Since her last visit, she underwent breast MRI on 08/15/2019, which revealed: extensive enhancement involving the entire upper-outer and portions of the upper-inner right breast at site of patient's known malignancy, spanning approximately 7.6 cm; two additional, suspicious 5-6 mm enhancing masses in the inferior right breast; five suspicious level 1 right axillary lymph nodes; no evidence of left breast malignancy.  She underwent port placement on 08/17/2019, which she tolerated well.  She began neoadjuvant chemotherapy consisting of cyclophosphamide and doxorubicin on 08/18/2019.  She was scheduled to receive Neulasta on day 3, and apparently that was approved by insurance for subsequent cycles but there was some delay because her insurance wants her to purchase her Neulasta from pharmacy and self administer it.  When the patient returned on day 3 for Neulasta  I discussed the situation with the insurance and a peer to peer and they agreed to 4 days of Granix which the patient received instead of the Neulasta.  This of course was very inconvenient for her.  Subsequent treatments though should be as originally planned, except  that the patient will self administer the Neulasta.  She will need to be trained to do this.  Her bone scan has been rescheduled for 08/26/2018   REVIEW OF SYSTEMS: Paige Gilbert tells me that the night of chemo after getting home was "not bad".  She had a little bit of mild nausea, but no vomiting.  She felt her abdomen was full of "rocks".  She was able to eat and drink and her sense of taste was fine.  On Thursday she felt about the same, a little heavy but again no significant nausea and no vomiting.  Thursday, after receiving her first Neupogen shot it was "rough".  She had pressure in her head, her temples pounded, she had no bone pain however.  She did not take her Claritin.  The next day Saturday morning she felt considerably better.  She had no problems with her port, no mouth sores, and only minimal constipation.  A detailed review of systems was otherwise stable.   HISTORY OF CURRENT ILLNESS: From the original intake note:  Paige Gilbert has noted a difference between her right and left breast for several years.  She last had a mammogram she thinks about 7 or 8 years ago.  More recently it seemed to her that her right breast was becoming more firm.  She brought it to medical attention and underwent bilateral diagnostic mammography with tomography and right breast ultrasonography at [facility] on 07/07/2019 showing: heterogeneously dense breast tissue; right nipple retraction; architectural distortion with microcalcifications and irregular dense tissue in the upper-outer quadrant; at least 3 abnormal right axillary lymph nodes.  Accordingly on 07/13/2019 she proceeded to biopsy of the right breast area in question. The pathology from this procedure ([id number]) showed:  1. Right Breast, upper-outer quadrant, closer to the nipple  - invasive ductal carcinoma with micropapillary features, grade 2  - ductal carcinoma in situ, grade 2-3, solid and cribriform pattern with comedonecrosis  -  Prognostic indicators significant for: estrogen receptor, 100% positive with strong staining intensity and progesterone receptor, 0% negative. HER2 equivocal by immunohistochemistry (2+), but negative by fluorescent in situ hybridization with a signals ratio 1.5 and number per cell 3.8.  2. Right Breast, upper-outer quadrant, axillary tail  - invasive ductal carcinoma, grade 2  - ductal carcinoma in situ, grade 2-3  - Prognostic indicators significant for: estrogen receptor, 100% positive and progesterone receptor, 50% positive, both with strong staining intensity. HER2 equivocal by immunohistochemistry (2+), but negative by fluorescent in situ hybridization with a signals ratio 1.9 and number per cell 5.3.  The patient's subsequent history is as detailed below.   PAST MEDICAL HISTORY: Past Medical History:  Diagnosis Date  . Anemia   . right breast ca dx'd 06/2019  . Varicose vein   . Varicose veins of left lower extremity    "bluging ones at top- no pain"    PAST SURGICAL HISTORY: Past Surgical History:  Procedure Laterality Date  . NO PAST SURGERIES    . none    . PORTACATH PLACEMENT Right 08/17/2019   Procedure: INSERTION PORT-A-CATH WITH ULTRASOUND;  Surgeon: [name], MD;  Location: [place];  Service: General;  Laterality: Right;  . wisdom teeth removal     remotely.     FAMILY HISTORY: Family History  Problem Relation Age of Onset  . Diabetes Father   . Blindness Mother   . Congestive Heart Failure Sister   . Gallbladder disease Sister        removed  . Diabetes Sister   . Colitis Sister    The patient's father died at age 68 from a stroke.  The patient's mother died from multiple medical problems not related to cancer at age 29.  The patient had 4 brothers, 7 sisters.  There is no breast ovarian or prostate cancer in the family to her knowledge.  She had one cousin with pancreatic cancer.  GYNECOLOGIC HISTORY:  No LMP recorded. Patient is  postmenopausal. Menarche: 64 years old Age at first live birth: 64 years old [place] P 5 LMP 58 Contraceptive: Brief use of oral contraceptives as a teenager HRT no Hysterectomy?  No BSO?    SOCIAL HISTORY: (updated 07/2019)  Madelena works in a Engineer, agricultural, and Education administrator houses.  Her husband of 20 years [name] is a Chief Operating Officer.  Their children are [name], 66, who lives in [place] and is a Veterinary surgeon in the [employer]; [name] 33 who works as a Designer, jewellery at [employer]; [name] who lives in [place], and is not currently employed; Parkland lives in [place], and [name] who is an Armed forces training and education officer in [place].  The patient has 4 grandchildren all under the age of 29    ADVANCED DIRECTIVES: Husband [name] is her HCPOA.   HEALTH MAINTENANCE: Social History   Tobacco Use  . Smoking status: Never Smoker  . Smokeless tobacco: Never Used  Substance Use Topics  . Alcohol use: Yes    Comment: rarely  . Drug use: No     Colonoscopy: never done  PAP: Remote  Bone density: Never   No Known Allergies  Current Outpatient Medications  Medication Sig Dispense Refill  . Calcium Citrate-Vitamin D (CALCIUM CITRATE + D PO) Take 1 tablet by  mouth 2 (two) times daily.     Marland Kitchen dexamethasone (DECADRON) 4 MG tablet Take 2 tablets by mouth once a day on the day after chemotherapy and then take 2 tablets two times a day for 2 days. Take with food. 30 tablet 1  . ibuprofen (ADVIL) 800 MG tablet Take 1 tablet (800 mg total) by mouth every 8 (eight) hours as needed. 30 tablet 0  . lidocaine-prilocaine (EMLA) cream Apply to affected area once 30 g 3  . loratadine (CLARITIN) 10 MG tablet Take 1 tablet (10 mg total) by mouth daily. (Patient taking differently: Take 10 mg by mouth daily. Take day 2,  3 , 4------------chemo) 60 tablet 1  . LORazepam (ATIVAN) 0.5 MG tablet Take 1 tablet (0.5 mg total) by mouth at bedtime as needed (Nausea or vomiting). 30 tablet 0  .  pegfilgrastim (NEULASTA) 6 MG/0.6ML injection Inject 0.6 mLs (6 mg total) into the skin every 14 (fourteen) days. 1 Syringe 2  . prochlorperazine (COMPAZINE) 10 MG tablet Take 1 tablet (10 mg total) by mouth every 6 (six) hours as needed (Nausea or vomiting). 30 tablet 1   No current facility-administered medications for this visit.    OBJECTIVE: Middle-aged white woman in no acute distress  Vitals:   08/25/19 1317  BP: 116/67  Pulse: 78  Resp: 19  Temp: 97.8 F (36.6 C)  SpO2: 100%     Body mass index is 23.27 kg/m.   Wt Readings from Last 3 Encounters:  08/25/19 137 lb 11.2 oz (62.5 kg)  08/18/19 142 lb 9 oz (64.7 kg)  08/17/19 139 lb 4.8 oz (63.2 kg)      ECOG FS:1 - Symptomatic but completely ambulatory  Sclerae unicteric, EOMs intact Wearing a mask No cervical or supraclavicular adenopathy Lungs no rales or rhonchi Heart regular rate and rhythm Abd soft, nontender, positive bowel sounds MSK no focal spinal tenderness, no upper extremity lymphedema Neuro: nonfocal, well oriented, appropriate affect Breasts: The right breast continues to show significant induration chiefly in the upper outer quadrant, with significant breast distortion as imaged below.  Left breast was benign.  Both axillae are benign.   Right breast 08/13/2018     LAB RESULTS:  CMP     Component Value Date/Time   NA 138 08/25/2019 1310   K 4.1 08/25/2019 1310   CL 105 08/25/2019 1310   CO2 25 08/25/2019 1310   GLUCOSE 132 (H) 08/25/2019 1310   BUN 15 08/25/2019 1310   CREATININE 0.73 08/25/2019 1310   CREATININE 0.79 08/04/2019 1546   CREATININE 0.76 03/27/2015 1146   CALCIUM 8.5 (L) 08/25/2019 1310   PROT 6.1 (L) 08/25/2019 1310   ALBUMIN 3.6 08/25/2019 1310   AST 29 08/25/2019 1310   AST 17 08/04/2019 1546   ALT 86 (H) 08/25/2019 1310   ALT 21 08/04/2019 1546   ALKPHOS 87 08/25/2019 1310   BILITOT 0.3 08/25/2019 1310   BILITOT 0.2 (L) 08/04/2019 1546   GFRNONAA >60 08/25/2019  1310   GFRNONAA >60 08/04/2019 1546   GFRNONAA 87 03/27/2015 1146   GFRAA >60 08/25/2019 1310   GFRAA >60 08/04/2019 1546   GFRAA >89 03/27/2015 1146    No results found for: TOTALPROTELP, ALBUMINELP, A1GS, A2GS, BETS, BETA2SER, GAMS, MSPIKE, SPEI  Lab Results  Component Value Date   WBC 3.6 (L) 08/25/2019   NEUTROABS 2.3 08/25/2019   HGB 11.5 (L) 08/25/2019   HCT 34.3 (L) 08/25/2019   MCV 86.6 08/25/2019   PLT 113 (L)  08/25/2019    No results found for: LABCA2  No components found for: PPIRJJ884  No results for input(s): INR in the last 168 hours.  No results found for: LABCA2  No results found for: CAN199  No results found for: ZYS063  No results found for: KZS010  Lab Results  Component Value Date   CA2729 27.4 08/18/2019    No components found for: HGQUANT  No results found for: CEA1 / No results found for: CEA1   No results found for: AFPTUMOR  No results found for: CHROMOGRNA  No results found for: KPAFRELGTCHN, LAMBDASER, KAPLAMBRATIO (kappa/lambda light chains)  No results found for: HGBA, HGBA2QUANT, HGBFQUANT, HGBSQUAN (Hemoglobinopathy evaluation)   No results found for: LDH  Lab Results  Component Value Date   IRON 94 02/15/2015   TIBC 272 02/15/2015   IRONPCTSAT 35 02/15/2015   (Iron and TIBC)  Lab Results  Component Value Date   FERRITIN 70 02/15/2015    Urinalysis No results found for: COLORURINE, APPEARANCEUR, LABSPEC, PHURINE, GLUCOSEU, HGBUR, BILIRUBINUR, KETONESUR, PROTEINUR, UROBILINOGEN, NITRITE, LEUKOCYTESUR   STUDIES: CT Chest W Contrast  Result Date: 08/10/2019 CLINICAL DATA:  RT breast cancer diagnosed 12/20, surgery, chemo, XRT and oral chemo has all been planned, but not started yet. EXAM: CT CHEST WITH CONTRAST TECHNIQUE: Multidetector CT imaging of the chest was performed during intravenous contrast administration. CONTRAST:  14m OMNIPAQUE IOHEXOL 300 MG/ML  SOLN COMPARISON:  None. FINDINGS: Cardiovascular: No  significant vascular findings. Normal heart size. No pericardial effusion. Mediastinum/Nodes: Mass within the posterior aspect of the RIGHT breast measures 2.6 by 1.7 cm. Adjacent high-density RIGHT axial lymph node measures 7 mm short axis (image 23/2). No internal mammary adenopathy. Supraclavicular or mediastinal adenopathy. Lungs/Pleura: Small 2 mm subpleural nodule in the LEFT upper lobe (image 54/4). Airways normal. Upper Abdomen: Limited view of the liver, kidneys, pancreas are unremarkable. Normal adrenal glands. Musculoskeletal: No aggressive osseous lesion. IMPRESSION: 1. Mass in the posterior aspect of the RIGHT breast consistent with primary breast carcinoma. 2. Adjacent RIGHT axial lymph node is concerning for nodal metastasis. 3. No central lymphadenopathy. 4. Small 2 mm subpleural nodule in the LEFT upper lobe is favored benign. Electronically Signed   By: SSuzy BouchardM.D.   On: 08/10/2019 09:21   MR BREAST BILATERAL W WO CONTRAST INC CAD  Addendum Date: 08/18/2019   ADDENDUM REPORT: 08/18/2019 07:49 ADDENDUM: This is an addendum to the report originally dictated on 08/16/2019. All additional positive findings are within the RIGHT breast. Additional biopsy recommendations are also for the RIGHT breast. There is no MRI evidence for malignancy on the left. The IMPRESSION and RECOMMENDATION should read as follows. IMPRESSION: 1. Extensive enhancement involving the entire upper outer and portions of the upper inner right breast at the site of patient's known malignancy. This spans approximately 7.6 x 3.9 x 5.9 cm (transverse by AP by craniocaudal dimensions). 2. Two additional, suspicious 5-6 mm enhancing masses in the inferior RIGHT breast. MRI guided biopsy can be performed if this will alter clinical management. 3. Five suspicious level 1 right axillary lymph nodes. 4. No MRI evidence of malignancy on the left. RECOMMENDATION: MRI guided biopsy of two additional RIGHT breast masses can be  performed if this will alter clinical/surgical management. BI-RADS CATEGORY  4: Suspicious. Electronically Signed   By: SKristopher OppenheimM.D.   On: 08/18/2019 07:49   Result Date: 08/18/2019 CLINICAL DATA:  64year old female with recently diagnosed right breast cancer presents for staging prior to neoadjuvant chemotherapy.  LABS:  None performed on site. EXAM: BILATERAL BREAST MRI WITH AND WITHOUT CONTRAST TECHNIQUE: Multiplanar, multisequence MR images of both breasts were obtained prior to and following the intravenous administration of 6 ml of Gadavist. Three-dimensional MR images were rendered by post-processing of the original MR data on an independent workstation. The three-dimensional MR images were interpreted, and findings are reported in the following complete MRI report for this study. Three dimensional images were evaluated at the independent DynaCad workstation COMPARISON:  Previous exam(s). FINDINGS: Breast composition: c. Heterogeneous fibroglandular tissue. Background parenchymal enhancement: Mild. Right breast: Extensive, contiguous enhancement is seen involving the entire upper outer quadrant and portions of the upper inner quadrant. This spans approximately 7.6 x 3.9 x 5.9 cm (transverse by AP by craniocaudal dimensions). Two additional 5-6 mm enhancing masses are seen in the central inferior left breast at anterior and middle depth (series 6, image 77 and 80 3/144). Left breast: No mass or abnormal enhancement. Lymph nodes: Five suspicious level 1 right axillary lymph nodes are noted, with diffuse cortical thickening and hilar effacement. There are no suspicious right axillary or internal mammary chain lymph nodes. Ancillary findings:  None. IMPRESSION: 1. Extensive enhancement involving the entire upper outer and portions of the upper inner right breast at the site of patient's known malignancy. This spans approximately 7.6 x 3.9 x 5.9 cm (transverse by AP by craniocaudal dimensions). 2. Two  additional, suspicious 5-6 mm enhancing masses in the inferior left breast. MRI guided biopsy can be performed if this will alter clinical management. 3. Five suspicious level 1 right axillary lymph nodes. 4. No MRI evidence of malignancy on the left. RECOMMENDATION: MRI guided biopsy of two additional left breast masses can be performed if this will alter clinical/surgical management. BI-RADS CATEGORY  4: Suspicious. Electronically Signed: By: Kristopher Oppenheim M.D. On: 08/16/2019 12:23   DG Chest Port 1 View  Result Date: 08/17/2019 CLINICAL DATA:  Check Port-A-Cath placement EXAM: PORTABLE CHEST 1 VIEW COMPARISON:  None. FINDINGS: Cardiac shadows within normal limits. Right chest wall port is now seen with the catheter tip at the cavoatrial junction. No pneumothorax is noted. Lungs are clear. No bony abnormality is noted. IMPRESSION: Right chest wall port in satisfactory position. No pneumothorax is noted. Electronically Signed   By: Inez Catalina M.D.   On: 08/17/2019 09:42   DG Fluoro Guide CV Line-No Report  Result Date: 08/17/2019 Fluoroscopy was utilized by the requesting physician.  No radiographic interpretation.   ECHOCARDIOGRAM COMPLETE  Result Date: 08/10/2019   ECHOCARDIOGRAM REPORT   Patient Name:   Paige Gilbert Date of Exam: 08/10/2019 Medical Rec #:  [id number]         Height:       64.5 in Accession #:    [id number]        Weight:       141.0 lb Date of Birth:  [birth year]         BSA:          1.70 m Patient Age:    64 years          BP:           124/79 mmHg Patient Gender: F                 HR:           60 bpm. Exam Location:  Outpatient Procedure: 2D Echo, Cardiac Doppler, Color Doppler and Strain Analysis Indications:    Z51.11 Encounter for antineoplastic chemotheraphy  History:        Patient has no prior history of Echocardiogram examinations.                 Breast Cancer.  Sonographer:    Jonelle Sidle Dance Referring Phys: Industry  1. Left ventricular  ejection fraction, by visual estimation, is 60 to 65%. The left ventricle has normal function. There is no left ventricular hypertrophy.  2. The left ventricle has no regional wall motion abnormalities.  3. The average left ventricular global longitudinal strain is -16.9 %.  4. Global right ventricle has normal systolic function.The right ventricular size is normal. No increase in right ventricular wall thickness.  5. Left atrial size was normal.  6. Right atrial size was normal.  7. The tricuspid valve is grossly normal.  8. The aortic valve is tricuspid. Aortic valve regurgitation is not visualized.  9. The pulmonic valve was grossly normal. Pulmonic valve regurgitation is not visualized. 10. Normal pulmonary artery systolic pressure. 11. The inferior vena cava is normal in size with greater than 50% respiratory variability, suggesting right atrial pressure of 3 mmHg. 12. The mitral valve is grossly normal. No evidence of mitral valve regurgitation. FINDINGS  Left Ventricle: Left ventricular ejection fraction, by visual estimation, is 60 to 65%. The left ventricle has normal function. The average left ventricular global longitudinal strain is -16.9 %. The left ventricle has no regional wall motion abnormalities. There is no left ventricular hypertrophy. Left ventricular diastolic parameters were normal. Normal left atrial pressure. Right Ventricle: The right ventricular size is normal. No increase in right ventricular wall thickness. Global RV systolic function is has normal systolic function. The tricuspid regurgitant velocity is 2.03 m/s, and with an assumed right atrial pressure  of 3 mmHg, the estimated right ventricular systolic pressure is normal at 19.5 mmHg. Left Atrium: Left atrial size was normal in size. Right Atrium: Right atrial size was normal in size Pericardium: There is no evidence of pericardial effusion. Mitral Valve: The mitral valve is grossly normal. No evidence of mitral valve regurgitation.  Tricuspid Valve: The tricuspid valve is grossly normal. Tricuspid valve regurgitation is trivial. Aortic Valve: The aortic valve is tricuspid. Aortic valve regurgitation is not visualized. Pulmonic Valve: The pulmonic valve was grossly normal. Pulmonic valve regurgitation is not visualized. Pulmonic regurgitation is not visualized. Aorta: The aortic root and ascending aorta are structurally normal, with no evidence of dilitation. Venous: The inferior vena cava is normal in size with greater than 50% respiratory variability, suggesting right atrial pressure of 3 mmHg. IAS/Shunts: No atrial level shunt detected by color flow Doppler.  LEFT VENTRICLE PLAX 2D LVIDd:         4.30 cm  Diastology LVIDs:         2.80 cm  LV e' lateral:   10.75 cm/s LV PW:         0.80 cm  LV E/e' lateral: 6.3 LV IVS:        0.70 cm  LV e' medial:    8.16 cm/s LVOT diam:     1.80 cm  LV E/e' medial:  8.2 LV SV:         54 ml LV SV Index:   31.23    2D Longitudinal Strain LVOT Area:     2.54 cm 2D Strain GLS Avg:     -16.9 %  RIGHT VENTRICLE             IVC RV Basal diam:  2.50 cm     IVC diam: 1.30 cm RV S prime:     11.70 cm/s TAPSE (M-mode): 2.4 cm LEFT ATRIUM             Index       RIGHT ATRIUM           Index LA diam:        3.00 cm 1.77 cm/m  RA Area:     14.20 cm LA Vol (A2C):   57.9 ml 34.15 ml/m RA Volume:   31.80 ml  18.76 ml/m LA Vol (A4C):   26.3 ml 15.51 ml/m LA Biplane Vol: 40.0 ml 23.59 ml/m  AORTIC VALVE LVOT Vmax:   80.90 cm/s LVOT Vmean:  50.700 cm/s LVOT VTI:    0.172 m  AORTA Ao Root diam: 3.10 cm Ao Asc diam:  2.70 cm MITRAL VALVE                        TRICUSPID VALVE MV Area (PHT): 4.21 cm             TR Peak grad:   16.5 mmHg MV PHT:        52.20 msec           TR Vmax:        203.00 cm/s MV Decel Time: 180 msec MV E velocity: 67.30 cm/s 103 cm/s  SHUNTS MV A velocity: 52.70 cm/s 70.3 cm/s Systemic VTI:  0.17 m MV E/A ratio:  1.28       1.5       Systemic Diam: 1.80 cm  Lyman Bishop MD Electronically signed  by Lyman Bishop MD Signature Date/Time: 08/10/2019/1:38:37 PM    Final     ELIGIBLE FOR AVAILABLE RESEARCH PROTOCOL:   ASSESSMENT: 64 y.o. [place], [place] woman status post right breast biopsy x2 on 07/13/2019 for a clinical T3-T4 N1-2, stage III invasive ductal carcinoma, grade 2, estrogen receptor positive, progesterone receptor variable, HER-2 not amplified.  (a) breast MRI 08/15/2019 showed a 7.6 area of enhancement, and 5 suspicious right axillary lymph nodes as well as two 0.5 mm additional masses in the inferior right breast   (1) neoadjuvant chemotherapy consisting of cyclophosphamide and doxorubicin in dose dense fashion x4 starting 08/18/2019, to be followed by weekly paclitaxel x12  (2) definitive surgery (right modified radical mastectomy) to follow  (3) adjuvant radiation as appropriate  (4) antiestrogens to start at the completion of local treatment   PLAN: Paige Gilbert did generally well with her first cycle of chemotherapy.  She did not take her Claritin and I encouraged her to do that.  In fact I encouraged her to take all her supportive medicines exactly as in the routing sheet we gave her originally.  I think that will help her do even better in the next 3 cycles.  Her counts today are quite good.  Recall she did not receive Neulasta but had 4 doses of Granix for insurance reasons.  Her Neulasta has been approved but she will have to purchase it herself and self administer it.  I have contacted the pharmacy here and they will have Neulasta for her this Friday.  She will call before coming to pick it up just to make sure they have it (I gave her the pharmacy number) and she will also ask what her out-of-pocket cost is going to be.  If it is going to be prohibitive she will call Monday and let me know.  At that time in any case we will be talking about her bone scan results due this Friday.    Total encounter time 35 minutes.[name], MD   08/25/2019 2:24  PM Medical Oncology and Hematology [facility] [place],  [address] Tel. [phone]    Fax. [phone]   This document serves as a record of services personally performed by Lurline Del, MD. It was created on his behalf by Wilburn Mylar, a trained medical scribe. The creation of this record is based on the scribe's personal observations and the provider's statements to them.   I, Lurline Del MD, have reviewed the above documentation for accuracy and completeness, and I agree with the above.   *Total Encounter Time as defined by the Centers for Medicare and Medicaid Services includes, in addition to the face-to-face time of a patient visit (documented in the note above) non-face-to-face time: obtaining and reviewing outside history, ordering and reviewing medications, tests or procedures, care coordination (communications with other health care professionals or caregivers) and documentation in the medical record.

## 2019-08-25 ENCOUNTER — Inpatient Hospital Stay: Payer: Managed Care, Other (non HMO) | Admitting: Oncology

## 2019-08-25 ENCOUNTER — Telehealth: Payer: Self-pay

## 2019-08-25 ENCOUNTER — Inpatient Hospital Stay: Payer: Managed Care, Other (non HMO)

## 2019-08-25 ENCOUNTER — Encounter: Payer: Self-pay | Admitting: *Deleted

## 2019-08-25 ENCOUNTER — Other Ambulatory Visit: Payer: Self-pay | Admitting: Pharmacist

## 2019-08-25 ENCOUNTER — Other Ambulatory Visit: Payer: Self-pay

## 2019-08-25 VITALS — BP 116/67 | HR 78 | Temp 97.8°F | Resp 19 | Ht 64.5 in | Wt 137.7 lb

## 2019-08-25 DIAGNOSIS — C50411 Malignant neoplasm of upper-outer quadrant of right female breast: Secondary | ICD-10-CM

## 2019-08-25 DIAGNOSIS — Z5111 Encounter for antineoplastic chemotherapy: Secondary | ICD-10-CM | POA: Diagnosis not present

## 2019-08-25 DIAGNOSIS — Z170 Estrogen receptor positive status [ER+]: Secondary | ICD-10-CM

## 2019-08-25 LAB — CBC WITH DIFFERENTIAL/PLATELET
Abs Immature Granulocytes: 0.1 10*3/uL — ABNORMAL HIGH (ref 0.00–0.07)
Basophils Absolute: 0 10*3/uL (ref 0.0–0.1)
Basophils Relative: 1 %
Eosinophils Absolute: 0.1 10*3/uL (ref 0.0–0.5)
Eosinophils Relative: 2 %
HCT: 34.3 % — ABNORMAL LOW (ref 36.0–46.0)
Hemoglobin: 11.5 g/dL — ABNORMAL LOW (ref 12.0–15.0)
Immature Granulocytes: 3 %
Lymphocytes Relative: 27 %
Lymphs Abs: 1 10*3/uL (ref 0.7–4.0)
MCH: 29 pg (ref 26.0–34.0)
MCHC: 33.5 g/dL (ref 30.0–36.0)
MCV: 86.6 fL (ref 80.0–100.0)
Monocytes Absolute: 0.1 10*3/uL (ref 0.1–1.0)
Monocytes Relative: 4 %
Neutro Abs: 2.3 10*3/uL (ref 1.7–7.7)
Neutrophils Relative %: 63 %
Platelets: 113 10*3/uL — ABNORMAL LOW (ref 150–400)
RBC: 3.96 MIL/uL (ref 3.87–5.11)
RDW: 12.5 % (ref 11.5–15.5)
WBC: 3.6 10*3/uL — ABNORMAL LOW (ref 4.0–10.5)
nRBC: 0 % (ref 0.0–0.2)

## 2019-08-25 LAB — COMPREHENSIVE METABOLIC PANEL
ALT: 86 U/L — ABNORMAL HIGH (ref 0–44)
AST: 29 U/L (ref 15–41)
Albumin: 3.6 g/dL (ref 3.5–5.0)
Alkaline Phosphatase: 87 U/L (ref 38–126)
Anion gap: 8 (ref 5–15)
BUN: 15 mg/dL (ref 8–23)
CO2: 25 mmol/L (ref 22–32)
Calcium: 8.5 mg/dL — ABNORMAL LOW (ref 8.9–10.3)
Chloride: 105 mmol/L (ref 98–111)
Creatinine, Ser: 0.73 mg/dL (ref 0.44–1.00)
GFR calc Af Amer: >60 mL/min (ref >60)
GFR calc non Af Amer: >60 mL/min (ref >60)
Glucose, Bld: 132 mg/dL — ABNORMAL HIGH (ref 70–99)
Potassium: 4.1 mmol/L (ref 3.5–5.1)
Sodium: 138 mmol/L (ref 135–145)
Total Bilirubin: 0.3 mg/dL (ref 0.3–1.2)
Total Protein: 6.1 g/dL — ABNORMAL LOW (ref 6.5–8.1)

## 2019-08-25 MED ORDER — HEPARIN SOD (PORK) LOCK FLUSH 100 UNIT/ML IV SOLN
500.0000 [IU] | Freq: Once | INTRAVENOUS | Status: AC | PRN
  Administered 2019-08-25: 500 [IU]
  Filled 2019-08-25: qty 5

## 2019-08-25 MED ORDER — SODIUM CHLORIDE 0.9% FLUSH
10.0000 mL | INTRAVENOUS | Status: DC | PRN
  Administered 2019-08-25: 10 mL
  Filled 2019-08-25: qty 10

## 2019-08-25 MED ORDER — NEULASTA 6 MG/0.6ML ~~LOC~~ SOSY: 6.0000 mg | PREFILLED_SYRINGE | SUBCUTANEOUS | 2 refills | Status: DC

## 2019-08-25 MED FILL — NEULASTA 6 MG/0.6 ML SYRN: 6 | 14 days supply | Qty: 1 | Fill #0

## 2019-08-25 NOTE — Telephone Encounter (Signed)
Oral Oncology Patient Advocate Encounter  After completing a benefits investigation, prior authorization for Neulasta is  required through [employer].  Prior authorization was obtained.  Patient's copay is $133.43.  Also obtained a copay card for [facility] [id number]  ID [id number] Confirmation [id number]  If credit card is needed to process remaining balance [phone] cvc [phone] Exp 09/26/23    [place] [facility] Phone [phone] Fax [phone] 08/25/2019 10:54 AM

## 2019-08-26 ENCOUNTER — Telehealth: Payer: Self-pay | Admitting: Oncology

## 2019-08-26 ENCOUNTER — Other Ambulatory Visit: Payer: Managed Care, Other (non HMO)

## 2019-08-26 NOTE — Telephone Encounter (Signed)
I talk with patient regarding schedule  

## 2019-08-27 ENCOUNTER — Other Ambulatory Visit: Payer: Self-pay

## 2019-08-27 ENCOUNTER — Encounter (HOSPITAL_COMMUNITY)
Admit: 2019-08-27 | Discharge: 2019-08-27 | Disposition: A | Discharge status: Home / Self Care | Payer: Managed Care, Other (non HMO) | Attending: Oncology | Admitting: Oncology

## 2019-08-27 ENCOUNTER — Encounter (HOSPITAL_COMMUNITY)
Admission: RE | Admit: 2019-08-27 | Discharge: 2019-08-27 | Disposition: A | Discharge status: Home / Self Care | Payer: Managed Care, Other (non HMO) | Source: Ambulatory Visit | Attending: Oncology | Admitting: Oncology

## 2019-08-27 DIAGNOSIS — Z170 Estrogen receptor positive status [ER+]: Secondary | ICD-10-CM | POA: Diagnosis present

## 2019-08-27 DIAGNOSIS — C50411 Malignant neoplasm of upper-outer quadrant of right female breast: Secondary | ICD-10-CM | POA: Diagnosis present

## 2019-08-27 MED ORDER — TECHNETIUM TC 99M MEDRONATE IV KIT
20.0000 | PACK | Freq: Once | INTRAVENOUS | Status: AC | PRN
  Administered 2019-08-27: 09:00:00 18.8 via INTRAVENOUS

## 2019-08-30 ENCOUNTER — Encounter: Payer: Self-pay | Admitting: *Deleted

## 2019-08-31 ENCOUNTER — Encounter: Payer: Self-pay | Admitting: *Deleted

## 2019-08-31 ENCOUNTER — Inpatient Hospital Stay: Payer: Managed Care, Other (non HMO)

## 2019-08-31 ENCOUNTER — Other Ambulatory Visit: Payer: Self-pay

## 2019-08-31 ENCOUNTER — Inpatient Hospital Stay: Payer: Managed Care, Other (non HMO) | Attending: Oncology

## 2019-08-31 VITALS — BP 109/76 | HR 78 | Temp 98.7°F | Resp 18

## 2019-08-31 DIAGNOSIS — Z5111 Encounter for antineoplastic chemotherapy: Secondary | ICD-10-CM | POA: Diagnosis present

## 2019-08-31 DIAGNOSIS — Z170 Estrogen receptor positive status [ER+]: Secondary | ICD-10-CM | POA: Diagnosis not present

## 2019-08-31 DIAGNOSIS — C50411 Malignant neoplasm of upper-outer quadrant of right female breast: Secondary | ICD-10-CM

## 2019-08-31 LAB — CBC WITH DIFFERENTIAL/PLATELET
Abs Immature Granulocytes: 0.21 10*3/uL — ABNORMAL HIGH (ref 0.00–0.07)
Basophils Absolute: 0 10*3/uL (ref 0.0–0.1)
Basophils Relative: 1 %
Eosinophils Absolute: 0 10*3/uL (ref 0.0–0.5)
Eosinophils Relative: 1 %
HCT: 35.3 % — ABNORMAL LOW (ref 36.0–46.0)
Hemoglobin: 11.9 g/dL — ABNORMAL LOW (ref 12.0–15.0)
Immature Granulocytes: 7 %
Lymphocytes Relative: 43 %
Lymphs Abs: 1.3 10*3/uL (ref 0.7–4.0)
MCH: 29.2 pg (ref 26.0–34.0)
MCHC: 33.7 g/dL (ref 30.0–36.0)
MCV: 86.5 fL (ref 80.0–100.0)
Monocytes Absolute: 0.3 10*3/uL (ref 0.1–1.0)
Monocytes Relative: 9 %
Neutro Abs: 1.2 10*3/uL — ABNORMAL LOW (ref 1.7–7.7)
Neutrophils Relative %: 39 %
Platelets: 142 10*3/uL — ABNORMAL LOW (ref 150–400)
RBC: 4.08 MIL/uL (ref 3.87–5.11)
RDW: 12.8 % (ref 11.5–15.5)
WBC: 3 10*3/uL — ABNORMAL LOW (ref 4.0–10.5)
nRBC: 0 % (ref 0.0–0.2)

## 2019-08-31 LAB — COMPREHENSIVE METABOLIC PANEL
ALT: 39 U/L (ref 0–44)
AST: 16 U/L (ref 15–41)
Albumin: 3.7 g/dL (ref 3.5–5.0)
Alkaline Phosphatase: 74 U/L (ref 38–126)
Anion gap: 10 (ref 5–15)
BUN: 19 mg/dL (ref 8–23)
CO2: 24 mmol/L (ref 22–32)
Calcium: 8.8 mg/dL — ABNORMAL LOW (ref 8.9–10.3)
Chloride: 108 mmol/L (ref 98–111)
Creatinine, Ser: 0.78 mg/dL (ref 0.44–1.00)
GFR calc Af Amer: >60 mL/min (ref >60)
GFR calc non Af Amer: >60 mL/min (ref >60)
Glucose, Bld: 97 mg/dL (ref 70–99)
Potassium: 3.9 mmol/L (ref 3.5–5.1)
Sodium: 142 mmol/L (ref 135–145)
Total Bilirubin: 0.2 mg/dL — ABNORMAL LOW (ref 0.3–1.2)
Total Protein: 6.4 g/dL — ABNORMAL LOW (ref 6.5–8.1)

## 2019-08-31 MED ORDER — SODIUM CHLORIDE 0.9 % IV SOLN
600.0000 mg/m2 | Freq: Once | INTRAVENOUS | Status: AC
  Administered 2019-08-31: 11:00:00 1020 mg via INTRAVENOUS
  Filled 2019-08-31: qty 51

## 2019-08-31 MED ORDER — PALONOSETRON HCL INJECTION 0.25 MG/5ML
INTRAVENOUS | Status: AC
  Filled 2019-08-31: qty 5

## 2019-08-31 MED ORDER — DEXAMETHASONE SODIUM PHOSPHATE 10 MG/ML IJ SOLN
INTRAMUSCULAR | Status: AC
  Filled 2019-08-31: qty 1

## 2019-08-31 MED ORDER — HEPARIN SOD (PORK) LOCK FLUSH 100 UNIT/ML IV SOLN
500.0000 [IU] | Freq: Once | INTRAVENOUS | Status: AC | PRN
  Administered 2019-08-31: 12:00:00 500 [IU]
  Filled 2019-08-31: qty 5

## 2019-08-31 MED ORDER — SODIUM CHLORIDE 0.9% FLUSH
10.0000 mL | INTRAVENOUS | Status: DC | PRN
  Administered 2019-08-31: 10 mL
  Filled 2019-08-31: qty 10

## 2019-08-31 MED ORDER — DEXAMETHASONE SODIUM PHOSPHATE 10 MG/ML IJ SOLN
10.0000 mg | Freq: Once | INTRAMUSCULAR | Status: AC
  Administered 2019-08-31: 10:00:00 10 mg via INTRAVENOUS

## 2019-08-31 MED ORDER — DOXORUBICIN HCL CHEMO IV INJECTION 2 MG/ML
60.0000 mg/m2 | Freq: Once | INTRAVENOUS | Status: AC
  Administered 2019-08-31: 11:00:00 102 mg via INTRAVENOUS
  Filled 2019-08-31: qty 51

## 2019-08-31 MED ORDER — PALONOSETRON HCL INJECTION 0.25 MG/5ML
0.2500 mg | Freq: Once | INTRAVENOUS | Status: AC
  Administered 2019-08-31: 10:00:00 0.25 mg via INTRAVENOUS

## 2019-08-31 MED ORDER — SODIUM CHLORIDE 0.9 % IV SOLN
Freq: Once | INTRAVENOUS | Status: AC
  Filled 2019-08-31: qty 250

## 2019-08-31 MED ORDER — SODIUM CHLORIDE 0.9 % IV SOLN
150.0000 mg | Freq: Once | INTRAVENOUS | Status: AC
  Administered 2019-08-31: 10:00:00 150 mg via INTRAVENOUS
  Filled 2019-08-31: qty 150

## 2019-08-31 MED ORDER — SODIUM CHLORIDE 0.9% FLUSH
10.0000 mL | INTRAVENOUS | Status: DC | PRN
  Administered 2019-08-31: 12:00:00 10 mL
  Filled 2019-08-31: qty 10

## 2019-08-31 NOTE — Patient Instructions (Addendum)
[place] [facility] Discharge Instructions for Patients Receiving Chemotherapy  Today you received the following chemotherapy agents: doxorubicin and cyclophosphamide.   To help prevent nausea and vomiting after your treatment, we encourage you to take your nausea medication as directed.   If you develop nausea and vomiting that is not controlled by your nausea medication, call the clinic.   BELOW ARE SYMPTOMS THAT SHOULD BE REPORTED IMMEDIATELY:  *FEVER GREATER THAN 100.5 F  *CHILLS WITH OR WITHOUT FEVER  NAUSEA AND VOMITING THAT IS NOT CONTROLLED WITH YOUR NAUSEA MEDICATION  *UNUSUAL SHORTNESS OF BREATH  *UNUSUAL BRUISING OR BLEEDING  TENDERNESS IN MOUTH AND THROAT WITH OR WITHOUT PRESENCE OF ULCERS  *URINARY PROBLEMS  *BOWEL PROBLEMS  UNUSUAL RASH Items with * indicate a potential emergency and should be followed up as soon as possible.  Feel free to call the clinic should you have any questions or concerns. The clinic phone number is [phone].  Please show the [facility] at check-in to the Emergency Department and triage nurse.  Pegfilgrastim injection What is this medicine? PEGFILGRASTIM (PEG fil gra stim) is a long-acting granulocyte colony-stimulating factor that stimulates the growth of neutrophils, a type of white blood cell important in the body's fight against infection. It is used to reduce the incidence of fever and infection in patients with certain types of cancer who are receiving chemotherapy that affects the bone marrow, and to increase survival after being exposed to high doses of radiation. This medicine may be used for other purposes; ask your health care provider or pharmacist if you have questions. COMMON BRAND NAME(S): Steve Rattler, Ziextenzo What should I tell my health care provider before I take this medicine? They need to know if you have any of these conditions:  kidney disease  latex allergy  ongoing  radiation therapy  sickle cell disease  skin reactions to acrylic adhesives (On-Body Injector only)  an unusual or allergic reaction to pegfilgrastim, filgrastim, other medicines, foods, dyes, or preservatives  pregnant or trying to get pregnant  breast-feeding How should I use this medicine? This medicine is for injection under the skin. If you get this medicine at home, you will be taught how to prepare and give the pre-filled syringe or how to use the On-body Injector. Refer to the patient Instructions for Use for detailed instructions. Use exactly as directed. Tell your healthcare provider immediately if you suspect that the On-body Injector may not have performed as intended or if you suspect the use of the On-body Injector resulted in a missed or partial dose. It is important that you put your used needles and syringes in a special sharps container. Do not put them in a trash can. If you do not have a sharps container, call your pharmacist or healthcare provider to get one. Talk to your pediatrician regarding the use of this medicine in children. While this drug may be prescribed for selected conditions, precautions do apply. Overdosage: If you think you have taken too much of this medicine contact a poison control center or emergency room at once. NOTE: This medicine is only for you. Do not share this medicine with others. What if I miss a dose? It is important not to miss your dose. Call your doctor or health care professional if you miss your dose. If you miss a dose due to an On-body Injector failure or leakage, a new dose should be administered as soon as possible using a single prefilled syringe for manual use.  What may interact with this medicine? Interactions have not been studied. Give your health care provider a list of all the medicines, herbs, non-prescription drugs, or dietary supplements you use. Also tell them if you smoke, drink alcohol, or use illegal drugs. Some items may  interact with your medicine. This list may not describe all possible interactions. Give your health care provider a list of all the medicines, herbs, non-prescription drugs, or dietary supplements you use. Also tell them if you smoke, drink alcohol, or use illegal drugs. Some items may interact with your medicine. What should I watch for while using this medicine? You may need blood work done while you are taking this medicine. If you are going to need a MRI, CT scan, or other procedure, tell your doctor that you are using this medicine (On-Body Injector only). What side effects may I notice from receiving this medicine? Side effects that you should report to your doctor or health care professional as soon as possible:  allergic reactions like skin rash, itching or hives, swelling of the face, lips, or tongue  back pain  dizziness  fever  pain, redness, or irritation at site where injected  pinpoint red spots on the skin  red or dark-brown urine  shortness of breath or breathing problems  stomach or side pain, or pain at the shoulder  swelling  tiredness  trouble passing urine or change in the amount of urine Side effects that usually do not require medical attention (report to your doctor or health care professional if they continue or are bothersome):  bone pain  muscle pain This list may not describe all possible side effects. Call your doctor for medical advice about side effects. You may report side effects to FDA at 1-800-FDA-1088. Where should I keep my medicine? Keep out of the reach of children. If you are using this medicine at home, you will be instructed on how to store it. Throw away any unused medicine after the expiration date on the label. NOTE: This sheet is a summary. It may not cover all possible information. If you have questions about this medicine, talk to your doctor, pharmacist, or health care provider.  2020 Elsevier/Gold Standard (2017-10-20  16:57:08)  Subcutaneous Injection Instructions Using a Prefilled Syringe A subcutaneous injection is a shot of medicine that is given into the layer of fat and tissue between skin and muscle. The injection is given with a single-use syringe that is already filled with medicine (prefilled syringe). Read the medicine guide or package insert that came with the syringe. Follow directions from the guide about how to prepare and give the injection. This is important because the directions may be different for each medicine. Use only the syringe, needle, and medicine that your health care provider prescribes. Use each prefilled syringe and needle only one time. Supplies needed:  Prefilled syringe with needle. Use the needle length and size (gauge) that your health care provider or pharmacist gives to you.  Alcohol wipes.  Bandage.  A container for syringe disposal. This may be a puncture-proof sharps container or a hard-sided plastic container that has a secure lid, such as an empty laundry detergent bottle. How to choose a site for injection Follow instructions from your health care provider about where to give an injection. Do not inject in the same spot each time. There are five main areas that can be used for injecting. These areas include:  Abdomen. Avoid the area that is within 2 inches (5 cm) of your  navel (umbilicus).  Front of thigh.  Upper, outer side of thigh.  Upper, outer side of arm.  Upper, outer part of buttock. How to give an injection using a prefilled syringe  1. Wash your hands with soap and water. If soap and water are not available, use hand sanitizer. 2. Use an alcohol wipe to clean the site where you will be injecting the needle. Let the site air-dry. 3. Remove the plastic cover from the needle on the syringe. Do not let the needle touch anything. 4. Hold the syringe with the needle pointing up. Check the syringe for any remaining air bubbles. If there are air bubbles,  flick the syringe with your finger until the air bubbles rise to the top. Then, gently push on the plunger until you can see a drop of medicine appear at the tip of the needle. This will clear any remaining air bubbles from the syringe. 5. Hold the syringe in your writing hand like a pencil. 6. Use your other hand to pinch and hold about an inch (2.5 cm) of skin. Do not directly touch the cleaned part of the skin. 7. Insert the entire needle straight into the fold of skin. The needle should be at a 90-degree angle (perpendicular) to the skin. Push the needle all the way against the skin. The needle may need to be injected at a 45-degree angle in thin adults or children who have a small amount of body fat. 8. After the needle is completely inserted into the skin, release the skin that you are pinching. Continue to hold the syringe with your writing hand. 9. Use your thumb or index finger of your writing hand to push the plunger all the way into the syringe to inject the medicine. 10. Pull the needle straight out of the skin. 11. Press and hold the alcohol wipe over the injection site until bleeding stops. Do not rub the area. 12. Cover the injection site with a bandage, if needed. How to safely throw away the supplies If you are using a syringe that does not have a safety system for shielding the needle after injection:  Do not recap the needle. Place the syringe and needle in the disposal container. If your syringe has a safety system for shielding the needle after injection:  Firmly push down on the plunger after you complete the injection. The protective sleeve will automatically cover the needle, and you will hear a click. The click means that the needle is safely covered. Follow the disposal regulations for the area where you live. Do not use any syringe or needle more than one time. Contact a health care provider if:  You have difficulty giving the injection.  You think that the injection  was not given correctly.  You have difficulty with any of the supplies.  The medicine causes side effects.  Rashes develop on the skin.  A fever develops.  The condition that is being treated gets worse. Get help right away if: Any of these symptoms develop after the injection is given:  Difficulty breathing.  Chest pain.  A rash over most or all of the body.  Swelling of the lips or tongue.  Difficulty swallowing. Summary  A subcutaneous injection is a shot of medicine that is given into the layer of fat and tissue between skin and muscle.  Read the medicine guide or package insert that came with the syringe. Follow directions from the guide about how to prepare and give the injection.  Follow instructions from your health care provider about where to give an injection.  Contact a health care provider if you cannot give an injection, you think you gave it incorrectly, or you develop any side effects of the medicine.  Get help right away if any of these develop after an injection: difficulty breathing, chest pain, rash on the body, swelling of the lips or tongue, or difficulty swallowing. This information is not intended to replace advice given to you by your health care provider. Make sure you discuss any questions you have with your health care provider. Document Revised: 11/05/2018 Document Reviewed: 04/15/2018 Elsevier Patient Education  [place].

## 2019-08-31 NOTE — Progress Notes (Signed)
Per Dr Jana Hakim, ok to treat with [place] of 1.2.

## 2019-09-02 ENCOUNTER — Ambulatory Visit: Payer: Managed Care, Other (non HMO)

## 2019-09-06 NOTE — Progress Notes (Signed)
[place]  [phone] [phone]     ID: Nyree Applegate DOB: August 17, 1955  MR#: [id number]  VVO#:[id number]  Patient Care Team: [name], NP as PCP - General (Family Medicine) [name], RN as Oncology Nurse Navigator [name], RN as Oncology Nurse Navigator Louise Rawson, [name], MD as Consulting Physician (Oncology) [name], MD as Attending Physician (Radiation Oncology) [name], MD as Consulting Physician (General Surgery) [name], MD OTHER MD:  CHIEF COMPLAINT: Locally advanced estrogen receptor positive breast cancer  CURRENT TREATMENT: Neoadjuvant chemotherapy   INTERVAL HISTORY: Sumire returns today for follow up of her locally advanced breast cancer.  She started her neoadjuvant chemotherapy consisting of cyclophosphamide and doxorubicin on 08/18/2019. She was scheduled to receive Neulasta on day 3, and apparently that was approved by insurance for subsequent cycles but there was some delay because her insurance wants her to purchase her Neulasta from pharmacy and self administer it.  When the patient returned on day 3 for Neulasta  I discussed the situation with the insurance and a peer to peer and they agreed to 4 days of fill gastrim which the patient received instead of the Neulasta.    She then received her second cycle on 08/31/2019.  She obtained her own Neulasta from the pharmacy, at no out-of-pocket cost and her husband, who is a Animal nutritionist, administered it.  She took her Claritin as instructed.  She did absolutely fine with it.  We are checking a CBC (for some reason that had not been scheduled) to check her nadir counts today.  Since her last visit, she underwent bone scan on 08/27/2019, showing: single focus of increased uptake at T9; degenerative changes within bilateral knees.  Chest CT scan obtained 08/10/2019 showed no evidence of metastatic disease.   REVIEW OF SYSTEMS: Zavia  was driving sometime ago and developed a pain in her back which is not exactly a low back pain.  She says it is internal although it is in the lower back.  This went away on its own and then today she had the same problem again.  She is having it right now she says.  This is not associated with bowel movements.  Her bowel movements have been just fine.  She has had no change in her bladder habits.  There has been no rash.  She has had no mouth sores, no nausea or vomiting, and only minimal fatigue.  She has had mild headaches.  A detailed review of systems was otherwise stable.   HISTORY OF CURRENT ILLNESS: From the original intake note:  Ellan Tess has noted a difference between her right and left breast for several years.  She last had a mammogram she thinks about 7 or 8 years ago.  More recently it seemed to her that her right breast was becoming more firm.  She brought it to medical attention and underwent bilateral diagnostic mammography with tomography and right breast ultrasonography at [facility] on 07/07/2019 showing: heterogeneously dense breast tissue; right nipple retraction; architectural distortion with microcalcifications and irregular dense tissue in the upper-outer quadrant; at least 3 abnormal right axillary lymph nodes.  Accordingly on 07/13/2019 she proceeded to biopsy of the right breast area in question. The pathology from this procedure ([id number]) showed:   1. Right Breast, upper-outer quadrant, closer to the nipple  - invasive ductal carcinoma with micropapillary features, grade 2  - ductal carcinoma in situ, grade 2-3, solid and cribriform pattern with comedonecrosis  -  Prognostic indicators significant for: estrogen receptor, 100% positive with strong staining intensity and progesterone receptor, 0% negative. HER2 equivocal by immunohistochemistry (2+), but negative by fluorescent in situ hybridization with a signals ratio 1.5 and number per cell 3.8.  2. Right  Breast, upper-outer quadrant, axillary tail  - invasive ductal carcinoma, grade 2  - ductal carcinoma in situ, grade 2-3  - Prognostic indicators significant for: estrogen receptor, 100% positive and progesterone receptor, 50% positive, both with strong staining intensity. HER2 equivocal by immunohistochemistry (2+), but negative by fluorescent in situ hybridization with a signals ratio 1.9 and number per cell 5.3.  The patient's subsequent history is as detailed below.   PAST MEDICAL HISTORY: Past Medical History:  Diagnosis Date  . Anemia   . right breast ca dx'd 06/2019  . Varicose vein   . Varicose veins of left lower extremity    "bluging ones at top- no pain"    PAST SURGICAL HISTORY: Past Surgical History:  Procedure Laterality Date  . NO PAST SURGERIES    . none    . PORTACATH PLACEMENT Right 08/17/2019   Procedure: INSERTION PORT-A-CATH WITH ULTRASOUND;  Surgeon: [name], MD;  Location: [facility] [place];  Service: General;  Laterality: Right;  . wisdom teeth removal     remotely.     FAMILY HISTORY: Family History  Problem Relation Age of Onset  . Diabetes Father   . Blindness Mother   . Congestive Heart Failure Sister   . Gallbladder disease Sister        removed  . Diabetes Sister   . Colitis Sister    The patient's father died at age 79 from a stroke.  The patient's mother died from multiple medical problems not related to cancer at age 29.  The patient had 4 brothers, 7 sisters.  There is no breast ovarian or prostate cancer in the family to her knowledge.  She had one cousin with pancreatic cancer.  GYNECOLOGIC HISTORY:  No LMP recorded. Patient is postmenopausal. Menarche: 64 years old Age at first live birth: 64 years old [place] P 5 LMP 36 Contraceptive: Brief use of oral contraceptives as a teenager HRT no Hysterectomy?  No BSO?    SOCIAL HISTORY: (updated 07/2019)  Madiline works in a Engineer, agricultural, and Education administrator houses.  Her husband of 77 years  [name] is a Chief Operating Officer.  Their children are [name], 42, who lives in [place] and is a Veterinary surgeon in the [employer]; [name] 33 who works as a Designer, jewellery at [employer]; [name] who lives in [place], and is not currently employed; Nelsonia lives in [place], and [name] who is an Armed forces training and education officer in [place].  The patient has 4 grandchildren all under the age of 44    ADVANCED DIRECTIVES: Husband [name] is her HCPOA.   HEALTH MAINTENANCE: Social History   Tobacco Use  . Smoking status: Never Smoker  . Smokeless tobacco: Never Used  Substance Use Topics  . Alcohol use: Yes    Comment: rarely  . Drug use: No     Colonoscopy: never done  PAP: Remote  Bone density: Never   No Known Allergies  Current Outpatient Medications  Medication Sig Dispense Refill  . Calcium Citrate-Vitamin D (CALCIUM CITRATE + D PO) Take 1 tablet by mouth 2 (two) times daily.     Marland Kitchen dexamethasone (DECADRON) 4 MG tablet Take 2 tablets by mouth once a day on the day after chemotherapy and then take 2 tablets two times  a day for 2 days. Take with food. 30 tablet 1  . ibuprofen (ADVIL) 800 MG tablet Take 1 tablet (800 mg total) by mouth every 8 (eight) hours as needed. 30 tablet 0  . lidocaine-prilocaine (EMLA) cream Apply to affected area once 30 g 3  . loratadine (CLARITIN) 10 MG tablet Take 1 tablet (10 mg total) by mouth daily. (Patient taking differently: Take 10 mg by mouth daily. Take day 2,  3 , 4------------chemo) 60 tablet 1  . LORazepam (ATIVAN) 0.5 MG tablet Take 1 tablet (0.5 mg total) by mouth at bedtime as needed (Nausea or vomiting). 30 tablet 0  . pegfilgrastim (NEULASTA) 6 MG/0.6ML injection Inject 0.6 mLs (6 mg total) into the skin every 14 (fourteen) days. 1 Syringe 2  . prochlorperazine (COMPAZINE) 10 MG tablet Take 1 tablet (10 mg total) by mouth every 6 (six) hours as needed (Nausea or vomiting). 30 tablet 1   No current facility-administered  medications for this visit.    OBJECTIVE: Middle-aged white woman who appears well  Vitals:   09/07/19 1502  BP: 126/68  Pulse: 92  Resp: 18  Temp: 98.5 F (36.9 C)  SpO2: 99%     Body mass index is 23.24 kg/m.   Wt Readings from Last 3 Encounters:  09/07/19 137 lb 8 oz (62.4 kg)  08/25/19 137 lb 11.2 oz (62.5 kg)  08/18/19 142 lb 9 oz (64.7 kg)      ECOG FS:1 - Symptomatic but completely ambulatory  Wearing a very appropriate way Sclerae unicteric, EOMs intact Wearing a mask No cervical or supraclavicular adenopathy Lungs no rales or rhonchi Heart regular rate and rhythm Abd soft, nontender, positive bowel sounds MSK no focal spinal tenderness, no no tenderness to vigorous palpation of the lower spine and hips, or palpation of the flanks bilaterally Neuro: nonfocal, well oriented, appropriate affect Breasts: The area of concern in the right breast appears a bit softer.  There is no erythema.  Left breast is benign.  Both axillae are benign.   Right breast 08/13/2018     LAB RESULTS:  CMP     Component Value Date/Time   NA 142 08/31/2019 0842   K 3.9 08/31/2019 0842   CL 108 08/31/2019 0842   CO2 24 08/31/2019 0842   GLUCOSE 97 08/31/2019 0842   BUN 19 08/31/2019 0842   CREATININE 0.78 08/31/2019 0842   CREATININE 0.79 08/04/2019 1546   CREATININE 0.76 03/27/2015 1146   CALCIUM 8.8 (L) 08/31/2019 0842   PROT 6.4 (L) 08/31/2019 0842   ALBUMIN 3.7 08/31/2019 0842   AST 16 08/31/2019 0842   AST 17 08/04/2019 1546   ALT 39 08/31/2019 0842   ALT 21 08/04/2019 1546   ALKPHOS 74 08/31/2019 0842   BILITOT 0.2 (L) 08/31/2019 0842   BILITOT 0.2 (L) 08/04/2019 1546   GFRNONAA >60 08/31/2019 0842   GFRNONAA >60 08/04/2019 1546   GFRNONAA 87 03/27/2015 1146   GFRAA >60 08/31/2019 0842   GFRAA >60 08/04/2019 1546   GFRAA >89 03/27/2015 1146    No results found for: TOTALPROTELP, ALBUMINELP, A1GS, A2GS, BETS, BETA2SER, GAMS, MSPIKE, SPEI  Lab Results    Component Value Date   WBC 3.0 (L) 08/31/2019   NEUTROABS 1.2 (L) 08/31/2019   HGB 11.9 (L) 08/31/2019   HCT 35.3 (L) 08/31/2019   MCV 86.5 08/31/2019   PLT 142 (L) 08/31/2019    No results found for: LABCA2  No components found for: MWNUUV253  No results for input(s):  INR in the last 168 hours.  No results found for: LABCA2  No results found for: CAN199  No results found for: SVX793  No results found for: JQZ009  Lab Results  Component Value Date   CA2729 27.4 08/18/2019    No components found for: HGQUANT  No results found for: CEA1 / No results found for: CEA1   No results found for: AFPTUMOR  No results found for: CHROMOGRNA  No results found for: KPAFRELGTCHN, LAMBDASER, KAPLAMBRATIO (kappa/lambda light chains)  No results found for: HGBA, HGBA2QUANT, HGBFQUANT, HGBSQUAN (Hemoglobinopathy evaluation)   No results found for: LDH  Lab Results  Component Value Date   IRON 94 02/15/2015   TIBC 272 02/15/2015   IRONPCTSAT 35 02/15/2015   (Iron and TIBC)  Lab Results  Component Value Date   FERRITIN 70 02/15/2015    Urinalysis No results found for: COLORURINE, APPEARANCEUR, LABSPEC, PHURINE, GLUCOSEU, HGBUR, BILIRUBINUR, KETONESUR, PROTEINUR, UROBILINOGEN, NITRITE, LEUKOCYTESUR   STUDIES: CT Chest W Contrast  Result Date: 08/10/2019 CLINICAL DATA:  RT breast cancer diagnosed 12/20, surgery, chemo, XRT and oral chemo has all been planned, but not started yet. EXAM: CT CHEST WITH CONTRAST TECHNIQUE: Multidetector CT imaging of the chest was performed during intravenous contrast administration. CONTRAST:  84m OMNIPAQUE IOHEXOL 300 MG/ML  SOLN COMPARISON:  None. FINDINGS: Cardiovascular: No significant vascular findings. Normal heart size. No pericardial effusion. Mediastinum/Nodes: Mass within the posterior aspect of the RIGHT breast measures 2.6 by 1.7 cm. Adjacent high-density RIGHT axial lymph node measures 7 mm short axis (image 23/2). No internal  mammary adenopathy. Supraclavicular or mediastinal adenopathy. Lungs/Pleura: Small 2 mm subpleural nodule in the LEFT upper lobe (image 54/4). Airways normal. Upper Abdomen: Limited view of the liver, kidneys, pancreas are unremarkable. Normal adrenal glands. Musculoskeletal: No aggressive osseous lesion. IMPRESSION: 1. Mass in the posterior aspect of the RIGHT breast consistent with primary breast carcinoma. 2. Adjacent RIGHT axial lymph node is concerning for nodal metastasis. 3. No central lymphadenopathy. 4. Small 2 mm subpleural nodule in the LEFT upper lobe is favored benign. Electronically Signed   By: SSuzy BouchardM.D.   On: 08/10/2019 09:21   NM Bone Scan Whole Body  Result Date: 08/27/2019 CLINICAL DATA:  Recent breast cancer diagnosis. EXAM: NUCLEAR MEDICINE WHOLE BODY BONE SCAN TECHNIQUE: Whole body anterior and posterior images were obtained approximately 3 hours after intravenous injection of radiopharmaceutical. RADIOPHARMACEUTICALS:  18.8 mCi Technetium-985mDP IV COMPARISON:  None. FINDINGS: Mild diffusely increased tracer activity seen within the bilateral knees, likely degenerative in origin. A small focus of increased tracer activity is seen at the approximate level of the T9 vertebral body on the right. Physiologic tracer activity is seen throughout the remainder of the osseous skeleton. Physiologic tracer uptake is also seen within both kidneys. IMPRESSION: 1. Single focus of increased tracer uptake at the approximate level of the T9 vertebral body. A single metastatic focus cannot be excluded. 2. Degenerative changes within the bilateral knees. Electronically Signed   By: ThVirgina Norfolk.D.   On: 08/27/2019 16:26   MR BREAST BILATERAL W WO CONTRAST INC CAD  Addendum Date: 08/18/2019   ADDENDUM REPORT: 08/18/2019 07:49 ADDENDUM: This is an addendum to the report originally dictated on 08/16/2019. All additional positive findings are within the RIGHT breast. Additional biopsy  recommendations are also for the RIGHT breast. There is no MRI evidence for malignancy on the left. The IMPRESSION and RECOMMENDATION should read as follows. IMPRESSION: 1. Extensive enhancement involving the entire upper outer  and portions of the upper inner right breast at the site of patient's known malignancy. This spans approximately 7.6 x 3.9 x 5.9 cm (transverse by AP by craniocaudal dimensions). 2. Two additional, suspicious 5-6 mm enhancing masses in the inferior RIGHT breast. MRI guided biopsy can be performed if this will alter clinical management. 3. Five suspicious level 1 right axillary lymph nodes. 4. No MRI evidence of malignancy on the left. RECOMMENDATION: MRI guided biopsy of two additional RIGHT breast masses can be performed if this will alter clinical/surgical management. BI-RADS CATEGORY  4: Suspicious. Electronically Signed   By: Kristopher Oppenheim M.D.   On: 08/18/2019 07:49   Result Date: 08/18/2019 CLINICAL DATA:  64 year old female with recently diagnosed right breast cancer presents for staging prior to neoadjuvant chemotherapy. LABS:  None performed on site. EXAM: BILATERAL BREAST MRI WITH AND WITHOUT CONTRAST TECHNIQUE: Multiplanar, multisequence MR images of both breasts were obtained prior to and following the intravenous administration of 6 ml of Gadavist. Three-dimensional MR images were rendered by post-processing of the original MR data on an independent workstation. The three-dimensional MR images were interpreted, and findings are reported in the following complete MRI report for this study. Three dimensional images were evaluated at the independent DynaCad workstation COMPARISON:  Previous exam(s). FINDINGS: Breast composition: c. Heterogeneous fibroglandular tissue. Background parenchymal enhancement: Mild. Right breast: Extensive, contiguous enhancement is seen involving the entire upper outer quadrant and portions of the upper inner quadrant. This spans approximately 7.6 x  3.9 x 5.9 cm (transverse by AP by craniocaudal dimensions). Two additional 5-6 mm enhancing masses are seen in the central inferior left breast at anterior and middle depth (series 6, image 77 and 80 3/144). Left breast: No mass or abnormal enhancement. Lymph nodes: Five suspicious level 1 right axillary lymph nodes are noted, with diffuse cortical thickening and hilar effacement. There are no suspicious right axillary or internal mammary chain lymph nodes. Ancillary findings:  None. IMPRESSION: 1. Extensive enhancement involving the entire upper outer and portions of the upper inner right breast at the site of patient's known malignancy. This spans approximately 7.6 x 3.9 x 5.9 cm (transverse by AP by craniocaudal dimensions). 2. Two additional, suspicious 5-6 mm enhancing masses in the inferior left breast. MRI guided biopsy can be performed if this will alter clinical management. 3. Five suspicious level 1 right axillary lymph nodes. 4. No MRI evidence of malignancy on the left. RECOMMENDATION: MRI guided biopsy of two additional left breast masses can be performed if this will alter clinical/surgical management. BI-RADS CATEGORY  4: Suspicious. Electronically Signed: By: Kristopher Oppenheim M.D. On: 08/16/2019 12:23   DG Chest Port 1 View  Result Date: 08/17/2019 CLINICAL DATA:  Check Port-A-Cath placement EXAM: PORTABLE CHEST 1 VIEW COMPARISON:  None. FINDINGS: Cardiac shadows within normal limits. Right chest wall port is now seen with the catheter tip at the cavoatrial junction. No pneumothorax is noted. Lungs are clear. No bony abnormality is noted. IMPRESSION: Right chest wall port in satisfactory position. No pneumothorax is noted. Electronically Signed   By: Inez Catalina M.D.   On: 08/17/2019 09:42   DG Fluoro Guide CV Line-No Report  Result Date: 08/17/2019 Fluoroscopy was utilized by the requesting physician.  No radiographic interpretation.   ECHOCARDIOGRAM COMPLETE  Result Date: 08/10/2019    ECHOCARDIOGRAM REPORT   Patient Name:   JAYDALYN DEMATTIA Date of Exam: 08/10/2019 Medical Rec #:  [id number]         Height:  64.5 in Accession #:    [id number]        Weight:       141.0 lb Date of Birth:  March 25, 1956         BSA:          1.70 m Patient Age:    79 years          BP:           124/79 mmHg Patient Gender: F                 HR:           60 bpm. Exam Location:  Outpatient Procedure: 2D Echo, Cardiac Doppler, Color Doppler and Strain Analysis Indications:    Z51.11 Encounter for antineoplastic chemotheraphy  History:        Patient has no prior history of Echocardiogram examinations.                 Breast Cancer.  Sonographer:    Jonelle Sidle Dance Referring Phys: Pablo Pena  1. Left ventricular ejection fraction, by visual estimation, is 60 to 65%. The left ventricle has normal function. There is no left ventricular hypertrophy.  2. The left ventricle has no regional wall motion abnormalities.  3. The average left ventricular global longitudinal strain is -16.9 %.  4. Global right ventricle has normal systolic function.The right ventricular size is normal. No increase in right ventricular wall thickness.  5. Left atrial size was normal.  6. Right atrial size was normal.  7. The tricuspid valve is grossly normal.  8. The aortic valve is tricuspid. Aortic valve regurgitation is not visualized.  9. The pulmonic valve was grossly normal. Pulmonic valve regurgitation is not visualized. 10. Normal pulmonary artery systolic pressure. 11. The inferior vena cava is normal in size with greater than 50% respiratory variability, suggesting right atrial pressure of 3 mmHg. 12. The mitral valve is grossly normal. No evidence of mitral valve regurgitation. FINDINGS  Left Ventricle: Left ventricular ejection fraction, by visual estimation, is 60 to 65%. The left ventricle has normal function. The average left ventricular global longitudinal strain is -16.9 %. The left ventricle has no regional  wall motion abnormalities. There is no left ventricular hypertrophy. Left ventricular diastolic parameters were normal. Normal left atrial pressure. Right Ventricle: The right ventricular size is normal. No increase in right ventricular wall thickness. Global RV systolic function is has normal systolic function. The tricuspid regurgitant velocity is 2.03 m/s, and with an assumed right atrial pressure  of 3 mmHg, the estimated right ventricular systolic pressure is normal at 19.5 mmHg. Left Atrium: Left atrial size was normal in size. Right Atrium: Right atrial size was normal in size Pericardium: There is no evidence of pericardial effusion. Mitral Valve: The mitral valve is grossly normal. No evidence of mitral valve regurgitation. Tricuspid Valve: The tricuspid valve is grossly normal. Tricuspid valve regurgitation is trivial. Aortic Valve: The aortic valve is tricuspid. Aortic valve regurgitation is not visualized. Pulmonic Valve: The pulmonic valve was grossly normal. Pulmonic valve regurgitation is not visualized. Pulmonic regurgitation is not visualized. Aorta: The aortic root and ascending aorta are structurally normal, with no evidence of dilitation. Venous: The inferior vena cava is normal in size with greater than 50% respiratory variability, suggesting right atrial pressure of 3 mmHg. IAS/Shunts: No atrial level shunt detected by color flow Doppler.  LEFT VENTRICLE PLAX 2D LVIDd:         4.30 cm  Diastology LVIDs:         2.80 cm  LV e' lateral:   10.75 cm/s LV PW:         0.80 cm  LV E/e' lateral: 6.3 LV IVS:        0.70 cm  LV e' medial:    8.16 cm/s LVOT diam:     1.80 cm  LV E/e' medial:  8.2 LV SV:         54 ml LV SV Index:   31.23    2D Longitudinal Strain LVOT Area:     2.54 cm 2D Strain GLS Avg:     -16.9 %  RIGHT VENTRICLE             IVC RV Basal diam:  2.50 cm     IVC diam: 1.30 cm RV S prime:     11.70 cm/s TAPSE (M-mode): 2.4 cm LEFT ATRIUM             Index       RIGHT ATRIUM            Index LA diam:        3.00 cm 1.77 cm/m  RA Area:     14.20 cm LA Vol (A2C):   57.9 ml 34.15 ml/m RA Volume:   31.80 ml  18.76 ml/m LA Vol (A4C):   26.3 ml 15.51 ml/m LA Biplane Vol: 40.0 ml 23.59 ml/m  AORTIC VALVE LVOT Vmax:   80.90 cm/s LVOT Vmean:  50.700 cm/s LVOT VTI:    0.172 m  AORTA Ao Root diam: 3.10 cm Ao Asc diam:  2.70 cm MITRAL VALVE                        TRICUSPID VALVE MV Area (PHT): 4.21 cm             TR Peak grad:   16.5 mmHg MV PHT:        52.20 msec           TR Vmax:        203.00 cm/s MV Decel Time: 180 msec MV E velocity: 67.30 cm/s 103 cm/s  SHUNTS MV A velocity: 52.70 cm/s 70.3 cm/s Systemic VTI:  0.17 m MV E/A ratio:  1.28       1.5       Systemic Diam: 1.80 cm  Lyman Bishop MD Electronically signed by Lyman Bishop MD Signature Date/Time: 08/10/2019/1:38:37 PM    Final     ELIGIBLE FOR AVAILABLE RESEARCH PROTOCOL:   ASSESSMENT: 64 y.o. [place], [place] woman status post right breast biopsy x2 on 07/13/2019 for a clinical T3-T4 N1-2, stage III invasive ductal carcinoma, grade 2, estrogen receptor positive, progesterone receptor variable, HER-2 not amplified.  (a) breast MRI 08/15/2019 showed a 7.6 area of enhancement, and 5 suspicious right axillary lymph nodes as well as two 0.5 mm additional masses in the inferior right breast   (1) neoadjuvant chemotherapy consisting of cyclophosphamide and doxorubicin in dose dense fashion x4 starting 08/18/2019, to be followed by weekly paclitaxel x12  (2) definitive surgery (right modified radical mastectomy) to follow  (3) adjuvant radiation as appropriate  (4) antiestrogens to start at the completion of local treatment   PLAN: Decie did much better with her second cycle of chemotherapy.  She had less nausea and less fatigue.  I am not going to make any changes with the subsequent cycles.    I was quite concerned  about the patient having to obtain their own Neulasta since this can be very expensive but she tells me there  was no out-of-pocket cost.  Her husband did a great job at administering it it and she had no unusual side effects from it.  I do not have a simple explanation for the strange internal low back pain that she describes.  In general pains that come and go and do not stay and get worse or not cancer pain.  We will continue to monitor this.  We discussed the T9 issue in detail.  This was seen on the bone scan but not noted on the CT of the chest, which I reviewed.  Of course there was a small lesion and I may have missed it on that review.  I think the safest thing to do is to obtain an MRI of the thoracic spine.  If there is anything there we can consider biopsy and if the biopsy is positive then she can have radiation to that area at the same time as she has radiation to the chest wall  She will return to see me in a week.  That will be for cycle 3.  She knows to call for any other issues that may develop before then.  Total encounter time 35 minutes.[name], MD   09/07/2019 3:33 PM Medical Oncology and Hematology [facility] [place], [place] [address] Tel. [phone]    Fax. [phone]   This document serves as a record of services personally performed by Lurline Del, MD. It was created on his behalf by Wilburn Mylar, a trained medical scribe. The creation of this record is based on the scribe's personal observations and the provider's statements to them.   I, Lurline Del MD, have reviewed the above documentation for accuracy and completeness, and I agree with the above.   *Total Encounter Time as defined by the Centers for Medicare and Medicaid Services includes, in addition to the face-to-face time of a patient visit (documented in the note above) non-face-to-face time: obtaining and reviewing outside history, ordering and reviewing medications, tests or procedures, care coordination (communications with other health care  professionals or caregivers) and documentation in the medical record.

## 2019-09-07 ENCOUNTER — Inpatient Hospital Stay: Payer: Managed Care, Other (non HMO)

## 2019-09-07 ENCOUNTER — Inpatient Hospital Stay: Payer: Managed Care, Other (non HMO) | Admitting: Oncology

## 2019-09-07 ENCOUNTER — Other Ambulatory Visit: Payer: Self-pay

## 2019-09-07 VITALS — BP 126/68 | HR 92 | Temp 98.5°F | Resp 18 | Ht 64.5 in | Wt 137.5 lb

## 2019-09-07 DIAGNOSIS — C50411 Malignant neoplasm of upper-outer quadrant of right female breast: Secondary | ICD-10-CM

## 2019-09-07 DIAGNOSIS — Z170 Estrogen receptor positive status [ER+]: Secondary | ICD-10-CM | POA: Diagnosis not present

## 2019-09-07 DIAGNOSIS — Z5111 Encounter for antineoplastic chemotherapy: Secondary | ICD-10-CM | POA: Diagnosis not present

## 2019-09-07 LAB — CBC WITH DIFFERENTIAL/PLATELET
Abs Immature Granulocytes: 0.1 10*3/uL — ABNORMAL HIGH (ref 0.00–0.07)
Basophils Absolute: 0.1 10*3/uL (ref 0.0–0.1)
Basophils Relative: 3 %
Eosinophils Absolute: 0 10*3/uL (ref 0.0–0.5)
Eosinophils Relative: 0 %
HCT: 34.8 % — ABNORMAL LOW (ref 36.0–46.0)
Hemoglobin: 11.7 g/dL — ABNORMAL LOW (ref 12.0–15.0)
Lymphocytes Relative: 41 %
Lymphs Abs: 1.4 10*3/uL (ref 0.7–4.0)
MCH: 28.7 pg (ref 26.0–34.0)
MCHC: 33.6 g/dL (ref 30.0–36.0)
MCV: 85.5 fL (ref 80.0–100.0)
Metamyelocytes Relative: 3 %
Monocytes Absolute: 0.5 10*3/uL (ref 0.1–1.0)
Monocytes Relative: 14 %
Myelocytes: 1 %
Neutro Abs: 1.3 10*3/uL — ABNORMAL LOW (ref 1.7–7.7)
Neutrophils Relative %: 38 %
Platelets: 170 10*3/uL (ref 150–400)
RBC: 4.07 MIL/uL (ref 3.87–5.11)
RDW: 12.8 % (ref 11.5–15.5)
WBC: 3.3 10*3/uL — ABNORMAL LOW (ref 4.0–10.5)
nRBC: 0 % (ref 0.0–0.2)

## 2019-09-08 ENCOUNTER — Encounter: Payer: Self-pay | Admitting: *Deleted

## 2019-09-08 ENCOUNTER — Telehealth: Payer: Self-pay | Admitting: *Deleted

## 2019-09-08 NOTE — Telephone Encounter (Signed)
Pt left VM stating pain discussed at visit yesterday " has gotten more noticeable after I left "  She states she is managing the pain with tylenol - " but if I do not take it is very painful "  She states pain is present despite position.  " wanted to let Dr Jana Hakim know since we will be doing the MRI of the area on the thoracic spine due to the abnormal reading on the bone scan."  Return call number given as [phone].  This RN returned call and obtained pts VM- message left call was being returned and requested her to call again.  Of note pt received neulasta at home ( per her insurance ) post chemo last week.

## 2019-09-09 ENCOUNTER — Telehealth: Payer: Self-pay | Admitting: *Deleted

## 2019-09-09 NOTE — Telephone Encounter (Signed)
Treatment plan and records request received from [facility] Case Manager Vickie Epley "Can be reached to assist with arranging any services patient may need in future for nursing, inpatient admissions for chemotherapy. Clinical trials, hospice, infusions, DME, oxygen etc.  Ph:[phone] ext.[phone].  Fax. [phone]."

## 2019-09-10 NOTE — Telephone Encounter (Signed)
This RN returned call to pt per her VM-  She states she had great relief of back pain with use of claritin - as well as appreciation of discussion of low back pain likely secondary to the neulasta injection.  She states her back pain has now resolved " but then I developed itching on my affected breast and then the other one- as well as my head "  She stopped the claritin due to the above- with resolution of itching.  Back pain has not returned.  Paige Gilbert wanted Dr Jannifer Rodney to know the above- and she denies any current needs at this time.  This RN did ask pt to keep a diary of daily occurrences - if more concerning to call with issues otherwise it is a good way to recall to the provider at her visit how she is tolerating the treatment.  Paige Gilbert verbalized understanding.

## 2019-09-13 ENCOUNTER — Other Ambulatory Visit: Payer: Self-pay

## 2019-09-13 ENCOUNTER — Encounter: Payer: Self-pay | Admitting: *Deleted

## 2019-09-13 ENCOUNTER — Ambulatory Visit (HOSPITAL_COMMUNITY)
Admission: RE | Admit: 2019-09-13 | Discharge: 2019-09-13 | Disposition: A | Discharge status: Home / Self Care | Payer: Managed Care, Other (non HMO) | Source: Ambulatory Visit | Attending: Oncology | Admitting: Oncology

## 2019-09-13 DIAGNOSIS — C50411 Malignant neoplasm of upper-outer quadrant of right female breast: Secondary | ICD-10-CM | POA: Diagnosis not present

## 2019-09-13 DIAGNOSIS — Z170 Estrogen receptor positive status [ER+]: Secondary | ICD-10-CM | POA: Diagnosis present

## 2019-09-13 MED ORDER — GADOBUTROL 1 MMOL/ML IV SOLN
6.0000 mL | Freq: Once | INTRAVENOUS | Status: AC | PRN
  Administered 2019-09-13: 6 mL via INTRAVENOUS

## 2019-09-13 NOTE — Progress Notes (Signed)
[facility]  [phone] [phone]     ID: Paige Gilbert DOB: Jan 30, 1956  MR#: [id number]  YQM#:[id number]  Patient Care Team: Paige Booze, NP as PCP - General (Family Medicine) Paige Kaufmann, RN as Oncology Nurse Navigator Paige Germany, RN as Oncology Nurse Navigator Paige Gilbert, Paige Dad, MD as Consulting Physician (Oncology) Paige Gibson, MD as Attending Physician (Radiation Oncology) Paige Luna, MD as Consulting Physician (General Surgery) Paige Cruel, MD OTHER MD:  CHIEF COMPLAINT: Locally advanced estrogen receptor positive breast cancer  CURRENT TREATMENT: Neoadjuvant chemotherapy   INTERVAL HISTORY: Paige Gilbert returns today for follow up and treatment of her locally advanced breast cancer.  She continues on neoadjuvant chemotherapy consisting of cyclophosphamide and doxorubicin. She obtains her own Neulasta from the pharmacy, at no out-of-pocket cost..  Her husband administers.  He is of course a Secondary school teacher.    Since her last visit, she underwent thoracic spine MRI 09/13/2019, to evaluate the single area of uptake on her bone scan.  This showed on MRI to be benign, only degenerative changes.   REVIEW OF SYSTEMS: Paige Gilbert had significant bone pain again after the second dose of Neulasta.  This is unusual as usually the reaction is less with subsequent doses.  She did not take quite as much Claritin as she was supposed to on perhaps that part of the issue.  She did say that the Claritin caused her some dry skin and itching on her breast and so she really does not want to continue to take it more than 3 days.  She took Tylenol for pain.  Once she got the extra Claritin on board the pain subsided quickly.  She has had no nausea vomiting mouth sores or other side effects.  Detailed review of systems today was stable except as noted   HISTORY OF CURRENT ILLNESS: From the original intake note:  Paige Gilbert  has noted a difference between her right and left breast for several years.  She last had a mammogram she thinks about 7 or 8 years ago.  More recently it seemed to her that her right breast was becoming more firm.  She brought it to medical attention and underwent bilateral diagnostic mammography with tomography and right breast ultrasonography at [facility] on 07/07/2019 showing: heterogeneously dense breast tissue; right nipple retraction; architectural distortion with microcalcifications and irregular dense tissue in the upper-outer quadrant; at least 3 abnormal right axillary lymph nodes.  Accordingly on 07/13/2019 she proceeded to biopsy of the right breast area in question. The pathology from this procedure ([id number]) showed:   1. Right Breast, upper-outer quadrant, closer to the nipple  - invasive ductal carcinoma with micropapillary features, grade 2  - ductal carcinoma in situ, grade 2-3, solid and cribriform pattern with comedonecrosis  - Prognostic indicators significant for: estrogen receptor, 100% positive with strong staining intensity and progesterone receptor, 0% negative. HER2 equivocal by immunohistochemistry (2+), but negative by fluorescent in situ hybridization with a signals ratio 1.5 and number per cell 3.8.  2. Right Breast, upper-outer quadrant, axillary tail  - invasive ductal carcinoma, grade 2  - ductal carcinoma in situ, grade 2-3  - Prognostic indicators significant for: estrogen receptor, 100% positive and progesterone receptor, 50% positive, both with strong staining intensity. HER2 equivocal by immunohistochemistry (2+), but negative by fluorescent in situ hybridization with a signals ratio 1.9 and number per cell 5.3.  The patient's subsequent history is as detailed below.   PAST MEDICAL HISTORY: Past Medical  History:  Diagnosis Date  . Anemia   . right breast ca dx'd 06/2019  . Varicose vein   . Varicose veins of left lower extremity    "bluging ones at  top- no pain"    PAST SURGICAL HISTORY: Past Surgical History:  Procedure Laterality Date  . NO PAST SURGERIES    . none    . PORTACATH PLACEMENT Right 08/17/2019   Procedure: INSERTION PORT-A-CATH WITH ULTRASOUND;  Surgeon: Paige Luna, MD;  Location: [facility];  Service: General;  Laterality: Right;  . wisdom teeth removal     remotely.     FAMILY HISTORY: Family History  Problem Relation Age of Onset  . Diabetes Father   . Blindness Mother   . Congestive Heart Failure Sister   . Gallbladder disease Sister        removed  . Diabetes Sister   . Colitis Sister    The patient's father died at age 41 from a stroke.  The patient's mother died from multiple medical problems not related to cancer at age 9.  The patient had 4 brothers, 7 sisters.  There is no breast ovarian or prostate cancer in the family to her knowledge.  She had one cousin with pancreatic cancer.  GYNECOLOGIC HISTORY:  No LMP recorded. Patient is postmenopausal. Menarche: 64 years old Age at first live birth: 64 years old [place] P 5 LMP 99 Contraceptive: Brief use of oral contraceptives as a teenager HRT no Hysterectomy?  No BSO?    SOCIAL HISTORY: (updated 07/2019)  Paige Gilbert works in a Engineer, agricultural, and Education administrator houses.  Her husband of 46 years Paige Gilbert is a Chief Operating Officer.  Their children are Paige Gilbert, 14, who lives in [place] and is a Veterinary surgeon in the [employer]; Paige Gilbert 33 who works as a Designer, jewellery at [employer]; Paige Gilbert who lives in [place], and is not currently employed; Paige Gilbert lives in [place], and Paige Gilbert who is an Armed forces training and education officer in [place].  The patient has 4 grandchildren all under the age of 25    ADVANCED DIRECTIVES: Husband Paige Gilbert is her HCPOA.   HEALTH MAINTENANCE: Social History   Tobacco Use  . Smoking status: Never Smoker  . Smokeless tobacco: Never Used  Substance Use Topics  . Alcohol use: Yes    Comment: rarely  . Drug use: No      Colonoscopy: never done  PAP: Remote  Bone density: Never   No Known Allergies  Current Outpatient Medications  Medication Sig Dispense Refill  . Calcium Citrate-Vitamin D (CALCIUM CITRATE + D PO) Take 1 tablet by mouth 2 (two) times daily.     Marland Kitchen dexamethasone (DECADRON) 4 MG tablet Take 2 tablets by mouth once a day on the day after chemotherapy and then take 2 tablets two times a day for 2 days. Take with food. 30 tablet 1  . ibuprofen (ADVIL) 800 MG tablet Take 1 tablet (800 mg total) by mouth every 8 (eight) hours as needed. 30 tablet 0  . lidocaine-prilocaine (EMLA) cream Apply to affected area once 30 g 3  . loratadine (CLARITIN) 10 MG tablet Take 1 tablet (10 mg total) by mouth daily. (Patient taking differently: Take 10 mg by mouth daily. Take day 2,  3 , 4------------chemo) 60 tablet 1  . LORazepam (ATIVAN) 0.5 MG tablet Take 1 tablet (0.5 mg total) by mouth at bedtime as needed (Nausea or vomiting). 30 tablet 0  . pegfilgrastim (NEULASTA) 6 MG/0.6ML injection Inject 0.6 mLs (6 mg  total) into the skin every 14 (fourteen) days. 1 Syringe 2  . prochlorperazine (COMPAZINE) 10 MG tablet Take 1 tablet (10 mg total) by mouth every 6 (six) hours as needed (Nausea or vomiting). 30 tablet 1   No current facility-administered medications for this visit.    OBJECTIVE: Middle-aged white woman in no acute distress  Vitals:   09/14/19 0920  BP: (!) 131/55  Pulse: 73  Resp: 18  Temp: 97.8 F (36.6 C)  SpO2: 100%     Body mass index is 23.39 kg/m.   Wt Readings from Last 3 Encounters:  09/14/19 138 lb 6.4 oz (62.8 kg)  09/07/19 137 lb 8 oz (62.4 kg)  08/25/19 137 lb 11.2 oz (62.5 kg)      ECOG FS:1 - Symptomatic but completely ambulatory  Sclerae unicteric, EOMs intact Wearing a mask No cervical or supraclavicular adenopathy Lungs no rales or rhonchi Heart regular rate and rhythm Abd soft, nontender, positive bowel sounds MSK no focal spinal tenderness, no upper  extremity lymphedema Neuro: nonfocal, well oriented, appropriate affect   Right breast 08/13/2018     LAB RESULTS:  CMP     Component Value Date/Time   NA 142 08/31/2019 0842   K 3.9 08/31/2019 0842   CL 108 08/31/2019 0842   CO2 24 08/31/2019 0842   GLUCOSE 97 08/31/2019 0842   BUN 19 08/31/2019 0842   CREATININE 0.78 08/31/2019 0842   CREATININE 0.79 08/04/2019 1546   CREATININE 0.76 03/27/2015 1146   CALCIUM 8.8 (L) 08/31/2019 0842   PROT 6.4 (L) 08/31/2019 0842   ALBUMIN 3.7 08/31/2019 0842   AST 16 08/31/2019 0842   AST 17 08/04/2019 1546   ALT 39 08/31/2019 0842   ALT 21 08/04/2019 1546   ALKPHOS 74 08/31/2019 0842   BILITOT 0.2 (L) 08/31/2019 0842   BILITOT 0.2 (L) 08/04/2019 1546   GFRNONAA >60 08/31/2019 0842   GFRNONAA >60 08/04/2019 1546   GFRNONAA 87 03/27/2015 1146   GFRAA >60 08/31/2019 0842   GFRAA >60 08/04/2019 1546   GFRAA >89 03/27/2015 1146    No results found for: TOTALPROTELP, ALBUMINELP, A1GS, A2GS, BETS, BETA2SER, GAMS, MSPIKE, SPEI  Lab Results  Component Value Date   WBC 9.3 09/14/2019   NEUTROABS 5.2 09/14/2019   HGB 11.5 (L) 09/14/2019   HCT 34.6 (L) 09/14/2019   MCV 86.9 09/14/2019   PLT 117 (L) 09/14/2019    No results found for: LABCA2  No components found for: VEHMCN470  No results for input(s): INR in the last 168 hours.  No results found for: LABCA2  No results found for: JGG836  No results found for: OQH476  No results found for: LYY503  Lab Results  Component Value Date   CA2729 27.4 08/18/2019    No components found for: HGQUANT  No results found for: CEA1 / No results found for: CEA1   No results found for: AFPTUMOR  No results found for: CHROMOGRNA  No results found for: KPAFRELGTCHN, LAMBDASER, KAPLAMBRATIO (kappa/lambda light chains)  No results found for: HGBA, HGBA2QUANT, HGBFQUANT, HGBSQUAN (Hemoglobinopathy evaluation)   No results found for: LDH  Lab Results  Component Value Date    IRON 94 02/15/2015   TIBC 272 02/15/2015   IRONPCTSAT 35 02/15/2015   (Iron and TIBC)  Lab Results  Component Value Date   FERRITIN 70 02/15/2015    Urinalysis No results found for: COLORURINE, APPEARANCEUR, LABSPEC, PHURINE, GLUCOSEU, HGBUR, BILIRUBINUR, KETONESUR, PROTEINUR, UROBILINOGEN, NITRITE, LEUKOCYTESUR   STUDIES: MR THORACIC  SPINE W WO CONTRAST  Result Date: 09/14/2019 CLINICAL DATA:  Recent diagnosis of breast cancer. Evaluate T9 lesion seen on bone scan. EXAM: MRI THORACIC WITHOUT AND WITH CONTRAST TECHNIQUE: Multiplanar and multiecho pulse sequences of the thoracic spine were obtained without and with intravenous contrast. CONTRAST:  51m GADAVIST GADOBUTROL 1 MMOL/ML IV SOLN COMPARISON:  Bone scan August 27, 2019. FINDINGS: MRI THORACIC SPINE FINDINGS Alignment:  Physiologic. Vertebrae: No fracture, evidence of discitis, or bone lesion. There is mild diffuse decrease of the T1 signal throughout the vertebral bodies, may be related to anemia. No focal bone lesion identified at T9 . There is also no suspicious focal bone lesion or abnormal contrast enhancement at any other thoracic spine level. Are Cord:  Normal signal and morphology. Paraspinal and other soft tissues: Negative. Disc levels: No significant disc bulge or herniation, spinal canal or neural foraminal stenosis seen at any level. Mild facet degenerative changes are seen at T9-T10 and T10-T11. IMPRESSION: 1. No evidence of metastatic disease of the thoracic spine. 2. Mild degenerative facet disease at T9-T10 and T10-T11. Electronically Signed   By: KPedro EarlsM.D.   On: 09/14/2019 08:38   NM Bone Scan Whole Body  Result Date: 08/27/2019 CLINICAL DATA:  Recent breast cancer diagnosis. EXAM: NUCLEAR MEDICINE WHOLE BODY BONE SCAN TECHNIQUE: Whole body anterior and posterior images were obtained approximately 3 hours after intravenous injection of radiopharmaceutical. RADIOPHARMACEUTICALS:  18.8 mCi  Technetium-976mDP IV COMPARISON:  None. FINDINGS: Mild diffusely increased tracer activity seen within the bilateral knees, likely degenerative in origin. A small focus of increased tracer activity is seen at the approximate level of the T9 vertebral body on the right. Physiologic tracer activity is seen throughout the remainder of the osseous skeleton. Physiologic tracer uptake is also seen within both kidneys. IMPRESSION: 1. Single focus of increased tracer uptake at the approximate level of the T9 vertebral body. A single metastatic focus cannot be excluded. 2. Degenerative changes within the bilateral knees. Electronically Signed   By: ThVirgina Norfolk.D.   On: 08/27/2019 16:26   MR BREAST BILATERAL W WO CONTRAST INC CAD  Addendum Date: 08/18/2019   ADDENDUM REPORT: 08/18/2019 07:49 ADDENDUM: This is an addendum to the report originally dictated on 08/16/2019. All additional positive findings are within the RIGHT breast. Additional biopsy recommendations are also for the RIGHT breast. There is no MRI evidence for malignancy on the left. The IMPRESSION and RECOMMENDATION should read as follows. IMPRESSION: 1. Extensive enhancement involving the entire upper outer and portions of the upper inner right breast at the site of patient's known malignancy. This spans approximately 7.6 x 3.9 x 5.9 cm (transverse by AP by craniocaudal dimensions). 2. Two additional, suspicious 5-6 mm enhancing masses in the inferior RIGHT breast. MRI guided biopsy can be performed if this will alter clinical management. 3. Five suspicious level 1 right axillary lymph nodes. 4. No MRI evidence of malignancy on the left. RECOMMENDATION: MRI guided biopsy of two additional RIGHT breast masses can be performed if this will alter clinical/surgical management. BI-RADS CATEGORY  4: Suspicious. Electronically Signed   By: SeKristopher Oppenheim.D.   On: 08/18/2019 07:49   Result Date: 08/18/2019 CLINICAL DATA:  [age]ear old female with  recently diagnosed right breast cancer presents for staging prior to neoadjuvant chemotherapy. LABS:  None performed on site. EXAM: BILATERAL BREAST MRI WITH AND WITHOUT CONTRAST TECHNIQUE: Multiplanar, multisequence MR images of both breasts were obtained prior to and following the intravenous administration of  6 ml of Gadavist. Three-dimensional MR images were rendered by post-processing of the original MR data on an independent workstation. The three-dimensional MR images were interpreted, and findings are reported in the following complete MRI report for this study. Three dimensional images were evaluated at the independent DynaCad workstation COMPARISON:  Previous exam(s). FINDINGS: Breast composition: c. Heterogeneous fibroglandular tissue. Background parenchymal enhancement: Mild. Right breast: Extensive, contiguous enhancement is seen involving the entire upper outer quadrant and portions of the upper inner quadrant. This spans approximately 7.6 x 3.9 x 5.9 cm (transverse by AP by craniocaudal dimensions). Two additional 5-6 mm enhancing masses are seen in the central inferior left breast at anterior and middle depth (series 6, image 77 and 80 3/144). Left breast: No mass or abnormal enhancement. Lymph nodes: Five suspicious level 1 right axillary lymph nodes are noted, with diffuse cortical thickening and hilar effacement. There are no suspicious right axillary or internal mammary chain lymph nodes. Ancillary findings:  None. IMPRESSION: 1. Extensive enhancement involving the entire upper outer and portions of the upper inner right breast at the site of patient's known malignancy. This spans approximately 7.6 x 3.9 x 5.9 cm (transverse by AP by craniocaudal dimensions). 2. Two additional, suspicious 5-6 mm enhancing masses in the inferior left breast. MRI guided biopsy can be performed if this will alter clinical management. 3. Five suspicious level 1 right axillary lymph nodes. 4. No MRI evidence of  malignancy on the left. RECOMMENDATION: MRI guided biopsy of two additional left breast masses can be performed if this will alter clinical/surgical management. BI-RADS CATEGORY  4: Suspicious. Electronically Signed: By: Kristopher Oppenheim M.D. On: 08/16/2019 12:23   DG Chest Port 1 View  Result Date: 08/17/2019 CLINICAL DATA:  Check Port-A-Cath placement EXAM: PORTABLE CHEST 1 VIEW COMPARISON:  None. FINDINGS: Cardiac shadows within normal limits. Right chest wall port is now seen with the catheter tip at the cavoatrial junction. No pneumothorax is noted. Lungs are clear. No bony abnormality is noted. IMPRESSION: Right chest wall port in satisfactory position. No pneumothorax is noted. Electronically Signed   By: Inez Catalina M.D.   On: 08/17/2019 09:42   DG Fluoro Guide CV Line-No Report  Result Date: 08/17/2019 Fluoroscopy was utilized by the requesting physician.  No radiographic interpretation.    ELIGIBLE FOR AVAILABLE RESEARCH PROTOCOL:   ASSESSMENT: 64 y.o. [place], [place] woman status post right breast biopsy x2 on 07/13/2019 for a clinical T3-T4 N1-2, stage III invasive ductal carcinoma, grade 2, estrogen receptor positive, progesterone receptor variable, HER-2 not amplified.  (a) breast MRI 08/15/2019 showed a 7.6 area of enhancement, and 5 suspicious right axillary lymph nodes as well as two 0.5 mm additional masses in the inferior right breast   (1) neoadjuvant chemotherapy consisting of cyclophosphamide and doxorubicin in dose dense fashion x4 starting 08/18/2019, to be followed by weekly paclitaxel x12  (2) definitive surgery (right modified radical mastectomy) to follow  (3) adjuvant radiation as appropriate  (4) antiestrogens to start at the completion of local treatment   PLAN: Bevely is doing remarkably well with her treatments and she will have her third of 4 planned cycles of doxorubicin and cyclophosphamide today.  I am hopeful her third Neulasta dose will be easier.   She will be taking Claritin as prescribed.  We also talked about using a combination of Aleve and Tylenol up to 3 times daily which can be extremely effective in pain management in general.  She is still cleaning about 3 houses  and has normal activity.  She will return in 2 weeks for her final dose of AC and after that she will start her weekly paclitaxel, which will be much easier on her.  She knows to call for any other issue that may develop before the next visit  Total encounter time 25 minutes.Paige Cruel, MD   09/14/2019 10:02 AM Medical Oncology and Hematology [facility] [place], [place] [address] Tel. [phone]    Fax. [phone]   This document serves as a record of services personally performed by Lurline Del, MD. It was created on his behalf by Wilburn Mylar, a trained medical scribe. The creation of this record is based on the scribe's personal observations and the provider's statements to them.   I, Lurline Del MD, have reviewed the above documentation for accuracy and completeness, and I agree with the above.   *Total Encounter Time as defined by the Centers for Medicare and Medicaid Services includes, in addition to the face-to-face time of a patient visit (documented in the note above) non-face-to-face time: obtaining and reviewing outside history, ordering and reviewing medications, tests or procedures, care coordination (communications with other health care professionals or caregivers) and documentation in the medical record.

## 2019-09-14 ENCOUNTER — Other Ambulatory Visit: Payer: Self-pay

## 2019-09-14 ENCOUNTER — Inpatient Hospital Stay: Payer: Managed Care, Other (non HMO)

## 2019-09-14 ENCOUNTER — Encounter: Payer: Self-pay | Admitting: *Deleted

## 2019-09-14 ENCOUNTER — Inpatient Hospital Stay: Payer: Managed Care, Other (non HMO) | Admitting: Oncology

## 2019-09-14 VITALS — BP 131/55 | HR 73 | Temp 97.8°F | Resp 18 | Ht 64.5 in | Wt 138.4 lb

## 2019-09-14 DIAGNOSIS — Z170 Estrogen receptor positive status [ER+]: Secondary | ICD-10-CM

## 2019-09-14 DIAGNOSIS — C50411 Malignant neoplasm of upper-outer quadrant of right female breast: Secondary | ICD-10-CM

## 2019-09-14 DIAGNOSIS — Z5111 Encounter for antineoplastic chemotherapy: Secondary | ICD-10-CM | POA: Diagnosis not present

## 2019-09-14 LAB — CBC WITH DIFFERENTIAL (CANCER CENTER ONLY)
Abs Immature Granulocytes: 0.2 10*3/uL — ABNORMAL HIGH (ref 0.00–0.07)
Band Neutrophils: 2 %
Basophils Absolute: 0.2 10*3/uL — ABNORMAL HIGH (ref 0.0–0.1)
Basophils Relative: 2 %
Eosinophils Absolute: 0.2 10*3/uL (ref 0.0–0.5)
Eosinophils Relative: 2 %
HCT: 34.6 % — ABNORMAL LOW (ref 36.0–46.0)
Hemoglobin: 11.5 g/dL — ABNORMAL LOW (ref 12.0–15.0)
Lymphocytes Relative: 37 %
Lymphs Abs: 3.4 10*3/uL (ref 0.7–4.0)
MCH: 28.9 pg (ref 26.0–34.0)
MCHC: 33.2 g/dL (ref 30.0–36.0)
MCV: 86.9 fL (ref 80.0–100.0)
Metamyelocytes Relative: 1 %
Monocytes Absolute: 0.1 10*3/uL (ref 0.1–1.0)
Monocytes Relative: 1 %
Myelocytes: 1 %
Neutro Abs: 5.2 10*3/uL (ref 1.7–7.7)
Neutrophils Relative %: 54 %
Platelet Count: 117 10*3/uL — ABNORMAL LOW (ref 150–400)
RBC: 3.98 MIL/uL (ref 3.87–5.11)
RDW: 13.5 % (ref 11.5–15.5)
WBC Count: 9.3 10*3/uL (ref 4.0–10.5)
nRBC: 0 % (ref 0.0–0.2)

## 2019-09-14 LAB — COMPREHENSIVE METABOLIC PANEL
ALT: 27 U/L (ref 0–44)
AST: 15 U/L (ref 15–41)
Albumin: 3.7 g/dL (ref 3.5–5.0)
Alkaline Phosphatase: 91 U/L (ref 38–126)
Anion gap: 9 (ref 5–15)
BUN: 12 mg/dL (ref 8–23)
CO2: 24 mmol/L (ref 22–32)
Calcium: 8.9 mg/dL (ref 8.9–10.3)
Chloride: 108 mmol/L (ref 98–111)
Creatinine, Ser: 0.71 mg/dL (ref 0.44–1.00)
GFR calc Af Amer: >60 mL/min (ref >60)
GFR calc non Af Amer: >60 mL/min (ref >60)
Glucose, Bld: 91 mg/dL (ref 70–99)
Potassium: 4.2 mmol/L (ref 3.5–5.1)
Sodium: 141 mmol/L (ref 135–145)
Total Bilirubin: <0.2 mg/dL — ABNORMAL LOW (ref 0.3–1.2)
Total Protein: 6.6 g/dL (ref 6.5–8.1)

## 2019-09-14 MED ORDER — SODIUM CHLORIDE 0.9% FLUSH
10.0000 mL | INTRAVENOUS | Status: DC | PRN
  Administered 2019-09-14: 10 mL
  Filled 2019-09-14: qty 10

## 2019-09-14 MED ORDER — DEXAMETHASONE SODIUM PHOSPHATE 10 MG/ML IJ SOLN
10.0000 mg | Freq: Once | INTRAMUSCULAR | Status: AC
  Administered 2019-09-14: 10 mg via INTRAVENOUS

## 2019-09-14 MED ORDER — PALONOSETRON HCL INJECTION 0.25 MG/5ML
INTRAVENOUS | Status: AC
  Filled 2019-09-14: qty 5

## 2019-09-14 MED ORDER — PALONOSETRON HCL INJECTION 0.25 MG/5ML
0.2500 mg | Freq: Once | INTRAVENOUS | Status: AC
  Administered 2019-09-14: 0.25 mg via INTRAVENOUS

## 2019-09-14 MED ORDER — DEXAMETHASONE SODIUM PHOSPHATE 10 MG/ML IJ SOLN
INTRAMUSCULAR | Status: AC
  Filled 2019-09-14: qty 1

## 2019-09-14 MED ORDER — SODIUM CHLORIDE 0.9 % IV SOLN
Freq: Once | INTRAVENOUS | Status: AC
  Filled 2019-09-14: qty 250

## 2019-09-14 MED ORDER — SODIUM CHLORIDE 0.9 % IV SOLN
600.0000 mg/m2 | Freq: Once | INTRAVENOUS | Status: AC
  Administered 2019-09-14: 1020 mg via INTRAVENOUS
  Filled 2019-09-14: qty 51

## 2019-09-14 MED ORDER — DOXORUBICIN HCL CHEMO IV INJECTION 2 MG/ML
60.0000 mg/m2 | Freq: Once | INTRAVENOUS | Status: AC
  Administered 2019-09-14: 102 mg via INTRAVENOUS
  Filled 2019-09-14: qty 51

## 2019-09-14 MED ORDER — SODIUM CHLORIDE 0.9 % IV SOLN
150.0000 mg | Freq: Once | INTRAVENOUS | Status: AC
  Administered 2019-09-14: 150 mg via INTRAVENOUS
  Filled 2019-09-14: qty 150

## 2019-09-14 MED ORDER — HEPARIN SOD (PORK) LOCK FLUSH 100 UNIT/ML IV SOLN
500.0000 [IU] | Freq: Once | INTRAVENOUS | Status: AC | PRN
  Administered 2019-09-14: 500 [IU]
  Filled 2019-09-14: qty 5

## 2019-09-14 MED FILL — NEULASTA 6 MG/0.6 ML SYRN: 6 | 14 days supply | Qty: 1 | Fill #1

## 2019-09-14 NOTE — Patient Instructions (Signed)
[facility] Discharge Instructions for Patients Receiving Chemotherapy  Today you received the following chemotherapy agents Adriamycin and Cytoxan  To help prevent nausea and vomiting after your treatment, we encourage you to take your nausea medication as directed.  If you develop nausea and vomiting that is not controlled by your nausea medication, call the clinic.   BELOW ARE SYMPTOMS THAT SHOULD BE REPORTED IMMEDIATELY:  *FEVER GREATER THAN 100.5 F  *CHILLS WITH OR WITHOUT FEVER  NAUSEA AND VOMITING THAT IS NOT CONTROLLED WITH YOUR NAUSEA MEDICATION  *UNUSUAL SHORTNESS OF BREATH  *UNUSUAL BRUISING OR BLEEDING  TENDERNESS IN MOUTH AND THROAT WITH OR WITHOUT PRESENCE OF ULCERS  *URINARY PROBLEMS  *BOWEL PROBLEMS  UNUSUAL RASH Items with * indicate a potential emergency and should be followed up as soon as possible.  Feel free to call the clinic should you have any questions or concerns. The clinic phone number is [phone].  Please show the CHEMO ALERT CARD at check-in to the Emergency Department and triage nurse.   

## 2019-09-14 NOTE — Patient Instructions (Signed)

## 2019-09-16 ENCOUNTER — Ambulatory Visit: Payer: Managed Care, Other (non HMO)

## 2019-09-27 NOTE — Progress Notes (Signed)
[facility]  [phone]) [phone] Fax:([phone]) [phone]     ID: Paige Gilbert DOB: 12-26-1955  MR#: [id number]  Paige Gilbert#:[id number]  Patient Care Team: [name], NP as PCP - General (Family Medicine) [name], RN as Oncology Nurse Navigator [name], RN as Oncology Nurse Navigator [name], [name], MD as Consulting Physician (Oncology) [name], MD as Attending Physician (Radiation Oncology) [name], MD as Consulting Physician (General Surgery) [name], MD OTHER MD:  CHIEF COMPLAINT: Locally advanced estrogen receptor positive breast cancer  CURRENT TREATMENT: Neoadjuvant chemotherapy   INTERVAL HISTORY: Paige Gilbert returns today for follow up and treatment of her locally advanced breast cancer.  She continues on the more intensive portion of her neoadjuvant chemotherapy consisting of cyclophosphamide and doxorubicin. Today is her final dose and she will start weekly paclitaxel in 2 weeks.   She obtains her own Neulasta from the pharmacy, at no out-of-pocket cost..  Her husband administers it.  She is no longer having significant bone aches from this.  REVIEW OF SYSTEMS: Paige Gilbert is having a "gel phenomenon" in the mornings, when she gets up.  She feels very stiff and like she is 64 years old.  Everything hurts a little.  Also she is developing associated nausea.  She does not actually vomit but just reading about breast cancer or looking at the building makes her feel nauseated aside from this she is doing terrific and continues to work particularly on Fridays.  A detailed review of systems today was otherwise stable.   HISTORY OF CURRENT ILLNESS: From the original intake note:  Paige Gilbert has noted a difference between her right and left breast for several years.  She last had a mammogram she thinks about 7 or 8 years ago.  More recently it seemed to her that her right breast was becoming more firm.  She brought it  to medical attention and underwent bilateral diagnostic mammography with tomography and right breast ultrasonography at [facility] on 07/07/2019 showing: heterogeneously dense breast tissue; right nipple retraction; architectural distortion with microcalcifications and irregular dense tissue in the upper-outer quadrant; at least 3 abnormal right axillary lymph nodes.  Accordingly on 07/13/2019 she proceeded to biopsy of the right breast area in question. The pathology from this procedure ([id number]) showed:   1. Right Breast, upper-outer quadrant, closer to the nipple  - invasive ductal carcinoma with micropapillary features, grade 2  - ductal carcinoma in situ, grade 2-3, solid and cribriform pattern with comedonecrosis  - Prognostic indicators significant for: estrogen receptor, 100% positive with strong staining intensity and progesterone receptor, 0% negative. HER2 equivocal by immunohistochemistry (2+), but negative by fluorescent in situ hybridization with a signals ratio 1.5 and number per cell 3.8.  2. Right Breast, upper-outer quadrant, axillary tail  - invasive ductal carcinoma, grade 2  - ductal carcinoma in situ, grade 2-3  - Prognostic indicators significant for: estrogen receptor, 100% positive and progesterone receptor, 50% positive, both with strong staining intensity. HER2 equivocal by immunohistochemistry (2+), but negative by fluorescent in situ hybridization with a signals ratio 1.9 and number per cell 5.3.  The patient's subsequent history is as detailed below.   PAST MEDICAL HISTORY: Past Medical History:  Diagnosis Date  . Anemia   . right breast ca dx'd 06/2019  . Varicose vein   . Varicose veins of left lower extremity    "bluging ones at top- no pain"    PAST SURGICAL HISTORY: Past Surgical History:  Procedure Laterality Date  .  NO PAST SURGERIES    . none    . PORTACATH PLACEMENT Right 08/17/2019   Procedure: INSERTION PORT-A-CATH WITH ULTRASOUND;   Surgeon: [name], MD;  Location: [place];  Service: General;  Laterality: Right;  . wisdom teeth removal     remotely.     FAMILY HISTORY: Family History  Problem Relation Age of Onset  . Diabetes Father   . Blindness Mother   . Congestive Heart Failure Sister   . Gallbladder disease Sister        removed  . Diabetes Sister   . Colitis Sister    The patient's father died at age 66 from a stroke.  The patient's mother died from multiple medical problems not related to cancer at age 60.  The patient had 4 brothers, 7 sisters.  There is no breast ovarian or prostate cancer in the family to her knowledge.  She had one cousin with pancreatic cancer.  GYNECOLOGIC HISTORY:  No LMP recorded. Patient is postmenopausal. Menarche: 64 years old Age at first live birth: 64 years old Floyd P 5 LMP 54 Contraceptive: Brief use of oral contraceptives as a teenager HRT no Hysterectomy?  No BSO?    SOCIAL HISTORY: (updated 07/2019)  [name] works in a Engineer, agricultural, and Education administrator houses.  Her husband of 42 years Paige Gilbert is a Chief Operating Officer.  Their children are [name], 30, who lives in [place] and is a Veterinary surgeon in the [employer]; [name] 33 who works as a Designer, jewellery at [employer]; [name] who lives in [place], and is not currently employed; [name] lives in [place], and [name] who is an Armed forces training and education officer in [place].  The patient has 4 grandchildren all under the age of 34    ADVANCED DIRECTIVES: Husband Paige Gilbert is her HCPOA.   HEALTH MAINTENANCE: Social History   Tobacco Use  . Smoking status: Never Smoker  . Smokeless tobacco: Never Used  Substance Use Topics  . Alcohol use: Yes    Comment: rarely  . Drug use: No     Colonoscopy: never done  PAP: Remote  Bone density: Never   No Known Allergies  Current Outpatient Medications  Medication Sig Dispense Refill  . Calcium Citrate-Vitamin D (CALCIUM CITRATE + D PO) Take 1 tablet by mouth  2 (two) times daily.     Marland Kitchen dexamethasone (DECADRON) 4 MG tablet Take 2 tablets by mouth once a day on the day after chemotherapy and then take 2 tablets two times a day for 2 days. Take with food. 30 tablet 1  . ibuprofen (ADVIL) 800 MG tablet Take 1 tablet (800 mg total) by mouth every 8 (eight) hours as needed. 30 tablet 0  . lidocaine-prilocaine (EMLA) cream Apply to affected area once 30 g 3  . loratadine (CLARITIN) 10 MG tablet Take 1 tablet (10 mg total) by mouth daily. (Patient taking differently: Take 10 mg by mouth daily. Take day 2,  3 , 4------------chemo) 60 tablet 1  . LORazepam (ATIVAN) 0.5 MG tablet Take 1 tablet (0.5 mg total) by mouth at bedtime as needed (Nausea or vomiting). 30 tablet 0  . pegfilgrastim (NEULASTA) 6 MG/0.6ML injection Inject 0.6 mLs (6 mg total) into the skin every 14 (fourteen) days. 1 Syringe 2  . prochlorperazine (COMPAZINE) 10 MG tablet Take 1 tablet (10 mg total) by mouth every 6 (six) hours as needed (Nausea or vomiting). 30 tablet 1   No current facility-administered medications for this visit.    OBJECTIVE: Middle-aged  white woman who appears younger than stated age  43:   09/28/19 1220  BP: (!) 103/58  Pulse: 67  Resp: 18  Temp: 98.2 F (36.8 C)  SpO2: 99%     Body mass index is 22.97 kg/m.   Wt Readings from Last 3 Encounters:  09/28/19 135 lb 14.4 oz (61.6 kg)  09/14/19 138 lb 6.4 oz (62.8 kg)  09/07/19 137 lb 8 oz (62.4 kg)      ECOG FS:1 - Symptomatic but completely ambulatory  Sclerae unicteric, EOMs intact Wearing a mask No cervical or supraclavicular adenopathy Lungs no rales or rhonchi Heart regular rate and rhythm Abd soft, nontender, positive bowel sounds MSK no focal spinal tenderness, no upper extremity lymphedema Neuro: nonfocal, well oriented, appropriate affect Breasts: The area of concern in the right breast is slightly softer, and appears more movable.  The left breast is benign.  Both axillae are benign.    Right breast 08/13/2018     LAB RESULTS:  CMP     Component Value Date/Time   NA 141 09/28/2019 1210   K 4.1 09/28/2019 1210   CL 107 09/28/2019 1210   CO2 24 09/28/2019 1210   GLUCOSE 94 09/28/2019 1210   BUN 13 09/28/2019 1210   CREATININE 0.72 09/28/2019 1210   CREATININE 0.79 08/04/2019 1546   CREATININE 0.76 03/27/2015 1146   CALCIUM 9.0 09/28/2019 1210   PROT 6.4 (L) 09/28/2019 1210   ALBUMIN 3.9 09/28/2019 1210   AST 15 09/28/2019 1210   AST 17 08/04/2019 1546   ALT 17 09/28/2019 1210   ALT 21 08/04/2019 1546   ALKPHOS 95 09/28/2019 1210   BILITOT 0.2 (L) 09/28/2019 1210   BILITOT 0.2 (L) 08/04/2019 1546   GFRNONAA >60 09/28/2019 1210   GFRNONAA >60 08/04/2019 1546   GFRNONAA 87 03/27/2015 1146   GFRAA >60 09/28/2019 1210   GFRAA >60 08/04/2019 1546   GFRAA >89 03/27/2015 1146    No results found for: TOTALPROTELP, ALBUMINELP, A1GS, A2GS, BETS, BETA2SER, GAMS, MSPIKE, SPEI  Lab Results  Component Value Date   WBC 11.2 (H) 09/28/2019   NEUTROABS 8.2 (H) 09/28/2019   HGB 10.8 (L) 09/28/2019   HCT 32.2 (L) 09/28/2019   MCV 88.7 09/28/2019   PLT 161 09/28/2019    No results found for: LABCA2  No components found for: GEZMOQ947  No results for input(s): INR in the last 168 hours.  No results found for: LABCA2  No results found for: MLY650  No results found for: PTW656  No results found for: CLE751  Lab Results  Component Value Date   CA2729 27.4 08/18/2019    No components found for: HGQUANT  No results found for: CEA1 / No results found for: CEA1   No results found for: AFPTUMOR  No results found for: CHROMOGRNA  No results found for: KPAFRELGTCHN, LAMBDASER, KAPLAMBRATIO (kappa/lambda light chains)  No results found for: HGBA, HGBA2QUANT, HGBFQUANT, HGBSQUAN (Hemoglobinopathy evaluation)   No results found for: LDH  Lab Results  Component Value Date   IRON 94 02/15/2015   TIBC 272 02/15/2015   IRONPCTSAT 35 02/15/2015    (Iron and TIBC)  Lab Results  Component Value Date   FERRITIN 70 02/15/2015    Urinalysis No results found for: COLORURINE, APPEARANCEUR, LABSPEC, PHURINE, GLUCOSEU, HGBUR, BILIRUBINUR, KETONESUR, PROTEINUR, UROBILINOGEN, NITRITE, LEUKOCYTESUR   STUDIES: MR THORACIC SPINE W WO CONTRAST  Result Date: 09/14/2019 CLINICAL DATA:  Recent diagnosis of breast cancer. Evaluate T9 lesion seen on bone scan.  EXAM: MRI THORACIC WITHOUT AND WITH CONTRAST TECHNIQUE: Multiplanar and multiecho pulse sequences of the thoracic spine were obtained without and with intravenous contrast. CONTRAST:  39m GADAVIST GADOBUTROL 1 MMOL/ML IV SOLN COMPARISON:  Bone scan August 27, 2019. FINDINGS: MRI THORACIC SPINE FINDINGS Alignment:  Physiologic. Vertebrae: No fracture, evidence of discitis, or bone lesion. There is mild diffuse decrease of the T1 signal throughout the vertebral bodies, may be related to anemia. No focal bone lesion identified at T9 . There is also no suspicious focal bone lesion or abnormal contrast enhancement at any other thoracic spine level. Are Cord:  Normal signal and morphology. Paraspinal and other soft tissues: Negative. Disc levels: No significant disc bulge or herniation, spinal canal or neural foraminal stenosis seen at any level. Mild facet degenerative changes are seen at T9-T10 and T10-T11. IMPRESSION: 1. No evidence of metastatic disease of the thoracic spine. 2. Mild degenerative facet disease at T9-T10 and T10-T11. Electronically Signed   By: KPedro EarlsM.D.   On: 09/14/2019 08:38    ELIGIBLE FOR AVAILABLE RESEARCH PROTOCOL:   ASSESSMENT: 64y.o. [place] status post right breast biopsy x2 on 07/13/2019 for a clinical T3-T4 N1-2, stage III invasive ductal carcinoma, grade 2, estrogen receptor positive, progesterone receptor variable, HER-2 not amplified.  (a) breast MRI 08/15/2019 showed a 7.6 area of enhancement, and 5 suspicious right axillary lymph  nodes as well as two 0.5 mm additional masses in the inferior right breast   (1) neoadjuvant chemotherapy consisting of cyclophosphamide and doxorubicin in dose dense fashion x4 starting 08/18/2019, completed 09/28/2019, to be followed by weekly paclitaxel x12 starting 10/12/2019  (2) definitive surgery (right modified radical mastectomy) to follow  (3) adjuvant radiation as appropriate  (4) antiestrogens to start at the completion of local treatment   PLAN: Nhyira did remarkably well with her first 4 doses of intense chemotherapy.  She also tolerated the Neulasta well.  There were no dose reductions or delays.  She will be starting her weekly paclitaxel 10/12/2019.  We discussed the possible toxicities side effects and complications in detail today and I particularly warned her regarding neuropathy issues.  I reassured her that the "gel phenomenon" that she is experiencing is normal and will go away a few months after she completes chemotherapy especially she continues to exercise and do stretching.  She is developing associated nausea.  She is going to try lorazepam when she arrives here and she will also try deltoids or similar to "fog up" her sense of smell.  She will see [place] with her first Taxol treatment 2 weeks from today.  She knows to call for any other issue that may develop before the next visit.  Total encounter time 35 minutes.*Sarajane JewsC. Djon Tith, MD 09/28/2019 12:52 PM [facility] [id number]. [phone]   Fax. [phone]  This document serves as a record of services personally performed by GLurline Del MD. It was created on his behalf by KWilburn Mylar a trained medical scribe. The creation of this record is based on the scribe's personal observations and the provider's statements to them.   I, GLurline DelMD, have reviewed the above documentation for accuracy and  completeness, and I agree with the above.   *Total Encounter Time as defined by the Centers for Medicare and Medicaid Services includes, in addition to the face-to-face time of a patient visit (documented in  the note above) non-face-to-face time: obtaining and reviewing outside history, ordering and reviewing medications, tests or procedures, care coordination (communications with other health care professionals or caregivers) and documentation in the medical record.

## 2019-09-28 ENCOUNTER — Inpatient Hospital Stay: Payer: Managed Care, Other (non HMO)

## 2019-09-28 ENCOUNTER — Inpatient Hospital Stay: Payer: Managed Care, Other (non HMO) | Attending: Oncology | Admitting: Oncology

## 2019-09-28 ENCOUNTER — Other Ambulatory Visit: Payer: Self-pay

## 2019-09-28 ENCOUNTER — Encounter: Payer: Self-pay | Admitting: *Deleted

## 2019-09-28 VITALS — BP 103/58 | HR 67 | Temp 98.2°F | Resp 18 | Ht 64.5 in | Wt 135.9 lb

## 2019-09-28 DIAGNOSIS — C50411 Malignant neoplasm of upper-outer quadrant of right female breast: Secondary | ICD-10-CM

## 2019-09-28 DIAGNOSIS — Z5111 Encounter for antineoplastic chemotherapy: Secondary | ICD-10-CM | POA: Insufficient documentation

## 2019-09-28 DIAGNOSIS — Z170 Estrogen receptor positive status [ER+]: Secondary | ICD-10-CM | POA: Insufficient documentation

## 2019-09-28 LAB — CBC WITH DIFFERENTIAL/PLATELET
Abs Immature Granulocytes: 0.81 10*3/uL — ABNORMAL HIGH (ref 0.00–0.07)
Basophils Absolute: 0.1 10*3/uL (ref 0.0–0.1)
Basophils Relative: 1 %
Eosinophils Absolute: 0 10*3/uL (ref 0.0–0.5)
Eosinophils Relative: 0 %
HCT: 32.2 % — ABNORMAL LOW (ref 36.0–46.0)
Hemoglobin: 10.8 g/dL — ABNORMAL LOW (ref 12.0–15.0)
Immature Granulocytes: 7 %
Lymphocytes Relative: 12 %
Lymphs Abs: 1.3 10*3/uL (ref 0.7–4.0)
MCH: 29.8 pg (ref 26.0–34.0)
MCHC: 33.5 g/dL (ref 30.0–36.0)
MCV: 88.7 fL (ref 80.0–100.0)
Monocytes Absolute: 0.7 10*3/uL (ref 0.1–1.0)
Monocytes Relative: 7 %
Neutro Abs: 8.2 10*3/uL — ABNORMAL HIGH (ref 1.7–7.7)
Neutrophils Relative %: 73 %
Platelets: 161 10*3/uL (ref 150–400)
RBC: 3.63 MIL/uL — ABNORMAL LOW (ref 3.87–5.11)
RDW: 16 % — ABNORMAL HIGH (ref 11.5–15.5)
WBC: 11.2 10*3/uL — ABNORMAL HIGH (ref 4.0–10.5)
nRBC: 0.3 % — ABNORMAL HIGH (ref 0.0–0.2)

## 2019-09-28 LAB — COMPREHENSIVE METABOLIC PANEL
ALT: 17 U/L (ref 0–44)
AST: 15 U/L (ref 15–41)
Albumin: 3.9 g/dL (ref 3.5–5.0)
Alkaline Phosphatase: 95 U/L (ref 38–126)
Anion gap: 10 (ref 5–15)
BUN: 13 mg/dL (ref 8–23)
CO2: 24 mmol/L (ref 22–32)
Calcium: 9 mg/dL (ref 8.9–10.3)
Chloride: 107 mmol/L (ref 98–111)
Creatinine, Ser: 0.72 mg/dL (ref 0.44–1.00)
GFR calc Af Amer: >60 mL/min (ref >60)
GFR calc non Af Amer: >60 mL/min (ref >60)
Glucose, Bld: 94 mg/dL (ref 70–99)
Potassium: 4.1 mmol/L (ref 3.5–5.1)
Sodium: 141 mmol/L (ref 135–145)
Total Bilirubin: 0.2 mg/dL — ABNORMAL LOW (ref 0.3–1.2)
Total Protein: 6.4 g/dL — ABNORMAL LOW (ref 6.5–8.1)

## 2019-09-28 MED ORDER — SODIUM CHLORIDE 0.9 % IV SOLN
Freq: Once | INTRAVENOUS | Status: AC
  Filled 2019-09-28: qty 250

## 2019-09-28 MED ORDER — SODIUM CHLORIDE 0.9% FLUSH
10.0000 mL | INTRAVENOUS | Status: DC | PRN
  Administered 2019-09-28: 10 mL
  Filled 2019-09-28: qty 10

## 2019-09-28 MED ORDER — PALONOSETRON HCL INJECTION 0.25 MG/5ML
0.2500 mg | Freq: Once | INTRAVENOUS | Status: AC
  Administered 2019-09-28: 0.25 mg via INTRAVENOUS

## 2019-09-28 MED ORDER — DEXAMETHASONE SODIUM PHOSPHATE 10 MG/ML IJ SOLN
10.0000 mg | Freq: Once | INTRAMUSCULAR | Status: AC
  Administered 2019-09-28: 10 mg via INTRAVENOUS

## 2019-09-28 MED ORDER — SODIUM CHLORIDE 0.9 % IV SOLN
600.0000 mg/m2 | Freq: Once | INTRAVENOUS | Status: AC
  Administered 2019-09-28: 1020 mg via INTRAVENOUS
  Filled 2019-09-28: qty 51

## 2019-09-28 MED ORDER — DOXORUBICIN HCL CHEMO IV INJECTION 2 MG/ML
60.0000 mg/m2 | Freq: Once | INTRAVENOUS | Status: AC
  Administered 2019-09-28: 102 mg via INTRAVENOUS
  Filled 2019-09-28: qty 51

## 2019-09-28 MED ORDER — SODIUM CHLORIDE 0.9 % IV SOLN
150.0000 mg | Freq: Once | INTRAVENOUS | Status: AC
  Administered 2019-09-28: 150 mg via INTRAVENOUS
  Filled 2019-09-28: qty 150

## 2019-09-28 MED ORDER — PALONOSETRON HCL INJECTION 0.25 MG/5ML
INTRAVENOUS | Status: AC
  Filled 2019-09-28: qty 5

## 2019-09-28 MED ORDER — DEXAMETHASONE SODIUM PHOSPHATE 10 MG/ML IJ SOLN
INTRAMUSCULAR | Status: AC
  Filled 2019-09-28: qty 1

## 2019-09-28 MED ORDER — HEPARIN SOD (PORK) LOCK FLUSH 100 UNIT/ML IV SOLN
500.0000 [IU] | Freq: Once | INTRAVENOUS | Status: AC | PRN
  Administered 2019-09-28: 500 [IU]
  Filled 2019-09-28: qty 5

## 2019-09-28 MED FILL — NEULASTA 6 MG/0.6 ML SYRN: 6 | 14 days supply | Qty: 1 | Fill #2

## 2019-09-28 NOTE — Patient Instructions (Signed)
[facility] Discharge Instructions for Patients Receiving Chemotherapy  Today you received the following chemotherapy agents: Adriamycin, cytoxan  To help prevent nausea and vomiting after your treatment, we encourage you to take your nausea medication as directed.   If you develop nausea and vomiting that is not controlled by your nausea medication, call the clinic.   BELOW ARE SYMPTOMS THAT SHOULD BE REPORTED IMMEDIATELY:  *FEVER GREATER THAN 100.5 F  *CHILLS WITH OR WITHOUT FEVER  NAUSEA AND VOMITING THAT IS NOT CONTROLLED WITH YOUR NAUSEA MEDICATION  *UNUSUAL SHORTNESS OF BREATH  *UNUSUAL BRUISING OR BLEEDING  TENDERNESS IN MOUTH AND THROAT WITH OR WITHOUT PRESENCE OF ULCERS  *URINARY PROBLEMS  *BOWEL PROBLEMS  UNUSUAL RASH Items with * indicate a potential emergency and should be followed up as soon as possible.  Feel free to call the clinic should you have any questions or concerns. The clinic phone number is [phone].  Please show the [facility] at check-in to the Emergency Department and triage nurse.

## 2019-09-29 ENCOUNTER — Telehealth: Payer: Self-pay | Admitting: Oncology

## 2019-09-29 NOTE — Telephone Encounter (Signed)
I left a message regarding schedule  

## 2019-10-04 ENCOUNTER — Encounter: Payer: Self-pay | Admitting: *Deleted

## 2019-10-11 ENCOUNTER — Telehealth: Payer: Self-pay | Admitting: *Deleted

## 2019-10-11 NOTE — Telephone Encounter (Signed)
Pt called to state she is scheduled to receive her 1st Covid vaccine 3/19.  Taxol scheduled 3/16 and then weekly.  Per MD recommendation pt is to hold chemo 3/23 ( goal is to allow 10-14 days immunity response post vaccination ).  Above discussed with pt as well as this RN cancelled chemo per above date.

## 2019-10-11 NOTE — Progress Notes (Signed)
Paige Gilbert  [phone] Fax:(336) [phone]     ID: Paige Gilbert DOB: November 17, 1960  MR#: [id number]  SNK#:[id number]  Patient Care Team: [name], NP as PCP - General (Family Medicine) [name], RN as Oncology Nurse Navigator [name], RN as Oncology Nurse Navigator Magrinat, [name], MD as Consulting Physician (Oncology) Paige Gibson, MD as Attending Physician (Radiation Oncology) [name], MD as Consulting Physician (General Surgery) Scot Dock, NP OTHER MD:  CHIEF COMPLAINT: Locally advanced estrogen receptor positive breast cancer  CURRENT TREATMENT: Neoadjuvant chemotherapy   INTERVAL HISTORY: Paige Gilbert returns today for follow up and treatment of her locally advanced breast cancer.  She continues on neoadjuvant chemotherapy and has completed her first four cycles of Doxorubicin and Cyclophsophamide.  She is now due to start weekly Paclitaxel x 12.  Today is week 1 of treatment.    She notes that her right breast mass is improving, it is still present, but isn't as attached as it previously was.    REVIEW OF SYSTEMS: [name] is doing well today.  She notes her activity level is at about 60% of what it should be.  She wonders if there is a tumor marker that we are tracking as it relates to her cancer.  She also wants to know why she didn't have surgery first.  She says that her final Doxorubicin and Cyclophosphamide really made her tired.  She is looking forward to a less intense chemotherapy.  [name] notes that she is going to receive her first COVID vaccine, this Friday, 10/15/19.  [name] denies any fever, cillos, chest pain, palpitations, cough, shortness of breath, bowel/bladder changes, or nausea/vomiting.  A detailed ROS Was otherwise non contributory.    HISTORY OF CURRENT ILLNESS: From the original intake note:  Paige Gilbert has noted a difference between her right and left breast for several  years.  She last had a mammogram she thinks about 7 or 8 years ago.  More recently it seemed to her that her right breast was becoming more firm.  She brought it to medical attention and underwent bilateral diagnostic mammography with tomography and right breast ultrasonography at [facility] on 07/07/2019 showing: heterogeneously dense breast tissue; right nipple retraction; architectural distortion with microcalcifications and irregular dense tissue in the upper-outer quadrant; at least 3 abnormal right axillary lymph nodes.  Accordingly on 07/13/2019 she proceeded to biopsy of the right breast area in question. The pathology from this procedure ([id number]) showed:   1. Right Breast, upper-outer quadrant, closer to the nipple  - invasive ductal carcinoma with micropapillary features, grade 2  - ductal carcinoma in situ, grade 2-3, solid and cribriform pattern with comedonecrosis  - Prognostic indicators significant for: estrogen receptor, 100% positive with strong staining intensity and progesterone receptor, 0% negative. HER2 equivocal by immunohistochemistry (2+), but negative by fluorescent in situ hybridization with a signals ratio 1.5 and number per cell 3.8.  2. Right Breast, upper-outer quadrant, axillary tail  - invasive ductal carcinoma, grade 2  - ductal carcinoma in situ, grade 2-3  - Prognostic indicators significant for: estrogen receptor, 100% positive and progesterone receptor, 50% positive, both with strong staining intensity. HER2 equivocal by immunohistochemistry (2+), but negative by fluorescent in situ hybridization with a signals ratio 1.9 and number per cell 5.3.  The patient's subsequent history is as detailed below.   PAST MEDICAL HISTORY: Past Medical History:  Diagnosis Date  . Anemia   . right breast ca dx'd 06/2019  .  Varicose vein   . Varicose veins of left lower extremity    "bluging ones at top- no pain"    PAST SURGICAL HISTORY: Past Surgical History:    Procedure Laterality Date  . NO PAST SURGERIES    . none    . PORTACATH PLACEMENT Right 08/17/2019   Procedure: INSERTION PORT-A-CATH WITH ULTRASOUND;  Surgeon: [name], MD;  Location: [place];  Service: General;  Laterality: Right;  . wisdom teeth removal     remotely.     FAMILY HISTORY: Family History  Problem Relation Age of Onset  . Diabetes Father   . Blindness Mother   . Congestive Heart Failure Sister   . Gallbladder disease Sister        removed  . Diabetes Sister   . Colitis Sister    The patient's father died at age 77 from a stroke.  The patient's mother died from multiple medical problems not related to cancer at age 31.  The patient had 4 brothers, 7 sisters.  There is no breast ovarian or prostate cancer in the family to her knowledge.  She had one cousin with pancreatic cancer.  GYNECOLOGIC HISTORY:  No LMP recorded. Patient is postmenopausal. Menarche: 64 years old Age at first live birth: 64 years old [name] 5 LMP 82 Contraceptive: Brief use of oral contraceptives as a teenager HRT no Hysterectomy?  No BSO?    SOCIAL HISTORY: (updated 07/2019)  Melaysia works in a Engineer, agricultural, and Education administrator houses.  Her husband of 55 years [name] is a Chief Operating Officer.  Their children are [name], 47, who lives in [place] and is a Veterinary surgeon in the [employer]; [name] 33 who works as a Designer, jewellery at [employer]; [name] who lives in [place], and is not currently employed; [name] lives in [place], and [name] who is an Armed forces training and education officer in [place].  The patient has 4 grandchildren all under the age of 16    ADVANCED DIRECTIVES: Husband [name] is her HCPOA.   HEALTH MAINTENANCE: Social History   Tobacco Use  . Smoking status: Never Smoker  . Smokeless tobacco: Never Used  Substance Use Topics  . Alcohol use: Yes    Comment: rarely  . Drug use: No     Colonoscopy: never done  PAP: Remote  Bone density: Never   No  Known Allergies  Current Outpatient Medications  Medication Sig Dispense Refill  . Calcium Citrate-Vitamin D (CALCIUM CITRATE + D PO) Take 1 tablet by mouth 2 (two) times daily.     Marland Kitchen ibuprofen (ADVIL) 800 MG tablet Take 1 tablet (800 mg total) by mouth every 8 (eight) hours as needed. 30 tablet 0  . lidocaine-prilocaine (EMLA) cream Apply to affected area once 30 g 3  . LORazepam (ATIVAN) 0.5 MG tablet Take 1 tablet (0.5 mg total) by mouth at bedtime as needed (Nausea or vomiting). 30 tablet 0  . prochlorperazine (COMPAZINE) 10 MG tablet Take 1 tablet (10 mg total) by mouth every 6 (six) hours as needed (Nausea or vomiting). 30 tablet 1   No current facility-administered medications for this visit.   Facility-Administered Medications Ordered in Other Visits  Medication Dose Route Frequency Provider Last Rate Last Admin  . 0.9 %  sodium chloride infusion   Intravenous Once Magrinat, [name], MD      . dexamethasone (DECADRON) injection 10 mg  10 mg Intravenous Once Magrinat, [name], MD      . diphenhydrAMINE (BENADRYL) injection 25 mg  25 mg Intravenous Once Magrinat, [name], MD      . famotidine (PEPCID) IVPB 20 mg premix  20 mg Intravenous Once Magrinat, [name], MD      . heparin lock flush 100 unit/mL  500 Units Intracatheter Once PRN Magrinat, [name], MD      . PACLitaxel (TAXOL) 138 mg in sodium chloride 0.9 % 250 mL chemo infusion (</= 71m/m2)  80 mg/m2 (Treatment Plan Recorded) Intravenous Once Magrinat, GVirgie Dad MD      . sodium chloride flush (NS) 0.9 % injection 10 mL  10 mL Intracatheter PRN Magrinat, GVirgie Dad MD        OBJECTIVE: Middle-aged white woman in no acute distress  Vitals:   10/12/19 1002  BP: (!) 111/46  Pulse: 69  Resp: 20  Temp: 98.2 F (36.8 C)  SpO2: 100%     Body mass index is 22.68 kg/m.   Wt Readings from Last 3 Encounters:  10/12/19 134 lb 3.2 oz (60.9 kg)  09/28/19 135 lb 14.4 oz (61.6 kg)  09/14/19 138 lb 6.4 oz (62.8 kg)      ECOG  FS:1 - Symptomatic but completely ambulatory GENERAL: Patient is a well appearing female in no acute distress HEENT:  Sclerae anicteric.  Oropharynx clear and moist. No ulcerations or evidence of oropharyngeal candidiasis. Neck is supple.  NODES:  No cervical, supraclavicular, or axillary lymphadenopathy palpated.  BREAST EXAM:  Soft right upper outer quadrant breast mass, no progression noted. LUNGS:  Clear to auscultation bilaterally.  No wheezes or rhonchi. HEART:  Regular rate and rhythm. No murmur appreciated. ABDOMEN:  Soft, nontender.  Positive, normoactive bowel sounds. No organomegaly palpated. MSK:  No focal spinal tenderness to palpation. Limited right shoulder ROM secondary to tumor EXTREMITIES:  No peripheral edema.   SKIN:  Clear with no obvious rashes or skin changes. No nail dyscrasia. NEURO:  Nonfocal. Well oriented.  Appropriate affect.      LAB RESULTS:  CMP     Component Value Date/Time   NA 141 10/12/2019 0950   K 4.0 10/12/2019 0950   CL 107 10/12/2019 0950   CO2 24 10/12/2019 0950   GLUCOSE 98 10/12/2019 0950   BUN 10 10/12/2019 0950   CREATININE 0.67 10/12/2019 0950   CREATININE 0.79 08/04/2019 1546   CREATININE 0.76 03/27/2015 1146   CALCIUM 8.7 (L) 10/12/2019 0950   PROT 6.1 (L) 10/12/2019 0950   ALBUMIN 3.7 10/12/2019 0950   AST 13 (L) 10/12/2019 0950   AST 17 08/04/2019 1546   ALT 15 10/12/2019 0950   ALT 21 08/04/2019 1546   ALKPHOS 105 10/12/2019 0950   BILITOT 0.3 10/12/2019 0950   BILITOT 0.2 (L) 08/04/2019 1546   GFRNONAA >60 10/12/2019 0950   GFRNONAA >60 08/04/2019 1546   GFRNONAA 87 03/27/2015 1146   GFRAA >60 10/12/2019 0950   GFRAA >60 08/04/2019 1546   GFRAA >89 03/27/2015 1146    No results found for: TOTALPROTELP, ALBUMINELP, A1GS, A2GS, BETS, BETA2SER, GAMS, MSPIKE, SPEI  Lab Results  Component Value Date   WBC 9.7 10/12/2019   NEUTROABS 7.0 10/12/2019   HGB 10.6 (L) 10/12/2019   HCT 32.2 (L) 10/12/2019   MCV 89.2  10/12/2019   PLT 146 (L) 10/12/2019    No results found for: LABCA2  No components found for: LFTDDUK025 No results for input(s): INR in the last 168 hours.  No results found for: LABCA2  No results found for: CKYH062 No results found for:  JSE831  No results found for: DVV616  Lab Results  Component Value Date   CA2729 27.4 08/18/2019    No components found for: HGQUANT  No results found for: CEA1 / No results found for: CEA1   No results found for: AFPTUMOR  No results found for: CHROMOGRNA  No results found for: KPAFRELGTCHN, LAMBDASER, KAPLAMBRATIO (kappa/lambda light chains)  No results found for: HGBA, HGBA2QUANT, HGBFQUANT, HGBSQUAN (Hemoglobinopathy evaluation)   No results found for: LDH  Lab Results  Component Value Date   IRON 94 02/15/2015   TIBC 272 02/15/2015   IRONPCTSAT 35 02/15/2015   (Iron and TIBC)  Lab Results  Component Value Date   FERRITIN 70 02/15/2015    Urinalysis No results found for: COLORURINE, APPEARANCEUR, LABSPEC, PHURINE, GLUCOSEU, HGBUR, BILIRUBINUR, KETONESUR, PROTEINUR, UROBILINOGEN, NITRITE, LEUKOCYTESUR   STUDIES: MR THORACIC SPINE W WO CONTRAST  Result Date: 09/14/2019 CLINICAL DATA:  Recent diagnosis of breast cancer. Evaluate T9 lesion seen on bone scan. EXAM: MRI THORACIC WITHOUT AND WITH CONTRAST TECHNIQUE: Multiplanar and multiecho pulse sequences of the thoracic spine were obtained without and with intravenous contrast. CONTRAST:  45m GADAVIST GADOBUTROL 1 MMOL/ML IV SOLN COMPARISON:  Bone scan August 27, 2019. FINDINGS: MRI THORACIC SPINE FINDINGS Alignment:  Physiologic. Vertebrae: No fracture, evidence of discitis, or bone lesion. There is mild diffuse decrease of the T1 signal throughout the vertebral bodies, may be related to anemia. No focal bone lesion identified at T9 . There is also no suspicious focal bone lesion or abnormal contrast enhancement at any other thoracic spine level. Are Cord:  Normal signal  and morphology. Paraspinal and other soft tissues: Negative. Disc levels: No significant disc bulge or herniation, spinal canal or neural foraminal stenosis seen at any level. Mild facet degenerative changes are seen at T9-T10 and T10-T11. IMPRESSION: 1. No evidence of metastatic disease of the thoracic spine. 2. Mild degenerative facet disease at T9-T10 and T10-T11. Electronically Signed   By: KPedro EarlsM.D.   On: 09/14/2019 08:38    ELIGIBLE FOR AVAILABLE RESEARCH PROTOCOL:   ASSESSMENT: 64y.o. [place] status post right breast biopsy x2 on 07/13/2019 for a clinical T3-T4 N1-2, stage III invasive ductal carcinoma, grade 2, estrogen receptor positive, progesterone receptor variable, HER-2 not amplified.  (a) breast MRI 08/15/2019 showed a 7.6 area of enhancement, and 5 suspicious right axillary lymph nodes as well as two 0.5 mm additional masses in the inferior right breast   (1) neoadjuvant chemotherapy consisting of cyclophosphamide and doxorubicin in dose dense fashion x4 starting 08/18/2019, to be followed by weekly paclitaxel x12  (2) definitive surgery (right modified radical mastectomy) to follow  (3) adjuvant radiation as appropriate  (4) antiestrogens to start at the completion of local treatment   PLAN: MBritaniais ready to proceed with her next chemotherapy regimen which is weekly Paclitaxel x 12.  Her labs are stable (CMET pending), and she understands that this will be weekly.  I reviewed with her common side effects, including peripheral neuroapthy, practicing cryotherapy, and I let her know that her fatigue should improve.  I also reviewed how to take her anti nausea medications, and removed the dexamethasone from her list.    MTylantumor is softer, however still palpable.  I have placed an order for an ultrasound to get a new baseline that we can follow and to see how far we have come.  I sent DBary Castillaa message to help operationalize this,  as MDewitt Hoes  wants this to be completed at the breast center and not at [facility] where her initial breast imaging was completed.    Carnelia and I reviewed her treatment plan, and that because of the size and location of the tumor, her surgery would be less extensive and have a better chance at negative margins with chemotherapy up front to shrink the tumor.  I also reviewed with her that there is no reliable tumor marker that is recommended at this point for breast cancer.  She understands that.    Melayah and I talked about her activity level and treatment plan.  She will not receive chemotherapy next week because of her covid vaccine.  I recommended she receive this in her left deltoid instead of the right, because there is a small risk of lymphadenopathy that we do want to avoid in the right arm, because it may cause anxiety for her.    We will see her back in 1 week for labs and f/u.  She was recommended to continue with the appropriate pandemic precautions. She knows to call for any questions that may arise between now and her next appointment.  We are happy to see her sooner if needed.  Total encounter time 30 minutes.Wilber Bihari, NP 10/12/19 10:49 AM Medical Oncology and Hematology [facility] [place], [place] [address] Tel. [phone]    Fax. [phone]   *Total Encounter Time as defined by the Centers for Medicare and Medicaid Services includes, in addition to the face-to-face time of a patient visit (documented in the note above) non-face-to-face time: obtaining and reviewing outside history, ordering and reviewing medications, tests or procedures, care coordination (communications with other health care professionals or caregivers) and documentation in the medical record.

## 2019-10-12 ENCOUNTER — Inpatient Hospital Stay: Payer: Managed Care, Other (non HMO) | Admitting: Adult Health

## 2019-10-12 ENCOUNTER — Inpatient Hospital Stay: Payer: Managed Care, Other (non HMO)

## 2019-10-12 ENCOUNTER — Encounter: Payer: Self-pay | Admitting: Adult Health

## 2019-10-12 ENCOUNTER — Other Ambulatory Visit: Payer: Self-pay

## 2019-10-12 ENCOUNTER — Encounter: Payer: Self-pay | Admitting: *Deleted

## 2019-10-12 VITALS — BP 127/62 | HR 73 | Temp 98.1°F | Resp 18

## 2019-10-12 VITALS — BP 111/46 | HR 69 | Temp 98.2°F | Resp 20 | Ht 64.5 in | Wt 134.2 lb

## 2019-10-12 DIAGNOSIS — Z170 Estrogen receptor positive status [ER+]: Secondary | ICD-10-CM | POA: Diagnosis not present

## 2019-10-12 DIAGNOSIS — C50411 Malignant neoplasm of upper-outer quadrant of right female breast: Secondary | ICD-10-CM | POA: Diagnosis not present

## 2019-10-12 DIAGNOSIS — Z5111 Encounter for antineoplastic chemotherapy: Secondary | ICD-10-CM | POA: Diagnosis not present

## 2019-10-12 LAB — CBC WITH DIFFERENTIAL/PLATELET
Abs Immature Granulocytes: 0.7 10*3/uL — ABNORMAL HIGH (ref 0.00–0.07)
Basophils Absolute: 0.2 10*3/uL — ABNORMAL HIGH (ref 0.0–0.1)
Basophils Relative: 2 %
Eosinophils Absolute: 0.1 10*3/uL (ref 0.0–0.5)
Eosinophils Relative: 1 %
HCT: 32.2 % — ABNORMAL LOW (ref 36.0–46.0)
Hemoglobin: 10.6 g/dL — ABNORMAL LOW (ref 12.0–15.0)
Immature Granulocytes: 7 %
Lymphocytes Relative: 12 %
Lymphs Abs: 1.2 10*3/uL (ref 0.7–4.0)
MCH: 29.4 pg (ref 26.0–34.0)
MCHC: 32.9 g/dL (ref 30.0–36.0)
MCV: 89.2 fL (ref 80.0–100.0)
Monocytes Absolute: 0.6 10*3/uL (ref 0.1–1.0)
Monocytes Relative: 7 %
Neutro Abs: 7 10*3/uL (ref 1.7–7.7)
Neutrophils Relative %: 71 %
Platelets: 146 10*3/uL — ABNORMAL LOW (ref 150–400)
RBC: 3.61 MIL/uL — ABNORMAL LOW (ref 3.87–5.11)
RDW: 17.5 % — ABNORMAL HIGH (ref 11.5–15.5)
WBC: 9.7 10*3/uL (ref 4.0–10.5)
nRBC: 0.2 % (ref 0.0–0.2)

## 2019-10-12 LAB — COMPREHENSIVE METABOLIC PANEL
ALT: 15 U/L (ref 0–44)
AST: 13 U/L — ABNORMAL LOW (ref 15–41)
Albumin: 3.7 g/dL (ref 3.5–5.0)
Alkaline Phosphatase: 105 U/L (ref 38–126)
Anion gap: 10 (ref 5–15)
BUN: 10 mg/dL (ref 8–23)
CO2: 24 mmol/L (ref 22–32)
Calcium: 8.7 mg/dL — ABNORMAL LOW (ref 8.9–10.3)
Chloride: 107 mmol/L (ref 98–111)
Creatinine, Ser: 0.67 mg/dL (ref 0.44–1.00)
GFR calc Af Amer: >60 mL/min (ref >60)
GFR calc non Af Amer: >60 mL/min (ref >60)
Glucose, Bld: 98 mg/dL (ref 70–99)
Potassium: 4 mmol/L (ref 3.5–5.1)
Sodium: 141 mmol/L (ref 135–145)
Total Bilirubin: 0.3 mg/dL (ref 0.3–1.2)
Total Protein: 6.1 g/dL — ABNORMAL LOW (ref 6.5–8.1)

## 2019-10-12 MED ORDER — DIPHENHYDRAMINE HCL 50 MG/ML IJ SOLN
INTRAMUSCULAR | Status: AC
  Filled 2019-10-12: qty 1

## 2019-10-12 MED ORDER — SODIUM CHLORIDE 0.9 % IV SOLN
80.0000 mg/m2 | Freq: Once | INTRAVENOUS | Status: AC
  Administered 2019-10-12: 138 mg via INTRAVENOUS
  Filled 2019-10-12: qty 23

## 2019-10-12 MED ORDER — SODIUM CHLORIDE 0.9% FLUSH
10.0000 mL | INTRAVENOUS | Status: DC | PRN
  Administered 2019-10-12: 10 mL
  Filled 2019-10-12: qty 10

## 2019-10-12 MED ORDER — FAMOTIDINE IN NACL 20-0.9 MG/50ML-% IV SOLN
INTRAVENOUS | Status: AC
  Filled 2019-10-12: qty 50

## 2019-10-12 MED ORDER — HEPARIN SOD (PORK) LOCK FLUSH 100 UNIT/ML IV SOLN
500.0000 [IU] | Freq: Once | INTRAVENOUS | Status: AC | PRN
  Administered 2019-10-12: 500 [IU]
  Filled 2019-10-12: qty 5

## 2019-10-12 MED ORDER — DEXAMETHASONE SODIUM PHOSPHATE 10 MG/ML IJ SOLN
INTRAMUSCULAR | Status: AC
  Filled 2019-10-12: qty 1

## 2019-10-12 MED ORDER — DEXAMETHASONE SODIUM PHOSPHATE 10 MG/ML IJ SOLN
10.0000 mg | Freq: Once | INTRAMUSCULAR | Status: AC
  Administered 2019-10-12: 10 mg via INTRAVENOUS

## 2019-10-12 MED ORDER — SODIUM CHLORIDE 0.9 % IV SOLN
Freq: Once | INTRAVENOUS | Status: AC
  Filled 2019-10-12: qty 250

## 2019-10-12 MED ORDER — FAMOTIDINE IN NACL 20-0.9 MG/50ML-% IV SOLN
20.0000 mg | Freq: Once | INTRAVENOUS | Status: AC
  Administered 2019-10-12: 20 mg via INTRAVENOUS

## 2019-10-12 MED ORDER — DIPHENHYDRAMINE HCL 50 MG/ML IJ SOLN
25.0000 mg | Freq: Once | INTRAMUSCULAR | Status: AC
  Administered 2019-10-12: 25 mg via INTRAVENOUS

## 2019-10-12 NOTE — Patient Instructions (Signed)
[facility] Discharge Instructions for Patients Receiving Chemotherapy  Today you received the following chemotherapy agents: paclitaxel.  To help prevent nausea and vomiting after your treatment, we encourage you to take your nausea medication as directed.   If you develop nausea and vomiting that is not controlled by your nausea medication, call the clinic.   BELOW ARE SYMPTOMS THAT SHOULD BE REPORTED IMMEDIATELY:  *FEVER GREATER THAN 100.5 F  *CHILLS WITH OR WITHOUT FEVER  NAUSEA AND VOMITING THAT IS NOT CONTROLLED WITH YOUR NAUSEA MEDICATION  *UNUSUAL SHORTNESS OF BREATH  *UNUSUAL BRUISING OR BLEEDING  TENDERNESS IN MOUTH AND THROAT WITH OR WITHOUT PRESENCE OF ULCERS  *URINARY PROBLEMS  *BOWEL PROBLEMS  UNUSUAL RASH Items with * indicate a potential emergency and should be followed up as soon as possible.  Feel free to call the clinic should you have any questions or concerns. The clinic phone number is [phone].  Please show the CHEMO ALERT CARD at check-in to the Emergency Department and triage nurse.  Paclitaxel injection What is this medicine? PACLITAXEL (PAK li TAX el) is a chemotherapy drug. It targets fast dividing cells, like cancer cells, and causes these cells to die. This medicine is used to treat ovarian cancer, breast cancer, lung cancer, Kaposi's sarcoma, and other cancers. This medicine may be used for other purposes; ask your health care provider or pharmacist if you have questions. COMMON BRAND NAME(S): Onxol, Taxol What should I tell my health care provider before I take this medicine? They need to know if you have any of these conditions:  history of irregular heartbeat  liver disease  low blood counts, like low white cell, platelet, or red cell counts  lung or breathing disease, like asthma  tingling of the fingers or toes, or other nerve disorder  an unusual or allergic reaction to paclitaxel, alcohol, polyoxyethylated  castor oil, other chemotherapy, other medicines, foods, dyes, or preservatives  pregnant or trying to get pregnant  breast-feeding How should I use this medicine? This drug is given as an infusion into a vein. It is administered in a hospital or clinic by a specially trained health care professional. Talk to your pediatrician regarding the use of this medicine in children. Special care may be needed. Overdosage: If you think you have taken too much of this medicine contact a poison control center or emergency room at once. NOTE: This medicine is only for you. Do not share this medicine with others. What if I miss a dose? It is important not to miss your dose. Call your doctor or health care professional if you are unable to keep an appointment. What may interact with this medicine? Do not take this medicine with any of the following medications:  disulfiram  metronidazole This medicine may also interact with the following medications:  antiviral medicines for hepatitis, HIV or AIDS  certain antibiotics like erythromycin and clarithromycin  certain medicines for fungal infections like ketoconazole and itraconazole  certain medicines for seizures like carbamazepine, phenobarbital, phenytoin  gemfibrozil  nefazodone  rifampin  St. John's wort This list may not describe all possible interactions. Give your health care provider a list of all the medicines, herbs, non-prescription drugs, or dietary supplements you use. Also tell them if you smoke, drink alcohol, or use illegal drugs. Some items may interact with your medicine. What should I watch for while using this medicine? Your condition will be monitored carefully while you are receiving this medicine. You will need important blood work   done while you are taking this medicine. This medicine can cause serious allergic reactions. To reduce your risk you will need to take other medicine(s) before treatment with this medicine. If you  experience allergic reactions like skin rash, itching or hives, swelling of the face, lips, or tongue, tell your doctor or health care professional right away. In some cases, you may be given additional medicines to help with side effects. Follow all directions for their use. This drug may make you feel generally unwell. This is not uncommon, as chemotherapy can affect healthy cells as well as cancer cells. Report any side effects. Continue your course of treatment even though you feel ill unless your doctor tells you to stop. Call your doctor or health care professional for advice if you get a fever, chills or sore throat, or other symptoms of a cold or flu. Do not treat yourself. This drug decreases your body's ability to fight infections. Try to avoid being around people who are sick. This medicine may increase your risk to bruise or bleed. Call your doctor or health care professional if you notice any unusual bleeding. Be careful brushing and flossing your teeth or using a toothpick because you may get an infection or bleed more easily. If you have any dental work done, tell your dentist you are receiving this medicine. Avoid taking products that contain aspirin, acetaminophen, ibuprofen, naproxen, or ketoprofen unless instructed by your doctor. These medicines may hide a fever. Do not become pregnant while taking this medicine. Women should inform their doctor if they wish to become pregnant or think they might be pregnant. There is a potential for serious side effects to an unborn child. Talk to your health care professional or pharmacist for more information. Do not breast-feed an infant while taking this medicine. Men are advised not to father a child while receiving this medicine. This product may contain alcohol. Ask your pharmacist or healthcare provider if this medicine contains alcohol. Be sure to tell all healthcare providers you are taking this medicine. Certain medicines, like metronidazole  and disulfiram, can cause an unpleasant reaction when taken with alcohol. The reaction includes flushing, headache, nausea, vomiting, sweating, and increased thirst. The reaction can last from 30 minutes to several hours. What side effects may I notice from receiving this medicine? Side effects that you should report to your doctor or health care professional as soon as possible:  allergic reactions like skin rash, itching or hives, swelling of the face, lips, or tongue  breathing problems  changes in vision  fast, irregular heartbeat  high or low blood pressure  mouth sores  pain, tingling, numbness in the hands or feet  signs of decreased platelets or bleeding - bruising, pinpoint red spots on the skin, black, tarry stools, blood in the urine  signs of decreased red blood cells - unusually weak or tired, feeling faint or lightheaded, falls  signs of infection - fever or chills, cough, sore throat, pain or difficulty passing urine  signs and symptoms of liver injury like dark yellow or brown urine; general ill feeling or flu-like symptoms; light-colored stools; loss of appetite; nausea; right upper belly pain; unusually weak or tired; yellowing of the eyes or skin  swelling of the ankles, feet, hands  unusually slow heartbeat Side effects that usually do not require medical attention (report to your doctor or health care professional if they continue or are bothersome):  diarrhea  hair loss  loss of appetite  muscle or joint pain    nausea, vomiting  pain, redness, or irritation at site where injected  tiredness This list may not describe all possible side effects. Call your doctor for medical advice about side effects. You may report side effects to FDA at 1-800-FDA-1088. Where should I keep my medicine? This drug is given in a hospital or clinic and will not be stored at home. NOTE: This sheet is a summary. It may not cover all possible information. If you have  questions about this medicine, talk to your doctor, pharmacist, or health care provider.  2020 Elsevier/Gold Standard (2017-03-18 13:14:55)    

## 2019-10-13 ENCOUNTER — Telehealth: Payer: Self-pay | Admitting: Adult Health

## 2019-10-13 NOTE — Telephone Encounter (Signed)
Rescheduled appts to accommodate for added appts per 3/16 los. Pt is aware of new appt times.

## 2019-10-18 ENCOUNTER — Telehealth: Payer: Self-pay | Admitting: *Deleted

## 2019-10-18 NOTE — Telephone Encounter (Signed)
Called pt to discuss scheduling breast US. Pt relate she was unable to talk when [facility] called her last week and she will call them today to schedule her appt.

## 2019-10-19 ENCOUNTER — Ambulatory Visit: Payer: Managed Care, Other (non HMO) | Admitting: Oncology

## 2019-10-19 ENCOUNTER — Other Ambulatory Visit: Payer: Managed Care, Other (non HMO)

## 2019-10-19 ENCOUNTER — Ambulatory Visit: Payer: Managed Care, Other (non HMO)

## 2019-10-20 NOTE — Addendum Note (Signed)
Addended by: Tora Kindred on: 10/20/2019 03:37 PM   Modules accepted: Orders

## 2019-10-20 NOTE — Progress Notes (Signed)
Pharmacist Chemotherapy Monitoring - Follow Up Assessment    I verify that I have reviewed each item in the below checklist:  . Regimen for the patient is scheduled for the appropriate day and plan matches scheduled date. Marland Kitchen Appropriate non-routine labs are ordered dependent on drug ordered. . If applicable, additional medications reviewed and ordered per protocol based on lifetime cumulative doses and/or treatment regimen.   Plan for follow-up and/or issues identified: No . I-vent associated with next due treatment: No . MD and/or nursing notified: No    Kennith Center, Pharm.D., CPP 10/20/2019@3 :37 PM

## 2019-10-26 ENCOUNTER — Inpatient Hospital Stay: Payer: Managed Care, Other (non HMO)

## 2019-10-26 ENCOUNTER — Other Ambulatory Visit: Payer: Self-pay

## 2019-10-26 ENCOUNTER — Ambulatory Visit (HOSPITAL_BASED_OUTPATIENT_CLINIC_OR_DEPARTMENT_OTHER): Payer: Managed Care, Other (non HMO) | Admitting: Medical

## 2019-10-26 VITALS — BP 119/68 | HR 72 | Temp 98.2°F | Resp 16

## 2019-10-26 DIAGNOSIS — C50411 Malignant neoplasm of upper-outer quadrant of right female breast: Secondary | ICD-10-CM

## 2019-10-26 DIAGNOSIS — Z5111 Encounter for antineoplastic chemotherapy: Secondary | ICD-10-CM | POA: Diagnosis not present

## 2019-10-26 DIAGNOSIS — Z170 Estrogen receptor positive status [ER+]: Secondary | ICD-10-CM

## 2019-10-26 DIAGNOSIS — Z189 Retained foreign body fragments, unspecified material: Secondary | ICD-10-CM

## 2019-10-26 LAB — CBC WITH DIFFERENTIAL/PLATELET
Abs Immature Granulocytes: 0.01 10*3/uL (ref 0.00–0.07)
Basophils Absolute: 0.1 10*3/uL (ref 0.0–0.1)
Basophils Relative: 2 %
Eosinophils Absolute: 0.2 10*3/uL (ref 0.0–0.5)
Eosinophils Relative: 4 %
HCT: 31.6 % — ABNORMAL LOW (ref 36.0–46.0)
Hemoglobin: 10.4 g/dL — ABNORMAL LOW (ref 12.0–15.0)
Immature Granulocytes: 0 %
Lymphocytes Relative: 18 %
Lymphs Abs: 0.8 10*3/uL (ref 0.7–4.0)
MCH: 29.5 pg (ref 26.0–34.0)
MCHC: 32.9 g/dL (ref 30.0–36.0)
MCV: 89.8 fL (ref 80.0–100.0)
Monocytes Absolute: 0.5 10*3/uL (ref 0.1–1.0)
Monocytes Relative: 11 %
Neutro Abs: 3 10*3/uL (ref 1.7–7.7)
Neutrophils Relative %: 65 %
Platelets: 209 10*3/uL (ref 150–400)
RBC: 3.52 MIL/uL — ABNORMAL LOW (ref 3.87–5.11)
RDW: 16.8 % — ABNORMAL HIGH (ref 11.5–15.5)
WBC: 4.5 10*3/uL (ref 4.0–10.5)
nRBC: 0 % (ref 0.0–0.2)

## 2019-10-26 LAB — COMPREHENSIVE METABOLIC PANEL
ALT: 29 U/L (ref 0–44)
AST: 23 U/L (ref 15–41)
Albumin: 3.7 g/dL (ref 3.5–5.0)
Alkaline Phosphatase: 72 U/L (ref 38–126)
Anion gap: 8 (ref 5–15)
BUN: 12 mg/dL (ref 8–23)
CO2: 23 mmol/L (ref 22–32)
Calcium: 8.6 mg/dL — ABNORMAL LOW (ref 8.9–10.3)
Chloride: 107 mmol/L (ref 98–111)
Creatinine, Ser: 0.68 mg/dL (ref 0.44–1.00)
GFR calc Af Amer: >60 mL/min (ref >60)
GFR calc non Af Amer: >60 mL/min (ref >60)
Glucose, Bld: 96 mg/dL (ref 70–99)
Potassium: 3.9 mmol/L (ref 3.5–5.1)
Sodium: 138 mmol/L (ref 135–145)
Total Bilirubin: 0.4 mg/dL (ref 0.3–1.2)
Total Protein: 6.3 g/dL — ABNORMAL LOW (ref 6.5–8.1)

## 2019-10-26 MED ORDER — HEPARIN SOD (PORK) LOCK FLUSH 100 UNIT/ML IV SOLN
500.0000 [IU] | Freq: Once | INTRAVENOUS | Status: AC | PRN
  Administered 2019-10-26: 16:00:00 500 [IU]
  Filled 2019-10-26: qty 5

## 2019-10-26 MED ORDER — FAMOTIDINE IN NACL 20-0.9 MG/50ML-% IV SOLN
INTRAVENOUS | Status: AC
  Filled 2019-10-26: qty 50

## 2019-10-26 MED ORDER — SODIUM CHLORIDE 0.9% FLUSH
10.0000 mL | INTRAVENOUS | Status: DC | PRN
  Administered 2019-10-26 (×2): 10 mL
  Filled 2019-10-26: qty 10

## 2019-10-26 MED ORDER — SODIUM CHLORIDE 0.9 % IV SOLN
Freq: Once | INTRAVENOUS | Status: AC
  Filled 2019-10-26: qty 250

## 2019-10-26 MED ORDER — SODIUM CHLORIDE 0.9 % IV SOLN
80.0000 mg/m2 | Freq: Once | INTRAVENOUS | Status: AC
  Administered 2019-10-26: 15:00:00 138 mg via INTRAVENOUS
  Filled 2019-10-26: qty 23

## 2019-10-26 MED ORDER — DEXAMETHASONE SODIUM PHOSPHATE 10 MG/ML IJ SOLN
10.0000 mg | Freq: Once | INTRAMUSCULAR | Status: AC
  Administered 2019-10-26: 10 mg via INTRAVENOUS

## 2019-10-26 MED ORDER — DIPHENHYDRAMINE HCL 50 MG/ML IJ SOLN
INTRAMUSCULAR | Status: AC
  Filled 2019-10-26: qty 1

## 2019-10-26 MED ORDER — FAMOTIDINE IN NACL 20-0.9 MG/50ML-% IV SOLN
20.0000 mg | Freq: Once | INTRAVENOUS | Status: AC
  Administered 2019-10-26: 14:00:00 20 mg via INTRAVENOUS

## 2019-10-26 MED ORDER — DEXAMETHASONE SODIUM PHOSPHATE 10 MG/ML IJ SOLN
INTRAMUSCULAR | Status: AC
  Filled 2019-10-26: qty 1

## 2019-10-26 MED ORDER — DIPHENHYDRAMINE HCL 50 MG/ML IJ SOLN
25.0000 mg | Freq: Once | INTRAMUSCULAR | Status: AC
  Administered 2019-10-26: 25 mg via INTRAVENOUS

## 2019-10-26 MED ORDER — SODIUM CHLORIDE 0.9% FLUSH
10.0000 mL | INTRAVENOUS | Status: DC | PRN
  Filled 2019-10-26: qty 10

## 2019-10-26 NOTE — Patient Instructions (Signed)
[facility] Discharge Instructions for Patients Receiving Chemotherapy  Today you received the following chemotherapy agents: paclitaxel.  To help prevent nausea and vomiting after your treatment, we encourage you to take your nausea medication as directed.   If you develop nausea and vomiting that is not controlled by your nausea medication, call the clinic.   BELOW ARE SYMPTOMS THAT SHOULD BE REPORTED IMMEDIATELY:  *FEVER GREATER THAN 100.5 F  *CHILLS WITH OR WITHOUT FEVER  NAUSEA AND VOMITING THAT IS NOT CONTROLLED WITH YOUR NAUSEA MEDICATION  *UNUSUAL SHORTNESS OF BREATH  *UNUSUAL BRUISING OR BLEEDING  TENDERNESS IN MOUTH AND THROAT WITH OR WITHOUT PRESENCE OF ULCERS  *URINARY PROBLEMS  *BOWEL PROBLEMS  UNUSUAL RASH Items with * indicate a potential emergency and should be followed up as soon as possible.  Feel free to call the clinic should you have any questions or concerns. The clinic phone number is [phone].  Please show the CHEMO ALERT CARD at check-in to the Emergency Department and triage nurse.   

## 2019-10-27 NOTE — Progress Notes (Signed)
The patient was seen in the infusion room today after she had received her treatment.  It was noted she had a retained dissolvable suture which was beginning to poke through the medial end of her port insertion site.  Using sterile technique the end of the suture was grasped with a pair of forceps but could not be pulled further out in order to cut it closer to the level of the skin.  The area was covered with Band-Aid.  The patient was instructed to use a pain forceps at home to attempt to pull the suture out and clipped close to the surface of the skin in if it appeared that the suture was starting to come out.  She expressed understanding and agreement with this plan.  Sandi Mealy, MHS, PA-C Physician Assistant

## 2019-10-27 NOTE — Progress Notes (Signed)
Pharmacist Chemotherapy Monitoring - Follow Up Assessment    I verify that I have reviewed each item in the below checklist:  . Regimen for the patient is scheduled for the appropriate day and plan matches scheduled date. Marland Kitchen Appropriate non-routine labs are ordered dependent on drug ordered. . If applicable, additional medications reviewed and ordered per protocol based on lifetime cumulative doses and/or treatment regimen.   Plan for follow-up and/or issues identified: No . I-vent associated with next due treatment: No . MD and/or nursing notified: No  Keeshia Sanderlin K 10/27/2019 10:04 AM

## 2019-10-31 NOTE — Progress Notes (Signed)
[facility]  [phone] [phone]     ID: Paige Gilbert DOB: 1956/03/15  MR#: [id number]  VQX#:[id number]  Patient Care Team: Paige Booze, NP as PCP - General (Family Medicine) Paige Kaufmann, RN as Oncology Nurse Navigator Paige Germany, RN as Oncology Nurse Navigator Paige Gilbert, Paige Dad, MD as Consulting Physician (Oncology) [name], MD as Attending Physician (Radiation Oncology) [name], MD as Consulting Physician (General Surgery) [name], MD OTHER MD:  CHIEF COMPLAINT: Locally advanced estrogen receptor positive breast cancer  CURRENT TREATMENT: Neoadjuvant chemotherapy   INTERVAL HISTORY: Paige Gilbert returns today for follow up and treatment of her locally advanced breast cancer.  She completed her first 4 cycles of intensive doxorubicin and cyclophosphamide.  She had some toxicities carryover so that her first Taxol cycle made her sick she says and she had some soreness in her mouth.  She did better with her second cycle of Taxol, last week, with no nausea or vomiting, and more energy.    Today is week 3 of 12 planned treatments.   She underwent right breast ultrasound yesterday 11/01/2019..  This shows a good initial response to chemotherapy, approximately 40% reduction.   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  She has no peripheral neuropathy symptoms.  Aside from this a detailed review of systems today was stable   HISTORY OF CURRENT ILLNESS: From the original intake note:  Paige Gilbert has noted a difference between her right and left breast for several years.  She last had a mammogram she thinks about 7 or 8 years ago.  More recently it seemed to her that her right breast was becoming more firm.  She brought it to medical attention and underwent bilateral diagnostic mammography with tomography and right breast ultrasonography at  [facility] on 07/07/2019 showing: heterogeneously dense breast tissue; right nipple retraction; architectural distortion with microcalcifications and irregular dense tissue in the upper-outer quadrant; at least 3 abnormal right axillary lymph nodes.  Accordingly on 07/13/2019 she proceeded to biopsy of the right breast area in question. The pathology from this procedure ([id number]) showed:   1. Right Breast, upper-outer quadrant, closer to the nipple  - invasive ductal carcinoma with micropapillary features, grade 2  - ductal carcinoma in situ, grade 2-3, solid and cribriform pattern with comedonecrosis  - Prognostic indicators significant for: estrogen receptor, 100% positive with strong staining intensity and progesterone receptor, 0% negative. HER2 equivocal by immunohistochemistry (2+), but negative by fluorescent in situ hybridization with a signals ratio 1.5 and number per cell 3.8.  2. Right Breast, upper-outer quadrant, axillary tail  - invasive ductal carcinoma, grade 2  - ductal carcinoma in situ, grade 2-3  - Prognostic indicators significant for: estrogen receptor, 100% positive and progesterone receptor, 50% positive, both with strong staining intensity. HER2 equivocal by immunohistochemistry (2+), but negative by fluorescent in situ hybridization with a signals ratio 1.9 and number per cell 5.3.  The patient's subsequent history is as detailed below.   PAST MEDICAL HISTORY: Past Medical History:  Diagnosis Date  . Anemia   . right breast ca dx'd 06/2019  . Varicose vein   . Varicose veins of left lower extremity    "bluging ones at top- no pain"    PAST SURGICAL HISTORY: Past Surgical History:  Procedure Laterality Date  . NO PAST SURGERIES    . none    . PORTACATH PLACEMENT Right 08/17/2019  Procedure: INSERTION PORT-A-CATH WITH ULTRASOUND;  Surgeon: [name], MD;  Location: [facility];  Service: General;  Laterality: Right;  . wisdom teeth removal     remotely.      FAMILY HISTORY: Family History  Problem Relation Age of Onset  . Diabetes Father   . Blindness Mother   . Congestive Heart Failure Sister   . Gallbladder disease Sister        removed  . Diabetes Sister   . Colitis Sister    The patient's father died at age 86 from a stroke.  The patient's mother died from multiple medical problems not related to cancer at age 85.  The patient had 4 brothers, 7 sisters.  There is no breast ovarian or prostate cancer in the family to her knowledge.  She had one cousin with pancreatic cancer.   GYNECOLOGIC HISTORY:  No LMP recorded. Patient is postmenopausal. Menarche: 64 years old Age at first live birth: 64 years old [place] P 5 LMP 53 Contraceptive: Brief use of oral contraceptives as a teenager HRT no Hysterectomy?  No BSO?    SOCIAL HISTORY: (updated 07/2019)  [name] works in a Engineer, agricultural, and Education administrator houses.  Her husband of 45 years Paige Gilbert is a Chief Operating Officer.  Their children are Paige Gilbert, 2, who lives in [place] and is a Veterinary surgeon in the [employer]; Paige Gilbert 33 who works as a Designer, jewellery at [employer]; Paige Gilbert who lives in [place], and is not currently employed; Paige Gilbert lives in [place], and Paige Gilbert who is an Armed forces training and education officer in [place].  The patient has 4 grandchildren all under the age of 50    ADVANCED DIRECTIVES: Husband Paige Gilbert is her HCPOA.   HEALTH MAINTENANCE: Social History   Tobacco Use  . Smoking status: Never Smoker  . Smokeless tobacco: Never Used  Substance Use Topics  . Alcohol use: Yes    Comment: rarely  . Drug use: No     Colonoscopy: never done  PAP: Remote  Bone density: Never   No Known Allergies  Current Outpatient Medications  Medication Sig Dispense Refill  . Calcium Citrate-Vitamin D (CALCIUM CITRATE + D PO) Take 1 tablet by mouth 2 (two) times daily.     Marland Kitchen ibuprofen (ADVIL) 800 MG tablet Take 1 tablet (800 mg total) by mouth every 8 (eight) hours as  needed. 30 tablet 0  . lidocaine-prilocaine (EMLA) cream Apply to affected area once 30 g 3  . LORazepam (ATIVAN) 0.5 MG tablet Take 1 tablet (0.5 mg total) by mouth at bedtime as needed (Nausea or vomiting). 30 tablet 0  . oxyCODONE (OXY IR/ROXICODONE) 5 MG immediate release tablet     . prochlorperazine (COMPAZINE) 10 MG tablet Take 1 tablet (10 mg total) by mouth every 6 (six) hours as needed (Nausea or vomiting). 30 tablet 1   No current facility-administered medications for this visit.   Facility-Administered Medications Ordered in Other Visits  Medication Dose Route Frequency Provider Last Rate Last Admin  . sodium chloride flush (NS) 0.9 % injection 10 mL  10 mL Intracatheter PRN Jequan Shahin, Paige Dad, MD   10 mL at 11/02/19 1446    OBJECTIVE:  white woman who appears younger than stated age 64:   11/02/19 1113  BP: 110/66  Pulse: 70  Resp: 17  Temp: 98 F (36.7 C)  SpO2: 100%     Body mass index is 23 kg/m.   Wt Readings from Last 3 Encounters:  11/02/19 136 lb 1.6 oz (  61.7 kg)  10/12/19 134 lb 3.2 oz (60.9 kg)  09/28/19 135 lb 14.4 oz (61.6 kg)      ECOG FS:1 - Symptomatic but completely ambulatory  Sclerae unicteric, EOMs intact Wearing a mask No cervical or supraclavicular adenopathy Lungs no rales or rhonchi Heart regular rate and rhythm Abd soft, nontender, positive bowel sounds MSK no focal spinal tenderness, no upper extremity lymphedema Neuro: nonfocal, well oriented, appropriate affect Breasts: The right breast does show some tethering in the upper outer quadrant but the mass appears more movable and softer.  The left breast is benign.  Both axillae are benign.   LAB RESULTS:  CMP     Component Value Date/Time   NA 139 11/02/2019 1059   K 4.0 11/02/2019 1059   CL 108 11/02/2019 1059   CO2 24 11/02/2019 1059   GLUCOSE 93 11/02/2019 1059   BUN 11 11/02/2019 1059   CREATININE 0.65 11/02/2019 1059   CREATININE 0.79 08/04/2019 1546   CREATININE  0.76 03/27/2015 1146   CALCIUM 8.6 (L) 11/02/2019 1059   PROT 6.1 (L) 11/02/2019 1059   ALBUMIN 3.5 11/02/2019 1059   AST 25 11/02/2019 1059   AST 17 08/04/2019 1546   ALT 37 11/02/2019 1059   ALT 21 08/04/2019 1546   ALKPHOS 69 11/02/2019 1059   BILITOT 0.3 11/02/2019 1059   BILITOT 0.2 (L) 08/04/2019 1546   GFRNONAA >60 11/02/2019 1059   GFRNONAA >60 08/04/2019 1546   GFRNONAA 87 03/27/2015 1146   GFRAA >60 11/02/2019 1059   GFRAA >60 08/04/2019 1546   GFRAA >89 03/27/2015 1146    No results found for: TOTALPROTELP, ALBUMINELP, A1GS, A2GS, BETS, BETA2SER, GAMS, MSPIKE, SPEI  Lab Results  Component Value Date   WBC 3.6 (L) 11/02/2019   NEUTROABS 2.1 11/02/2019   HGB 10.3 (L) 11/02/2019   HCT 31.4 (L) 11/02/2019   MCV 91.0 11/02/2019   PLT 167 11/02/2019    No results found for: LABCA2  No components found for: LNLGXQ119  No results for input(s): INR in the last 168 hours.  No results found for: LABCA2  No results found for: CAN199  No results found for: ERD408  No results found for: XKG818  Lab Results  Component Value Date   CA2729 27.4 08/18/2019    No components found for: HGQUANT  No results found for: CEA1 / No results found for: CEA1   No results found for: AFPTUMOR  No results found for: CHROMOGRNA  No results found for: KPAFRELGTCHN, LAMBDASER, KAPLAMBRATIO (kappa/lambda light chains)  No results found for: HGBA, HGBA2QUANT, HGBFQUANT, HGBSQUAN (Hemoglobinopathy evaluation)   No results found for: LDH  Lab Results  Component Value Date   IRON 94 02/15/2015   TIBC 272 02/15/2015   IRONPCTSAT 35 02/15/2015   (Iron and TIBC)  Lab Results  Component Value Date   FERRITIN 70 02/15/2015    Urinalysis No results found for: COLORURINE, APPEARANCEUR, LABSPEC, PHURINE, GLUCOSEU, HGBUR, BILIRUBINUR, KETONESUR, PROTEINUR, UROBILINOGEN, NITRITE, LEUKOCYTESUR   STUDIES: US BREAST LTD UNI RIGHT INC AXILLA  Result Date: 11/01/2019 CLINICAL  DATA:  Follow-up right breast cancer and metastatic adenopathy treated with neoadjuvant chemotherapy. EXAM: ULTRASOUND OF THE RIGHT BREAST COMPARISON:  Previous right breast and axillary ultrasound performed at [facility] in [place], [place] on 07/07/2019. FINDINGS: Targeted ultrasound is performed and again demonstrates irregular hypoechoic areas extending from the right nipple to the right axilla with approximately 4 right axillary lymph nodes with abnormal cortical thickening. The cortical thickening  has improved. Previously, the largest node had a maximum cortical thickness of 6.4 mm and currently has a maximum cortical thickness of 3.5 mm. A previously measured 4.5 x 3.0 x 1.7 cm irregular hypoechoic area in the 10 o'clock position of the right breast, 7 cm from the nipple, currently measures 4.5 x 2.6 x 0.9 cm. This corresponds to a palpable mass today and previously. A previously measured 3.3 x 2.2 x 1.6 cm irregular hypoechoic area in the right breast in the 12 o'clock position, 1 cm from the nipple currently measures 1.5 x 1.1 x 0.9 cm. No new abnormalities are seen. IMPRESSION: Interval decrease in size of the irregular hypoechoic areas in the right breast and right axillary lymph nodes with cortical thickening, as described above. RECOMMENDATION: Treatment plan. I have discussed the findings and recommendations with the patient. If applicable, a reminder letter will be sent to the patient regarding the next appointment. BI-RADS CATEGORY  6: Known biopsy-proven malignancy. Electronically Signed   By: Claudie Revering M.D.   On: 11/01/2019 17:05    ELIGIBLE FOR AVAILABLE RESEARCH PROTOCOL:   ASSESSMENT: 64 y.o. [place], [place] woman status post right breast biopsy x2 on 07/13/2019 for a clinical T3-T4 N1-2, stage III invasive ductal carcinoma, grade 2, estrogen receptor positive, progesterone receptor variable, HER-2 not amplified.  (a) breast MRI 08/15/2019 showed a 7.6 area of  enhancement, and 5 suspicious right axillary lymph nodes as well as two 0.5 mm additional masses in the inferior right breast   (1) neoadjuvant chemotherapy consisting of cyclophosphamide and doxorubicin in dose dense fashion x4 starting 08/18/2019, completed 09/28/2019, followed by weekly paclitaxel x12 starting 10/12/2019  (2) definitive surgery (right modified radical mastectomy) to follow  (3) adjuvant radiation as appropriate  (4) antiestrogens to start at the completion of local treatment   PLAN: Gazelle is tolerating weekly paclitaxel well.  She already had her first vaccine dose and will have her second vaccine dose this Friday.  This means next week she will be "off" so she has a little opportunity to make antibodies.  She will return in 11/16/2019 for cycle #4 of paclitaxel.  I commended her on her exercise program.  I reminded her to check for symptoms of peripheral neuropathy which so far thankfully have not developed  Total encounter time 25 minutes.Sarajane Jews C. Taneisha Fuson, MD 11/02/19 5:53 PM Medical Oncology and Hematology [facility] [place], [place] [address] Tel. [phone]    Fax. [phone]   I, Wilburn Mylar, am acting as scribe for Dr. Virgie Gilbert. Kayson Bullis.  I, Lurline Del MD, have reviewed the above documentation for accuracy and completeness, and I agree with the above.    *Total Encounter Time as defined by the Centers for Medicare and Medicaid Services includes, in addition to the face-to-face time of a patient visit (documented in the note above) non-face-to-face time: obtaining and reviewing outside history, ordering and reviewing medications, tests or procedures, care coordination (communications with other health care professionals or caregivers) and documentation in the medical record.

## 2019-11-01 ENCOUNTER — Other Ambulatory Visit: Payer: Self-pay

## 2019-11-01 ENCOUNTER — Ambulatory Visit
Admission: RE | Admit: 2019-11-01 | Discharge: 2019-11-01 | Disposition: A | Discharge status: Home / Self Care | Payer: Managed Care, Other (non HMO) | Source: Ambulatory Visit | Attending: Adult Health | Admitting: Adult Health

## 2019-11-01 DIAGNOSIS — C50411 Malignant neoplasm of upper-outer quadrant of right female breast: Secondary | ICD-10-CM

## 2019-11-02 ENCOUNTER — Ambulatory Visit: Payer: Managed Care, Other (non HMO)

## 2019-11-02 ENCOUNTER — Inpatient Hospital Stay: Payer: Managed Care, Other (non HMO) | Attending: Oncology

## 2019-11-02 ENCOUNTER — Other Ambulatory Visit: Payer: Managed Care, Other (non HMO)

## 2019-11-02 ENCOUNTER — Other Ambulatory Visit: Payer: Self-pay

## 2019-11-02 ENCOUNTER — Inpatient Hospital Stay: Payer: Managed Care, Other (non HMO) | Admitting: Oncology

## 2019-11-02 ENCOUNTER — Inpatient Hospital Stay: Payer: Managed Care, Other (non HMO)

## 2019-11-02 ENCOUNTER — Encounter: Payer: Self-pay | Admitting: *Deleted

## 2019-11-02 VITALS — BP 110/66 | HR 70 | Temp 98.0°F | Resp 17 | Ht 64.5 in | Wt 136.1 lb

## 2019-11-02 DIAGNOSIS — Z170 Estrogen receptor positive status [ER+]: Secondary | ICD-10-CM

## 2019-11-02 DIAGNOSIS — C50411 Malignant neoplasm of upper-outer quadrant of right female breast: Secondary | ICD-10-CM | POA: Diagnosis present

## 2019-11-02 DIAGNOSIS — Z5111 Encounter for antineoplastic chemotherapy: Secondary | ICD-10-CM | POA: Insufficient documentation

## 2019-11-02 LAB — CBC WITH DIFFERENTIAL/PLATELET
Abs Immature Granulocytes: 0.02 10*3/uL (ref 0.00–0.07)
Basophils Absolute: 0.1 10*3/uL (ref 0.0–0.1)
Basophils Relative: 2 %
Eosinophils Absolute: 0.3 10*3/uL (ref 0.0–0.5)
Eosinophils Relative: 9 %
HCT: 31.4 % — ABNORMAL LOW (ref 36.0–46.0)
Hemoglobin: 10.3 g/dL — ABNORMAL LOW (ref 12.0–15.0)
Immature Granulocytes: 1 %
Lymphocytes Relative: 22 %
Lymphs Abs: 0.8 10*3/uL (ref 0.7–4.0)
MCH: 29.9 pg (ref 26.0–34.0)
MCHC: 32.8 g/dL (ref 30.0–36.0)
MCV: 91 fL (ref 80.0–100.0)
Monocytes Absolute: 0.3 10*3/uL (ref 0.1–1.0)
Monocytes Relative: 8 %
Neutro Abs: 2.1 10*3/uL (ref 1.7–7.7)
Neutrophils Relative %: 58 %
Platelets: 167 10*3/uL (ref 150–400)
RBC: 3.45 MIL/uL — ABNORMAL LOW (ref 3.87–5.11)
RDW: 15.6 % — ABNORMAL HIGH (ref 11.5–15.5)
WBC: 3.6 10*3/uL — ABNORMAL LOW (ref 4.0–10.5)
nRBC: 0 % (ref 0.0–0.2)

## 2019-11-02 LAB — COMPREHENSIVE METABOLIC PANEL
ALT: 37 U/L (ref 0–44)
AST: 25 U/L (ref 15–41)
Albumin: 3.5 g/dL (ref 3.5–5.0)
Alkaline Phosphatase: 69 U/L (ref 38–126)
Anion gap: 7 (ref 5–15)
BUN: 11 mg/dL (ref 8–23)
CO2: 24 mmol/L (ref 22–32)
Calcium: 8.6 mg/dL — ABNORMAL LOW (ref 8.9–10.3)
Chloride: 108 mmol/L (ref 98–111)
Creatinine, Ser: 0.65 mg/dL (ref 0.44–1.00)
GFR calc Af Amer: >60 mL/min (ref >60)
GFR calc non Af Amer: >60 mL/min (ref >60)
Glucose, Bld: 93 mg/dL (ref 70–99)
Potassium: 4 mmol/L (ref 3.5–5.1)
Sodium: 139 mmol/L (ref 135–145)
Total Bilirubin: 0.3 mg/dL (ref 0.3–1.2)
Total Protein: 6.1 g/dL — ABNORMAL LOW (ref 6.5–8.1)

## 2019-11-02 MED ORDER — SODIUM CHLORIDE 0.9 % IV SOLN
Freq: Once | INTRAVENOUS | Status: AC
  Filled 2019-11-02: qty 250

## 2019-11-02 MED ORDER — DIPHENHYDRAMINE HCL 50 MG/ML IJ SOLN
INTRAMUSCULAR | Status: AC
  Filled 2019-11-02: qty 1

## 2019-11-02 MED ORDER — FAMOTIDINE IN NACL 20-0.9 MG/50ML-% IV SOLN
20.0000 mg | Freq: Once | INTRAVENOUS | Status: AC
  Administered 2019-11-02: 13:00:00 20 mg via INTRAVENOUS

## 2019-11-02 MED ORDER — DIPHENHYDRAMINE HCL 50 MG/ML IJ SOLN
25.0000 mg | Freq: Once | INTRAMUSCULAR | Status: AC
  Administered 2019-11-02: 13:00:00 25 mg via INTRAVENOUS

## 2019-11-02 MED ORDER — DEXAMETHASONE SODIUM PHOSPHATE 10 MG/ML IJ SOLN
INTRAMUSCULAR | Status: AC
  Filled 2019-11-02: qty 1

## 2019-11-02 MED ORDER — SODIUM CHLORIDE 0.9% FLUSH
10.0000 mL | INTRAVENOUS | Status: DC | PRN
  Administered 2019-11-02: 11:00:00 10 mL
  Filled 2019-11-02: qty 10

## 2019-11-02 MED ORDER — HEPARIN SOD (PORK) LOCK FLUSH 100 UNIT/ML IV SOLN
500.0000 [IU] | Freq: Once | INTRAVENOUS | Status: AC | PRN
  Administered 2019-11-02: 15:00:00 500 [IU]
  Filled 2019-11-02: qty 5

## 2019-11-02 MED ORDER — SODIUM CHLORIDE 0.9% FLUSH
10.0000 mL | INTRAVENOUS | Status: DC | PRN
  Administered 2019-11-02: 10 mL
  Filled 2019-11-02: qty 10

## 2019-11-02 MED ORDER — FAMOTIDINE IN NACL 20-0.9 MG/50ML-% IV SOLN
INTRAVENOUS | Status: AC
  Filled 2019-11-02: qty 50

## 2019-11-02 MED ORDER — SODIUM CHLORIDE 0.9 % IV SOLN
80.0000 mg/m2 | Freq: Once | INTRAVENOUS | Status: AC
  Administered 2019-11-02: 14:00:00 138 mg via INTRAVENOUS
  Filled 2019-11-02: qty 23

## 2019-11-02 MED ORDER — DEXAMETHASONE SODIUM PHOSPHATE 10 MG/ML IJ SOLN
4.0000 mg | Freq: Once | INTRAMUSCULAR | Status: AC
  Administered 2019-11-02: 13:00:00 4 mg via INTRAVENOUS

## 2019-11-02 NOTE — Patient Instructions (Signed)
[facility] Discharge Instructions for Patients Receiving Chemotherapy  Today you received the following chemotherapy agents: paclitaxel.  To help prevent nausea and vomiting after your treatment, we encourage you to take your nausea medication as directed.   If you develop nausea and vomiting that is not controlled by your nausea medication, call the clinic.   BELOW ARE SYMPTOMS THAT SHOULD BE REPORTED IMMEDIATELY:  *FEVER GREATER THAN 100.5 F  *CHILLS WITH OR WITHOUT FEVER  NAUSEA AND VOMITING THAT IS NOT CONTROLLED WITH YOUR NAUSEA MEDICATION  *UNUSUAL SHORTNESS OF BREATH  *UNUSUAL BRUISING OR BLEEDING  TENDERNESS IN MOUTH AND THROAT WITH OR WITHOUT PRESENCE OF ULCERS  *URINARY PROBLEMS  *BOWEL PROBLEMS  UNUSUAL RASH Items with * indicate a potential emergency and should be followed up as soon as possible.  Feel free to call the clinic should you have any questions or concerns. The clinic phone number is [phone].  Please show the CHEMO ALERT CARD at check-in to the Emergency Department and triage nurse.   

## 2019-11-03 NOTE — Progress Notes (Signed)
Pharmacist Chemotherapy Monitoring - Follow Up Assessment    I verify that I have reviewed each item in the below checklist:  . Regimen for the patient is scheduled for the appropriate day and plan matches scheduled date. Marland Kitchen Appropriate non-routine labs are ordered dependent on drug ordered. . If applicable, additional medications reviewed and ordered per protocol based on lifetime cumulative doses and/or treatment regimen.   Plan for follow-up and/or issues identified: Yes . I-vent associated with next due treatment: Yes . MD and/or nursing notified: Yes  Philomena Course 11/03/2019 2:53 PM

## 2019-11-08 ENCOUNTER — Other Ambulatory Visit: Payer: Self-pay | Admitting: *Deleted

## 2019-11-08 ENCOUNTER — Telehealth: Payer: Self-pay | Admitting: *Deleted

## 2019-11-08 MED ORDER — AMOXICILLIN 500 MG PO CAPS: 2000.0000 mg | ORAL_CAPSULE | Freq: Once | ORAL | 0 refills | Status: AC

## 2019-11-08 NOTE — Telephone Encounter (Signed)
Received vm call from Friday stating that she needs to go to dentist b/c she has 2 teeth that are loose where she has had some bridge work.  She wants to know if she needs to tell dentist anything.  Called & left message to let dentist know that she is undergoing chemotherapy & if he has any questions to contact Dr Jana Hakim.

## 2019-11-09 ENCOUNTER — Ambulatory Visit: Payer: Managed Care, Other (non HMO)

## 2019-11-09 ENCOUNTER — Other Ambulatory Visit: Payer: Managed Care, Other (non HMO)

## 2019-11-09 NOTE — Progress Notes (Signed)
Pharmacist Chemotherapy Monitoring - Follow Up Assessment    I verify that I have reviewed each item in the below checklist:  . Regimen for the patient is scheduled for the appropriate day and plan matches scheduled date. Marland Kitchen Appropriate non-routine labs are ordered dependent on drug ordered. . If applicable, additional medications reviewed and ordered per protocol based on lifetime cumulative doses and/or treatment regimen.   Plan for follow-up and/or issues identified: No . I-vent associated with next due treatment: Yes . MD and/or nursing notified: No  Acquanetta Belling 11/09/2019 5:22 PM

## 2019-11-15 NOTE — Progress Notes (Signed)
[place]  [phone] [phone]     ID: Nishat Livingston DOB: Aug 24, 1955  MR#: [id number]  KYH#:[id number]  Patient Care Team: [name], NP as PCP - General (Family Medicine) [name], RN as Oncology Nurse Navigator [name], RN as Oncology Nurse Navigator Magrinat, [name], MD as Consulting Physician (Oncology) [name], MD as Attending Physician (Radiation Oncology) [name], MD as Consulting Physician (General Surgery) Scot Dock, NP OTHER MD:  CHIEF COMPLAINT: Locally advanced estrogen receptor positive breast cancer  CURRENT TREATMENT: Neoadjuvant chemotherapy   INTERVAL HISTORY: Shaneka returns today for follow up and treatment of her locally advanced breast cancer.  She completed her first 4 cycles of intensive doxorubicin and cyclophosphamide.  She has gone on to receive Paclitaxel given weekly x 12 cycles.  Today is week 4 of 12 planned treatments.  Her most recent Paclitaxel was on 11/02/2019.  She delayed due to the COVID vaccine, which she received her second dose on 11/05/2019.  She tolerated this well, and took the following week off from chemotherapy to get an immune response from the vaccine.   She underwent right breast ultrasound on 11/01/2019..  This shows a good initial response to chemotherapy, approximately 40% reduction.   REVIEW OF SYSTEMS: Tanijah is doing well today.  She is continuing to walk and clean houses.  She denies any new issues such as fever, chills, chest pain, palpitations, cough, bowel/bladder changes, headaches, vision issues, nausea, vomiting, or any other concerns.  She in particular has no peripheral neuropathy.      HISTORY OF CURRENT ILLNESS: From the original intake note:  Alyxis Grippi has noted a difference between her right and left breast for several years.  She last had a mammogram she thinks about 7 or 8 years ago.  More recently it seemed to her  that her right breast was becoming more firm.  She brought it to medical attention and underwent bilateral diagnostic mammography with tomography and right breast ultrasonography at [facility] on 07/07/2019 showing: heterogeneously dense breast tissue; right nipple retraction; architectural distortion with microcalcifications and irregular dense tissue in the upper-outer quadrant; at least 3 abnormal right axillary lymph nodes.  Accordingly on 07/13/2019 she proceeded to biopsy of the right breast area in question. The pathology from this procedure ([id number]) showed:   1. Right Breast, upper-outer quadrant, closer to the nipple  - invasive ductal carcinoma with micropapillary features, grade 2  - ductal carcinoma in situ, grade 2-3, solid and cribriform pattern with comedonecrosis  - Prognostic indicators significant for: estrogen receptor, 100% positive with strong staining intensity and progesterone receptor, 0% negative. HER2 equivocal by immunohistochemistry (2+), but negative by fluorescent in situ hybridization with a signals ratio 1.5 and number per cell 3.8.  2. Right Breast, upper-outer quadrant, axillary tail  - invasive ductal carcinoma, grade 2  - ductal carcinoma in situ, grade 2-3  - Prognostic indicators significant for: estrogen receptor, 100% positive and progesterone receptor, 50% positive, both with strong staining intensity. HER2 equivocal by immunohistochemistry (2+), but negative by fluorescent in situ hybridization with a signals ratio 1.9 and number per cell 5.3.  The patient's subsequent history is as detailed below.   PAST MEDICAL HISTORY: Past Medical History:  Diagnosis Date  . Anemia   . right breast ca dx'd 06/2019  . Varicose vein   . Varicose veins of left lower extremity    "bluging ones at top- no pain"    PAST SURGICAL  HISTORY: Past Surgical History:  Procedure Laterality Date  . NO PAST SURGERIES    . none    . PORTACATH PLACEMENT Right  08/17/2019   Procedure: INSERTION PORT-A-CATH WITH ULTRASOUND;  Surgeon: [name], MD;  Location: [place];  Service: General;  Laterality: Right;  . wisdom teeth removal     remotely.     FAMILY HISTORY: Family History  Problem Relation Age of Onset  . Diabetes Father   . Blindness Mother   . Congestive Heart Failure Sister   . Gallbladder disease Sister        removed  . Diabetes Sister   . Colitis Sister    The patient's father died at age 42 from a stroke.  The patient's mother died from multiple medical problems not related to cancer at age 47.  The patient had 4 brothers, 7 sisters.  There is no breast ovarian or prostate cancer in the family to her knowledge.  She had one cousin with pancreatic cancer.   GYNECOLOGIC HISTORY:  No LMP recorded. Patient is postmenopausal. Menarche: 64 years old Age at first live birth: 64 years old [place] P 5 LMP 66 Contraceptive: Brief use of oral contraceptives as a teenager HRT no Hysterectomy?  No BSO?    SOCIAL HISTORY: (updated 07/2019)  Leontine works in a Engineer, agricultural, and Education administrator houses.  Her husband of 36 years [name] is a Chief Operating Officer.  Their children are [name], 27, who lives in [place] and is a Veterinary surgeon in the [employer]; [name] 33 who works as a Designer, jewellery at [employer]; [name] who lives in [place], and is not currently employed; Sorrel lives in [place], and [name] who is an Armed forces training and education officer in [place].  The patient has 4 grandchildren all under the age of 61    ADVANCED DIRECTIVES: Husband [name] is her HCPOA.   HEALTH MAINTENANCE: Social History   Tobacco Use  . Smoking status: Never Smoker  . Smokeless tobacco: Never Used  Substance Use Topics  . Alcohol use: Yes    Comment: rarely  . Drug use: No     Colonoscopy: never done  PAP: Remote  Bone density: Never   No Known Allergies  Current Outpatient Medications  Medication Sig Dispense Refill  . Calcium  Citrate-Vitamin D (CALCIUM CITRATE + D PO) Take 1 tablet by mouth 2 (two) times daily.     Marland Kitchen ibuprofen (ADVIL) 800 MG tablet Take 1 tablet (800 mg total) by mouth every 8 (eight) hours as needed. 30 tablet 0  . lidocaine-prilocaine (EMLA) cream Apply to affected area once 30 g 3  . LORazepam (ATIVAN) 0.5 MG tablet Take 1 tablet (0.5 mg total) by mouth at bedtime as needed (Nausea or vomiting). 30 tablet 0  . oxyCODONE (OXY IR/ROXICODONE) 5 MG immediate release tablet     . prochlorperazine (COMPAZINE) 10 MG tablet Take 1 tablet (10 mg total) by mouth every 6 (six) hours as needed (Nausea or vomiting). 30 tablet 1   No current facility-administered medications for this visit.    OBJECTIVE:  Vitals:   11/16/19 1146  BP: (!) 119/59  Pulse: 67  Resp: 17  Temp: 98.5 F (36.9 C)  SpO2: 100%     Body mass index is 23.17 kg/m.   Wt Readings from Last 3 Encounters:  11/16/19 137 lb 1.6 oz (62.2 kg)  11/02/19 136 lb 1.6 oz (61.7 kg)  10/12/19 134 lb 3.2 oz (60.9 kg)  ECOG FS:1 - Symptomatic but  completely ambulatory GENERAL: Patient is a well appearing female in no acute distress HEENT:  Sclerae anicteric. Mask in place. Neck is supple.  NODES:  No cervical, supraclavicular, or axillary lymphadenopathy palpated.  BREAST EXAM:  Deferred. LUNGS:  Clear to auscultation bilaterally.  No wheezes or rhonchi. HEART:  Regular rate and rhythm. No murmur appreciated. ABDOMEN:  Soft, nontender.  Positive, normoactive bowel sounds. No organomegaly palpated. MSK:  No focal spinal tenderness to palpation.  EXTREMITIES:  No peripheral edema.   SKIN:  Clear with no obvious rashes or skin changes. No nail dyscrasia. NEURO:  Nonfocal. Well oriented.  Appropriate affect.    LAB RESULTS:  CMP     Component Value Date/Time   NA 139 11/02/2019 1059   K 4.0 11/02/2019 1059   CL 108 11/02/2019 1059   CO2 24 11/02/2019 1059   GLUCOSE 93 11/02/2019 1059   BUN 11 11/02/2019 1059   CREATININE 0.65  11/02/2019 1059   CREATININE 0.79 08/04/2019 1546   CREATININE 0.76 03/27/2015 1146   CALCIUM 8.6 (L) 11/02/2019 1059   PROT 6.1 (L) 11/02/2019 1059   ALBUMIN 3.5 11/02/2019 1059   AST 25 11/02/2019 1059   AST 17 08/04/2019 1546   ALT 37 11/02/2019 1059   ALT 21 08/04/2019 1546   ALKPHOS 69 11/02/2019 1059   BILITOT 0.3 11/02/2019 1059   BILITOT 0.2 (L) 08/04/2019 1546   GFRNONAA >60 11/02/2019 1059   GFRNONAA >60 08/04/2019 1546   GFRNONAA 87 03/27/2015 1146   GFRAA >60 11/02/2019 1059   GFRAA >60 08/04/2019 1546   GFRAA >89 03/27/2015 1146    No results found for: TOTALPROTELP, ALBUMINELP, A1GS, A2GS, BETS, BETA2SER, GAMS, MSPIKE, SPEI  Lab Results  Component Value Date   WBC 3.4 (L) 11/16/2019   NEUTROABS 1.9 11/16/2019   HGB 11.2 (L) 11/16/2019   HCT 33.9 (L) 11/16/2019   MCV 89.4 11/16/2019   PLT 162 11/16/2019    No results found for: LABCA2  No components found for: ASTMHD622  No results for input(s): INR in the last 168 hours.  No results found for: LABCA2  No results found for: CAN199  No results found for: WLN989  No results found for: QJJ941  Lab Results  Component Value Date   CA2729 27.4 08/18/2019    No components found for: HGQUANT  No results found for: CEA1 / No results found for: CEA1   No results found for: AFPTUMOR  No results found for: CHROMOGRNA  No results found for: KPAFRELGTCHN, LAMBDASER, KAPLAMBRATIO (kappa/lambda light chains)  No results found for: HGBA, HGBA2QUANT, HGBFQUANT, HGBSQUAN (Hemoglobinopathy evaluation)   No results found for: LDH  Lab Results  Component Value Date   IRON 94 02/15/2015   TIBC 272 02/15/2015   IRONPCTSAT 35 02/15/2015   (Iron and TIBC)  Lab Results  Component Value Date   FERRITIN 70 02/15/2015    Urinalysis No results found for: COLORURINE, APPEARANCEUR, LABSPEC, PHURINE, GLUCOSEU, HGBUR, BILIRUBINUR, KETONESUR, PROTEINUR, UROBILINOGEN, NITRITE, LEUKOCYTESUR   STUDIES: US  BREAST LTD UNI RIGHT INC AXILLA  Result Date: 11/01/2019 CLINICAL DATA:  Follow-up right breast cancer and metastatic adenopathy treated with neoadjuvant chemotherapy. EXAM: ULTRASOUND OF THE RIGHT BREAST COMPARISON:  Previous right breast and axillary ultrasound performed at [facility] in [place], [place] on 07/07/2019. FINDINGS: Targeted ultrasound is performed and again demonstrates irregular hypoechoic areas extending from the right nipple to the right axilla with approximately 4 right axillary lymph nodes with abnormal cortical thickening. The  cortical thickening has improved. Previously, the largest node had a maximum cortical thickness of 6.4 mm and currently has a maximum cortical thickness of 3.5 mm. A previously measured 4.5 x 3.0 x 1.7 cm irregular hypoechoic area in the 10 o'clock position of the right breast, 7 cm from the nipple, currently measures 4.5 x 2.6 x 0.9 cm. This corresponds to a palpable mass today and previously. A previously measured 3.3 x 2.2 x 1.6 cm irregular hypoechoic area in the right breast in the 12 o'clock position, 1 cm from the nipple currently measures 1.5 x 1.1 x 0.9 cm. No new abnormalities are seen. IMPRESSION: Interval decrease in size of the irregular hypoechoic areas in the right breast and right axillary lymph nodes with cortical thickening, as described above. RECOMMENDATION: Treatment plan. I have discussed the findings and recommendations with the patient. If applicable, a reminder letter will be sent to the patient regarding the next appointment. BI-RADS CATEGORY  6: Known biopsy-proven malignancy. Electronically Signed   By: Claudie Revering M.D.   On: 11/01/2019 17:05    ELIGIBLE FOR AVAILABLE RESEARCH PROTOCOL:   ASSESSMENT: 63 y.o. [place], [place] woman status post right breast biopsy x2 on 07/13/2019 for a clinical T3-T4 N1-2, stage III invasive ductal carcinoma, grade 2, estrogen receptor positive, progesterone receptor variable,  HER-2 not amplified.  (a) breast MRI 08/15/2019 showed a 7.6 area of enhancement, and 5 suspicious right axillary lymph nodes as well as two 0.5 mm additional masses in the inferior right breast   (1) neoadjuvant chemotherapy consisting of cyclophosphamide and doxorubicin in dose dense fashion x4 starting 08/18/2019, completed 09/28/2019, followed by weekly paclitaxel x12 starting 10/12/2019  (2) definitive surgery (right modified radical mastectomy) to follow  (3) adjuvant radiation as appropriate  (4) antiestrogens to start at the completion of local treatment   PLAN: Reiko is doing well today.  She is going to continue on weekly Pacltiaxel and has been tolerating it well.  Her CBC today is stable.  We are monitoring her closely for peripheral neuropathy, which she has not yet developed.    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  I recommended that she follow-up with Dr. Barry Dienes about the timing she will need to be off and restricted in regards to her work with her right mastectomy.    Olivene will return weekly for chemotherapy.  I requested the rest of her chemotherapy be scheduled.  We will see her with every other weekly Paclitaxel until she gets to week 8, and then at that point we will see her every week.    She was recommended to continue with the appropriate pandemic precautions. She knows to call for any questions that may arise between now and her next appointment.  We are happy to see her sooner if needed.   Total encounter time 20 minutes.Wilber Bihari, NP 11/16/19 12:16 PM Medical Oncology and Hematology [facility] [place], [place] [address] Tel. [phone]    Fax. [phone]  *Total Encounter Time as defined by the Centers for Medicare and Medicaid Services includes, in addition to the face-to-face time of a patient visit (documented in the note above) non-face-to-face time: obtaining and  reviewing outside history, ordering and reviewing medications, tests or procedures, care coordination (communications with other health care professionals or caregivers) and documentation in the medical record.

## 2019-11-16 ENCOUNTER — Other Ambulatory Visit: Payer: Self-pay

## 2019-11-16 ENCOUNTER — Inpatient Hospital Stay (HOSPITAL_BASED_OUTPATIENT_CLINIC_OR_DEPARTMENT_OTHER): Payer: Managed Care, Other (non HMO) | Admitting: Adult Health

## 2019-11-16 ENCOUNTER — Inpatient Hospital Stay: Payer: Managed Care, Other (non HMO)

## 2019-11-16 ENCOUNTER — Ambulatory Visit: Payer: Managed Care, Other (non HMO)

## 2019-11-16 ENCOUNTER — Encounter: Payer: Self-pay | Admitting: Adult Health

## 2019-11-16 ENCOUNTER — Other Ambulatory Visit: Payer: Managed Care, Other (non HMO)

## 2019-11-16 VITALS — BP 119/59 | HR 67 | Temp 98.5°F | Resp 17 | Ht 64.5 in | Wt 137.1 lb

## 2019-11-16 DIAGNOSIS — Z5111 Encounter for antineoplastic chemotherapy: Secondary | ICD-10-CM | POA: Diagnosis not present

## 2019-11-16 DIAGNOSIS — Z170 Estrogen receptor positive status [ER+]: Secondary | ICD-10-CM | POA: Diagnosis not present

## 2019-11-16 DIAGNOSIS — C50411 Malignant neoplasm of upper-outer quadrant of right female breast: Secondary | ICD-10-CM | POA: Diagnosis not present

## 2019-11-16 LAB — CBC WITH DIFFERENTIAL/PLATELET
Abs Immature Granulocytes: 0.01 10*3/uL (ref 0.00–0.07)
Basophils Absolute: 0.1 10*3/uL (ref 0.0–0.1)
Basophils Relative: 2 %
Eosinophils Absolute: 0.2 10*3/uL (ref 0.0–0.5)
Eosinophils Relative: 6 %
HCT: 33.9 % — ABNORMAL LOW (ref 36.0–46.0)
Hemoglobin: 11.2 g/dL — ABNORMAL LOW (ref 12.0–15.0)
Immature Granulocytes: 0 %
Lymphocytes Relative: 29 %
Lymphs Abs: 1 10*3/uL (ref 0.7–4.0)
MCH: 29.6 pg (ref 26.0–34.0)
MCHC: 33 g/dL (ref 30.0–36.0)
MCV: 89.4 fL (ref 80.0–100.0)
Monocytes Absolute: 0.3 10*3/uL (ref 0.1–1.0)
Monocytes Relative: 7 %
Neutro Abs: 1.9 10*3/uL (ref 1.7–7.7)
Neutrophils Relative %: 56 %
Platelets: 162 10*3/uL (ref 150–400)
RBC: 3.79 MIL/uL — ABNORMAL LOW (ref 3.87–5.11)
RDW: 13.9 % (ref 11.5–15.5)
WBC: 3.4 10*3/uL — ABNORMAL LOW (ref 4.0–10.5)
nRBC: 0 % (ref 0.0–0.2)

## 2019-11-16 LAB — COMPREHENSIVE METABOLIC PANEL
ALT: 41 U/L (ref 0–44)
AST: 31 U/L (ref 15–41)
Albumin: 3.6 g/dL (ref 3.5–5.0)
Alkaline Phosphatase: 72 U/L (ref 38–126)
Anion gap: 8 (ref 5–15)
BUN: 11 mg/dL (ref 8–23)
CO2: 23 mmol/L (ref 22–32)
Calcium: 8.7 mg/dL — ABNORMAL LOW (ref 8.9–10.3)
Chloride: 108 mmol/L (ref 98–111)
Creatinine, Ser: 0.72 mg/dL (ref 0.44–1.00)
GFR calc Af Amer: >60 mL/min (ref >60)
GFR calc non Af Amer: >60 mL/min (ref >60)
Glucose, Bld: 101 mg/dL — ABNORMAL HIGH (ref 70–99)
Potassium: 4.2 mmol/L (ref 3.5–5.1)
Sodium: 139 mmol/L (ref 135–145)
Total Bilirubin: 0.3 mg/dL (ref 0.3–1.2)
Total Protein: 6.2 g/dL — ABNORMAL LOW (ref 6.5–8.1)

## 2019-11-16 MED ORDER — DEXAMETHASONE SODIUM PHOSPHATE 10 MG/ML IJ SOLN
INTRAMUSCULAR | Status: AC
  Filled 2019-11-16: qty 1

## 2019-11-16 MED ORDER — FAMOTIDINE IN NACL 20-0.9 MG/50ML-% IV SOLN
20.0000 mg | Freq: Once | INTRAVENOUS | Status: AC
  Administered 2019-11-16: 13:00:00 20 mg via INTRAVENOUS

## 2019-11-16 MED ORDER — FAMOTIDINE IN NACL 20-0.9 MG/50ML-% IV SOLN
INTRAVENOUS | Status: AC
  Filled 2019-11-16: qty 50

## 2019-11-16 MED ORDER — DEXAMETHASONE SODIUM PHOSPHATE 10 MG/ML IJ SOLN
4.0000 mg | Freq: Once | INTRAMUSCULAR | Status: AC
  Administered 2019-11-16: 4 mg via INTRAVENOUS

## 2019-11-16 MED ORDER — SODIUM CHLORIDE 0.9 % IV SOLN
Freq: Once | INTRAVENOUS | Status: AC
  Filled 2019-11-16: qty 250

## 2019-11-16 MED ORDER — DIPHENHYDRAMINE HCL 50 MG/ML IJ SOLN
25.0000 mg | Freq: Once | INTRAMUSCULAR | Status: AC
  Administered 2019-11-16: 25 mg via INTRAVENOUS

## 2019-11-16 MED ORDER — HEPARIN SOD (PORK) LOCK FLUSH 100 UNIT/ML IV SOLN
500.0000 [IU] | Freq: Once | INTRAVENOUS | Status: AC | PRN
  Administered 2019-11-16: 500 [IU]
  Filled 2019-11-16: qty 5

## 2019-11-16 MED ORDER — DIPHENHYDRAMINE HCL 50 MG/ML IJ SOLN
INTRAMUSCULAR | Status: AC
  Filled 2019-11-16: qty 1

## 2019-11-16 MED ORDER — SODIUM CHLORIDE 0.9% FLUSH
10.0000 mL | INTRAVENOUS | Status: DC | PRN
  Administered 2019-11-16: 10 mL
  Filled 2019-11-16: qty 10

## 2019-11-16 MED ORDER — SODIUM CHLORIDE 0.9 % IV SOLN
80.0000 mg/m2 | Freq: Once | INTRAVENOUS | Status: AC
  Administered 2019-11-16: 138 mg via INTRAVENOUS
  Filled 2019-11-16: qty 23

## 2019-11-16 NOTE — Patient Instructions (Signed)
[facility] Discharge Instructions for Patients Receiving Chemotherapy  Today you received the following chemotherapy agents: paclitaxel.  To help prevent nausea and vomiting after your treatment, we encourage you to take your nausea medication as directed.   If you develop nausea and vomiting that is not controlled by your nausea medication, call the clinic.   BELOW ARE SYMPTOMS THAT SHOULD BE REPORTED IMMEDIATELY:  *FEVER GREATER THAN 100.5 F  *CHILLS WITH OR WITHOUT FEVER  NAUSEA AND VOMITING THAT IS NOT CONTROLLED WITH YOUR NAUSEA MEDICATION  *UNUSUAL SHORTNESS OF BREATH  *UNUSUAL BRUISING OR BLEEDING  TENDERNESS IN MOUTH AND THROAT WITH OR WITHOUT PRESENCE OF ULCERS  *URINARY PROBLEMS  *BOWEL PROBLEMS  UNUSUAL RASH Items with * indicate a potential emergency and should be followed up as soon as possible.  Feel free to call the clinic should you have any questions or concerns. The clinic phone number is [phone].  Please show the CHEMO ALERT CARD at check-in to the Emergency Department and triage nurse.   

## 2019-11-17 ENCOUNTER — Telehealth: Payer: Self-pay | Admitting: Adult Health

## 2019-11-17 NOTE — Telephone Encounter (Signed)
Scheduled appts per 4/20 los. Pt stated she would refer to mychart for the added appts and call back if she needs to reschedule anything.

## 2019-11-17 NOTE — Progress Notes (Signed)
Pharmacist Chemotherapy Monitoring - Follow Up Assessment    I verify that I have reviewed each item in the below checklist:  . Regimen for the patient is scheduled for the appropriate day and plan matches scheduled date. Marland Kitchen Appropriate non-routine labs are ordered dependent on drug ordered. . If applicable, additional medications reviewed and ordered per protocol based on lifetime cumulative doses and/or treatment regimen.   Plan for follow-up and/or issues identified: No . I-vent associated with next due treatment: No . MD and/or nursing notified: No  Philomena Course 11/17/2019 8:37 AM

## 2019-11-22 ENCOUNTER — Encounter: Payer: Self-pay | Admitting: *Deleted

## 2019-11-23 ENCOUNTER — Other Ambulatory Visit: Payer: Self-pay | Admitting: Oncology

## 2019-11-23 ENCOUNTER — Inpatient Hospital Stay: Payer: Managed Care, Other (non HMO)

## 2019-11-23 ENCOUNTER — Telehealth: Payer: Self-pay | Admitting: Adult Health

## 2019-11-23 ENCOUNTER — Other Ambulatory Visit: Payer: Self-pay

## 2019-11-23 VITALS — BP 104/66 | HR 68 | Temp 98.3°F | Resp 18 | Wt 138.5 lb

## 2019-11-23 DIAGNOSIS — C50411 Malignant neoplasm of upper-outer quadrant of right female breast: Secondary | ICD-10-CM

## 2019-11-23 DIAGNOSIS — Z170 Estrogen receptor positive status [ER+]: Secondary | ICD-10-CM

## 2019-11-23 DIAGNOSIS — Z5111 Encounter for antineoplastic chemotherapy: Secondary | ICD-10-CM | POA: Diagnosis not present

## 2019-11-23 LAB — COMPREHENSIVE METABOLIC PANEL
ALT: 43 U/L (ref 0–44)
AST: 26 U/L (ref 15–41)
Albumin: 3.6 g/dL (ref 3.5–5.0)
Alkaline Phosphatase: 72 U/L (ref 38–126)
Anion gap: 8 (ref 5–15)
BUN: 15 mg/dL (ref 8–23)
CO2: 25 mmol/L (ref 22–32)
Calcium: 8.8 mg/dL — ABNORMAL LOW (ref 8.9–10.3)
Chloride: 108 mmol/L (ref 98–111)
Creatinine, Ser: 0.67 mg/dL (ref 0.44–1.00)
GFR calc Af Amer: >60 mL/min (ref >60)
GFR calc non Af Amer: >60 mL/min (ref >60)
Glucose, Bld: 104 mg/dL — ABNORMAL HIGH (ref 70–99)
Potassium: 4 mmol/L (ref 3.5–5.1)
Sodium: 141 mmol/L (ref 135–145)
Total Bilirubin: 0.3 mg/dL (ref 0.3–1.2)
Total Protein: 5.9 g/dL — ABNORMAL LOW (ref 6.5–8.1)

## 2019-11-23 LAB — CBC WITH DIFFERENTIAL/PLATELET
Abs Immature Granulocytes: 0.01 10*3/uL (ref 0.00–0.07)
Basophils Absolute: 0 10*3/uL (ref 0.0–0.1)
Basophils Relative: 2 %
Eosinophils Absolute: 0.1 10*3/uL (ref 0.0–0.5)
Eosinophils Relative: 4 %
HCT: 31.8 % — ABNORMAL LOW (ref 36.0–46.0)
Hemoglobin: 10.6 g/dL — ABNORMAL LOW (ref 12.0–15.0)
Immature Granulocytes: 0 %
Lymphocytes Relative: 31 %
Lymphs Abs: 0.8 10*3/uL (ref 0.7–4.0)
MCH: 30.1 pg (ref 26.0–34.0)
MCHC: 33.3 g/dL (ref 30.0–36.0)
MCV: 90.3 fL (ref 80.0–100.0)
Monocytes Absolute: 0.2 10*3/uL (ref 0.1–1.0)
Monocytes Relative: 6 %
Neutro Abs: 1.5 10*3/uL — ABNORMAL LOW (ref 1.7–7.7)
Neutrophils Relative %: 57 %
Platelets: 140 10*3/uL — ABNORMAL LOW (ref 150–400)
RBC: 3.52 MIL/uL — ABNORMAL LOW (ref 3.87–5.11)
RDW: 13 % (ref 11.5–15.5)
WBC: 2.7 10*3/uL — ABNORMAL LOW (ref 4.0–10.5)
nRBC: 0 % (ref 0.0–0.2)

## 2019-11-23 MED ORDER — SODIUM CHLORIDE 0.9 % IV SOLN
Freq: Once | INTRAVENOUS | Status: AC
  Filled 2019-11-23: qty 250

## 2019-11-23 MED ORDER — SODIUM CHLORIDE 0.9 % IV SOLN
80.0000 mg/m2 | Freq: Once | INTRAVENOUS | Status: AC
  Administered 2019-11-23: 138 mg via INTRAVENOUS
  Filled 2019-11-23: qty 23

## 2019-11-23 MED ORDER — FAMOTIDINE IN NACL 20-0.9 MG/50ML-% IV SOLN
20.0000 mg | Freq: Once | INTRAVENOUS | Status: AC
  Administered 2019-11-23: 20 mg via INTRAVENOUS

## 2019-11-23 MED ORDER — DIPHENHYDRAMINE HCL 50 MG/ML IJ SOLN
INTRAMUSCULAR | Status: AC
  Filled 2019-11-23: qty 1

## 2019-11-23 MED ORDER — FAMOTIDINE IN NACL 20-0.9 MG/50ML-% IV SOLN
INTRAVENOUS | Status: AC
  Filled 2019-11-23: qty 50

## 2019-11-23 MED ORDER — HEPARIN SOD (PORK) LOCK FLUSH 100 UNIT/ML IV SOLN
500.0000 [IU] | Freq: Once | INTRAVENOUS | Status: AC | PRN
  Administered 2019-11-23: 500 [IU]
  Filled 2019-11-23: qty 5

## 2019-11-23 MED ORDER — DEXAMETHASONE SODIUM PHOSPHATE 10 MG/ML IJ SOLN
4.0000 mg | Freq: Once | INTRAMUSCULAR | Status: AC
  Administered 2019-11-23: 4 mg via INTRAVENOUS

## 2019-11-23 MED ORDER — DIPHENHYDRAMINE HCL 50 MG/ML IJ SOLN
25.0000 mg | Freq: Once | INTRAMUSCULAR | Status: AC
  Administered 2019-11-23: 25 mg via INTRAVENOUS

## 2019-11-23 MED ORDER — SODIUM CHLORIDE 0.9% FLUSH
10.0000 mL | INTRAVENOUS | Status: DC | PRN
  Administered 2019-11-23: 10 mL
  Filled 2019-11-23: qty 10

## 2019-11-23 MED ORDER — DEXAMETHASONE SODIUM PHOSPHATE 10 MG/ML IJ SOLN
INTRAMUSCULAR | Status: AC
  Filled 2019-11-23: qty 1

## 2019-11-23 MED ORDER — SODIUM CHLORIDE 0.9% FLUSH
10.0000 mL | INTRAVENOUS | Status: DC | PRN
  Administered 2019-11-23: 17:00:00 10 mL
  Filled 2019-11-23: qty 10

## 2019-11-23 NOTE — Patient Instructions (Signed)

## 2019-11-23 NOTE — Patient Instructions (Signed)
[facility] Discharge Instructions for Patients Receiving Chemotherapy  Today you received the following chemotherapy agents: paclitaxel.  To help prevent nausea and vomiting after your treatment, we encourage you to take your nausea medication as directed.   If you develop nausea and vomiting that is not controlled by your nausea medication, call the clinic.   BELOW ARE SYMPTOMS THAT SHOULD BE REPORTED IMMEDIATELY:  *FEVER GREATER THAN 100.5 F  *CHILLS WITH OR WITHOUT FEVER  NAUSEA AND VOMITING THAT IS NOT CONTROLLED WITH YOUR NAUSEA MEDICATION  *UNUSUAL SHORTNESS OF BREATH  *UNUSUAL BRUISING OR BLEEDING  TENDERNESS IN MOUTH AND THROAT WITH OR WITHOUT PRESENCE OF ULCERS  *URINARY PROBLEMS  *BOWEL PROBLEMS  UNUSUAL RASH Items with * indicate a potential emergency and should be followed up as soon as possible.  Feel free to call the clinic should you have any questions or concerns. The clinic phone number is [phone].  Please show the CHEMO ALERT CARD at check-in to the Emergency Department and triage nurse.   

## 2019-11-23 NOTE — Telephone Encounter (Signed)
Rescheduled 5/4 appts per pt's request for earlier appts. Pt confirmed new appt date and time.

## 2019-11-24 NOTE — Progress Notes (Signed)
Pharmacist Chemotherapy Monitoring - Follow Up Assessment    I verify that I have reviewed each item in the below checklist:  . Regimen for the patient is scheduled for the appropriate day and plan matches scheduled date. Marland Kitchen Appropriate non-routine labs are ordered dependent on drug ordered. . If applicable, additional medications reviewed and ordered per protocol based on lifetime cumulative doses and/or treatment regimen.   Plan for follow-up and/or issues identified: No . I-vent associated with next due treatment: No . MD and/or nursing notified: No  Paige Gilbert [facility] 11/24/2019 11:48 AM

## 2019-11-29 NOTE — Progress Notes (Signed)
[facility]  [phone] [phone]     ID: Paige Gilbert DOB: 1955/10/15  MR#: [id number]  RWE#:[id number]  Patient Care Team: [name], NP as PCP - General (Family Medicine) [name], RN as Oncology Nurse Navigator [name], RN as Oncology Nurse Navigator Magrinat, [name], MD as Consulting Physician (Oncology) [name], MD as Attending Physician (Radiation Oncology) [name], MD as Consulting Physician (General Surgery) Scot Dock, NP OTHER MD:  CHIEF COMPLAINT: Locally advanced estrogen receptor positive breast cancer  CURRENT TREATMENT: Neoadjuvant chemotherapy   INTERVAL HISTORY: Paige Gilbert returns today for follow up and treatment of her locally advanced breast cancer.  She completed her first 4 cycles of intensive doxorubicin and cyclophosphamide.  She has gone on to receive Paclitaxel given weekly x 12 cycles.  Today is week 6 of 12 planned treatments.  She has no peripheral neuropathy.  She underwent right breast ultrasound on 11/01/2019.  This shows a good initial response to chemotherapy, approximately 40% reduction.   REVIEW OF SYSTEMS: Paige Gilbert is doing well today.  She is c/o a sore mouth today.  Particularly around the edges.  She is developing some mild fatigue.  She says that she has remained busy with her sister visiting from out of town, and working, and she plans on sending her sister off today and then sleeping very well tonight.    She notes some occasional headaches.  She says that starting on Sunday she had a mild swelling and tightness at her port site.  She denies any tenderness, erythema, drainage.  She has no fever, chills, cough, shortness of breath, bowel/bladder changes, nausea, or vomiting.  A detailed ROS was otherwise non contributory.  HISTORY OF CURRENT ILLNESS: From the original intake note:  Paige Gilbert has noted a difference between her right and left breast  for several years.  She last had a mammogram she thinks about 7 or 8 years ago.  More recently it seemed to her that her right breast was becoming more firm.  She brought it to medical attention and underwent bilateral diagnostic mammography with tomography and right breast ultrasonography at [facility] on 07/07/2019 showing: heterogeneously dense breast tissue; right nipple retraction; architectural distortion with microcalcifications and irregular dense tissue in the upper-outer quadrant; at least 3 abnormal right axillary lymph nodes.  Accordingly on 07/13/2019 she proceeded to biopsy of the right breast area in question. The pathology from this procedure ([id number]) showed:   1. Right Breast, upper-outer quadrant, closer to the nipple  - invasive ductal carcinoma with micropapillary features, grade 2  - ductal carcinoma in situ, grade 2-3, solid and cribriform pattern with comedonecrosis  - Prognostic indicators significant for: estrogen receptor, 100% positive with strong staining intensity and progesterone receptor, 0% negative. HER2 equivocal by immunohistochemistry (2+), but negative by fluorescent in situ hybridization with a signals ratio 1.5 and number per cell 3.8.  2. Right Breast, upper-outer quadrant, axillary tail  - invasive ductal carcinoma, grade 2  - ductal carcinoma in situ, grade 2-3  - Prognostic indicators significant for: estrogen receptor, 100% positive and progesterone receptor, 50% positive, both with strong staining intensity. HER2 equivocal by immunohistochemistry (2+), but negative by fluorescent in situ hybridization with a signals ratio 1.9 and number per cell 5.3.  The patient's subsequent history is as detailed below.   PAST MEDICAL HISTORY: Past Medical History:  Diagnosis Date  . Anemia   . right breast ca dx'd 06/2019  . Varicose vein   .  Varicose veins of left lower extremity    "bluging ones at top- no pain"    PAST SURGICAL HISTORY: Past  Surgical History:  Procedure Laterality Date  . NO PAST SURGERIES    . none    . PORTACATH PLACEMENT Right 08/17/2019   Procedure: INSERTION PORT-A-CATH WITH ULTRASOUND;  Surgeon: [name], MD;  Location: [facility];  Service: General;  Laterality: Right;  . wisdom teeth removal     remotely.     FAMILY HISTORY: Family History  Problem Relation Age of Onset  . Diabetes Father   . Blindness Mother   . Congestive Heart Failure Sister   . Gallbladder disease Sister        removed  . Diabetes Sister   . Colitis Sister    The patient's father died at age 71 from a stroke.  The patient's mother died from multiple medical problems not related to cancer at age 86.  The patient had 4 brothers, 7 sisters.  There is no breast ovarian or prostate cancer in the family to her knowledge.  She had one cousin with pancreatic cancer.   GYNECOLOGIC HISTORY:  No LMP recorded. Patient is postmenopausal. Menarche: 64 years old Age at first live birth: 64 years old [place] P 5 LMP 73 Contraceptive: Brief use of oral contraceptives as a teenager HRT no Hysterectomy?  No BSO?    SOCIAL HISTORY: (updated 07/2019)  Paige Gilbert works in a Engineer, agricultural, and Education administrator houses.  Her husband of 50 years [name] is a Chief Operating Officer.  Their children are [name], 53, who lives in [place] and is a Veterinary surgeon in the [employer]; [name] 33 who works as a Designer, jewellery at [employer]; [name] who lives in [place], and is not currently employed; D[place] lives in [place], and [name] who is an Armed forces training and education officer in [place].  The patient has 4 grandchildren all under the age of 50    ADVANCED DIRECTIVES: Husband [name] is her HCPOA.   HEALTH MAINTENANCE: Social History   Tobacco Use  . Smoking status: Never Smoker  . Smokeless tobacco: Never Used  Substance Use Topics  . Alcohol use: Yes    Comment: rarely  . Drug use: No     Colonoscopy: never done  PAP: Remote  Bone  density: Never   No Known Allergies  Current Outpatient Medications  Medication Sig Dispense Refill  . Calcium Citrate-Vitamin D (CALCIUM CITRATE + D PO) Take 1 tablet by mouth 2 (two) times daily.     Marland Kitchen ibuprofen (ADVIL) 800 MG tablet Take 1 tablet (800 mg total) by mouth every 8 (eight) hours as needed. 30 tablet 0  . lidocaine-prilocaine (EMLA) cream Apply to affected area once 30 g 3  . LORazepam (ATIVAN) 0.5 MG tablet Take 1 tablet (0.5 mg total) by mouth at bedtime as needed (Nausea or vomiting). 30 tablet 0  . oxyCODONE (OXY IR/ROXICODONE) 5 MG immediate release tablet     . prochlorperazine (COMPAZINE) 10 MG tablet Take 1 tablet (10 mg total) by mouth every 6 (six) hours as needed (Nausea or vomiting). 30 tablet 1   No current facility-administered medications for this visit.    OBJECTIVE:  Vitals:   11/30/19 0906  BP: 109/66  Pulse: 70  Resp: 18  Temp: 98 F (36.7 C)  SpO2: 100%     Body mass index is 22.76 kg/m.   Wt Readings from Last 3 Encounters:  11/30/19 134 lb 11.2 oz (61.1 kg)  11/23/19 138  lb 8 oz (62.8 kg)  11/16/19 137 lb 1.6 oz (62.2 kg)  ECOG FS:1 - Symptomatic but completely ambulatory GENERAL: Patient is a well appearing female in no acute distress HEENT:  Sclerae anicteric. Mask in place. Neck is supple.  NODES:  No cervical, supraclavicular, or axillary lymphadenopathy palpated.  BREAST EXAM:  Soft mass in right breast, no progression noted.  I palpated the area just below port dressing and there is no warmth, or tenderness noted.  Very mild swelling noted,  LUNGS:  Clear to auscultation bilaterally.  No wheezes or rhonchi. HEART:  Regular rate and rhythm. No murmur appreciated. ABDOMEN:  Soft, nontender.  Positive, normoactive bowel sounds. No organomegaly palpated. MSK:  No focal spinal tenderness to palpation.  EXTREMITIES:  No peripheral edema.   SKIN:  Clear with no obvious rashes or skin changes. No nail dyscrasia. NEURO:  Nonfocal. Well  oriented.  Appropriate affect.    LAB RESULTS:  CMP     Component Value Date/Time   NA 139 11/30/2019 0829   K 4.1 11/30/2019 0829   CL 110 11/30/2019 0829   CO2 24 11/30/2019 0829   GLUCOSE 105 (H) 11/30/2019 0829   BUN 10 11/30/2019 0829   CREATININE 0.74 11/30/2019 0829   CREATININE 0.79 08/04/2019 1546   CREATININE 0.76 03/27/2015 1146   CALCIUM 8.8 (L) 11/30/2019 0829   PROT 6.2 (L) 11/30/2019 0829   ALBUMIN 3.6 11/30/2019 0829   AST 26 11/30/2019 0829   AST 17 08/04/2019 1546   ALT 40 11/30/2019 0829   ALT 21 08/04/2019 1546   ALKPHOS 75 11/30/2019 0829   BILITOT 0.4 11/30/2019 0829   BILITOT 0.2 (L) 08/04/2019 1546   GFRNONAA >60 11/30/2019 0829   GFRNONAA >60 08/04/2019 1546   GFRNONAA 87 03/27/2015 1146   GFRAA >60 11/30/2019 0829   GFRAA >60 08/04/2019 1546   GFRAA >89 03/27/2015 1146    No results found for: TOTALPROTELP, ALBUMINELP, A1GS, A2GS, BETS, BETA2SER, GAMS, MSPIKE, SPEI  Lab Results  Component Value Date   WBC 2.5 (L) 11/30/2019   NEUTROABS 1.5 (L) 11/30/2019   HGB 10.7 (L) 11/30/2019   HCT 32.2 (L) 11/30/2019   MCV 88.7 11/30/2019   PLT 129 (L) 11/30/2019    No results found for: LABCA2  No components found for: DGUYQI347  No results for input(s): INR in the last 168 hours.  No results found for: LABCA2  No results found for: CAN199  No results found for: QQV956  No results found for: LOV564  Lab Results  Component Value Date   CA2729 27.4 08/18/2019    No components found for: HGQUANT  No results found for: CEA1 / No results found for: CEA1   No results found for: AFPTUMOR  No results found for: CHROMOGRNA  No results found for: KPAFRELGTCHN, LAMBDASER, KAPLAMBRATIO (kappa/lambda light chains)  No results found for: HGBA, HGBA2QUANT, HGBFQUANT, HGBSQUAN (Hemoglobinopathy evaluation)   No results found for: LDH  Lab Results  Component Value Date   IRON 94 02/15/2015   TIBC 272 02/15/2015   IRONPCTSAT 35  02/15/2015   (Iron and TIBC)  Lab Results  Component Value Date   FERRITIN 70 02/15/2015    Urinalysis No results found for: COLORURINE, APPEARANCEUR, LABSPEC, PHURINE, GLUCOSEU, HGBUR, BILIRUBINUR, KETONESUR, PROTEINUR, UROBILINOGEN, NITRITE, LEUKOCYTESUR   STUDIES: US BREAST LTD UNI RIGHT INC AXILLA  Result Date: 11/01/2019 CLINICAL DATA:  Follow-up right breast cancer and metastatic adenopathy treated with neoadjuvant chemotherapy. EXAM: ULTRASOUND OF THE RIGHT BREAST  COMPARISON:  Previous right breast and axillary ultrasound performed at [facility] in [place], [place] on 07/07/2019. FINDINGS: Targeted ultrasound is performed and again demonstrates irregular hypoechoic areas extending from the right nipple to the right axilla with approximately 4 right axillary lymph nodes with abnormal cortical thickening. The cortical thickening has improved. Previously, the largest node had a maximum cortical thickness of 6.4 mm and currently has a maximum cortical thickness of 3.5 mm. A previously measured 4.5 x 3.0 x 1.7 cm irregular hypoechoic area in the 10 o'clock position of the right breast, 7 cm from the nipple, currently measures 4.5 x 2.6 x 0.9 cm. This corresponds to a palpable mass today and previously. A previously measured 3.3 x 2.2 x 1.6 cm irregular hypoechoic area in the right breast in the 12 o'clock position, 1 cm from the nipple currently measures 1.5 x 1.1 x 0.9 cm. No new abnormalities are seen. IMPRESSION: Interval decrease in size of the irregular hypoechoic areas in the right breast and right axillary lymph nodes with cortical thickening, as described above. RECOMMENDATION: Treatment plan. I have discussed the findings and recommendations with the patient. If applicable, a reminder letter will be sent to the patient regarding the next appointment. BI-RADS CATEGORY  6: Known biopsy-proven malignancy. Electronically Signed   By: Claudie Revering M.D.   On:  11/01/2019 17:05    ELIGIBLE FOR AVAILABLE RESEARCH PROTOCOL:   ASSESSMENT: 64 y.o. [place], [place] woman status post right breast biopsy x2 on 07/13/2019 for a clinical T3-T4 N1-2, stage III invasive ductal carcinoma, grade 2, estrogen receptor positive, progesterone receptor variable, HER-2 not amplified.  (a) breast MRI 08/15/2019 showed a 7.6 area of enhancement, and 5 suspicious right axillary lymph nodes as well as two 0.5 mm additional masses in the inferior right breast   (1) neoadjuvant chemotherapy consisting of cyclophosphamide and doxorubicin in dose dense fashion x4 starting 08/18/2019, completed 09/28/2019, followed by weekly paclitaxel x12 starting 10/12/2019  (2) definitive surgery (right modified radical mastectomy) to follow  (3) adjuvant radiation as appropriate  (4) antiestrogens to start at the completion of local treatment   PLAN: Paige Gilbert is doing well today.  She will continue on weekly Paclitaxel as she is tolerating it well with minimal difficulties.    For her mouth soreness, I offered her magic mouthwash.  She would like to hold off on this for now.    Her port site is stable right now, and will hopefully improve.  If it becomes more swollen, or if she develops erythema or other infection signs she will call us so we can promptly evaluate.   She was recommended to continue with the appropriate pandemic precautions. She knows to call for any questions that may arise between now and her next appointment.  We are happy to see her sooner if needed.   Total encounter time 20 minutes.Wilber Bihari, NP 11/30/19 9:36 AM Medical Oncology and Hematology [facility] [place], [place] [address] Tel. [phone]    Fax. [phone]  *Total Encounter Time as defined by the Centers for Medicare and Medicaid Services includes, in addition to the face-to-face time of a patient visit (documented in the note above) non-face-to-face time:  obtaining and reviewing outside history, ordering and reviewing medications, tests or procedures, care coordination (communications with other health care professionals or caregivers) and documentation in the medical record.

## 2019-11-30 ENCOUNTER — Inpatient Hospital Stay: Payer: Managed Care, Other (non HMO)

## 2019-11-30 ENCOUNTER — Inpatient Hospital Stay: Payer: Managed Care, Other (non HMO) | Attending: Oncology

## 2019-11-30 ENCOUNTER — Other Ambulatory Visit: Payer: Self-pay

## 2019-11-30 ENCOUNTER — Ambulatory Visit: Payer: Managed Care, Other (non HMO) | Admitting: Adult Health

## 2019-11-30 ENCOUNTER — Encounter: Payer: Self-pay | Admitting: *Deleted

## 2019-11-30 ENCOUNTER — Other Ambulatory Visit: Payer: Managed Care, Other (non HMO)

## 2019-11-30 ENCOUNTER — Inpatient Hospital Stay (HOSPITAL_BASED_OUTPATIENT_CLINIC_OR_DEPARTMENT_OTHER): Payer: Managed Care, Other (non HMO) | Admitting: Adult Health

## 2019-11-30 ENCOUNTER — Ambulatory Visit: Payer: Managed Care, Other (non HMO)

## 2019-11-30 ENCOUNTER — Encounter: Payer: Self-pay | Admitting: Adult Health

## 2019-11-30 VITALS — BP 109/66 | HR 70 | Temp 98.0°F | Resp 18 | Ht 64.5 in | Wt 134.7 lb

## 2019-11-30 DIAGNOSIS — Z170 Estrogen receptor positive status [ER+]: Secondary | ICD-10-CM

## 2019-11-30 DIAGNOSIS — C50411 Malignant neoplasm of upper-outer quadrant of right female breast: Secondary | ICD-10-CM | POA: Diagnosis not present

## 2019-11-30 DIAGNOSIS — Z5111 Encounter for antineoplastic chemotherapy: Secondary | ICD-10-CM | POA: Diagnosis not present

## 2019-11-30 LAB — CBC WITH DIFFERENTIAL/PLATELET
Abs Immature Granulocytes: 0.01 10*3/uL (ref 0.00–0.07)
Basophils Absolute: 0 10*3/uL (ref 0.0–0.1)
Basophils Relative: 1 %
Eosinophils Absolute: 0.1 10*3/uL (ref 0.0–0.5)
Eosinophils Relative: 5 %
HCT: 32.2 % — ABNORMAL LOW (ref 36.0–46.0)
Hemoglobin: 10.7 g/dL — ABNORMAL LOW (ref 12.0–15.0)
Immature Granulocytes: 0 %
Lymphocytes Relative: 28 %
Lymphs Abs: 0.7 10*3/uL (ref 0.7–4.0)
MCH: 29.5 pg (ref 26.0–34.0)
MCHC: 33.2 g/dL (ref 30.0–36.0)
MCV: 88.7 fL (ref 80.0–100.0)
Monocytes Absolute: 0.2 10*3/uL (ref 0.1–1.0)
Monocytes Relative: 7 %
Neutro Abs: 1.5 10*3/uL — ABNORMAL LOW (ref 1.7–7.7)
Neutrophils Relative %: 59 %
Platelets: 129 10*3/uL — ABNORMAL LOW (ref 150–400)
RBC: 3.63 MIL/uL — ABNORMAL LOW (ref 3.87–5.11)
RDW: 12.5 % (ref 11.5–15.5)
WBC: 2.5 10*3/uL — ABNORMAL LOW (ref 4.0–10.5)
nRBC: 0 % (ref 0.0–0.2)

## 2019-11-30 LAB — COMPREHENSIVE METABOLIC PANEL
ALT: 40 U/L (ref 0–44)
AST: 26 U/L (ref 15–41)
Albumin: 3.6 g/dL (ref 3.5–5.0)
Alkaline Phosphatase: 75 U/L (ref 38–126)
Anion gap: 5 (ref 5–15)
BUN: 10 mg/dL (ref 8–23)
CO2: 24 mmol/L (ref 22–32)
Calcium: 8.8 mg/dL — ABNORMAL LOW (ref 8.9–10.3)
Chloride: 110 mmol/L (ref 98–111)
Creatinine, Ser: 0.74 mg/dL (ref 0.44–1.00)
GFR calc Af Amer: >60 mL/min (ref >60)
GFR calc non Af Amer: >60 mL/min (ref >60)
Glucose, Bld: 105 mg/dL — ABNORMAL HIGH (ref 70–99)
Potassium: 4.1 mmol/L (ref 3.5–5.1)
Sodium: 139 mmol/L (ref 135–145)
Total Bilirubin: 0.4 mg/dL (ref 0.3–1.2)
Total Protein: 6.2 g/dL — ABNORMAL LOW (ref 6.5–8.1)

## 2019-11-30 MED ORDER — FAMOTIDINE IN NACL 20-0.9 MG/50ML-% IV SOLN
INTRAVENOUS | Status: AC
  Filled 2019-11-30: qty 50

## 2019-11-30 MED ORDER — DEXAMETHASONE SODIUM PHOSPHATE 10 MG/ML IJ SOLN
INTRAMUSCULAR | Status: AC
  Filled 2019-11-30: qty 1

## 2019-11-30 MED ORDER — FAMOTIDINE IN NACL 20-0.9 MG/50ML-% IV SOLN
20.0000 mg | Freq: Once | INTRAVENOUS | Status: AC
  Administered 2019-11-30: 20 mg via INTRAVENOUS

## 2019-11-30 MED ORDER — SODIUM CHLORIDE 0.9% FLUSH
10.0000 mL | INTRAVENOUS | Status: DC | PRN
  Administered 2019-11-30: 10 mL
  Filled 2019-11-30: qty 10

## 2019-11-30 MED ORDER — HEPARIN SOD (PORK) LOCK FLUSH 100 UNIT/ML IV SOLN
500.0000 [IU] | Freq: Once | INTRAVENOUS | Status: AC | PRN
  Administered 2019-11-30: 500 [IU]
  Filled 2019-11-30: qty 5

## 2019-11-30 MED ORDER — SODIUM CHLORIDE 0.9 % IV SOLN
Freq: Once | INTRAVENOUS | Status: AC
  Filled 2019-11-30: qty 250

## 2019-11-30 MED ORDER — DIPHENHYDRAMINE HCL 50 MG/ML IJ SOLN
25.0000 mg | Freq: Once | INTRAMUSCULAR | Status: AC
  Administered 2019-11-30: 25 mg via INTRAVENOUS

## 2019-11-30 MED ORDER — DEXAMETHASONE SODIUM PHOSPHATE 10 MG/ML IJ SOLN
4.0000 mg | Freq: Once | INTRAMUSCULAR | Status: AC
  Administered 2019-11-30: 10:00:00 4 mg via INTRAVENOUS

## 2019-11-30 MED ORDER — DIPHENHYDRAMINE HCL 50 MG/ML IJ SOLN
INTRAMUSCULAR | Status: AC
  Filled 2019-11-30: qty 1

## 2019-11-30 MED ORDER — SODIUM CHLORIDE 0.9 % IV SOLN
80.0000 mg/m2 | Freq: Once | INTRAVENOUS | Status: AC
  Administered 2019-11-30: 11:00:00 138 mg via INTRAVENOUS
  Filled 2019-11-30: qty 23

## 2019-11-30 NOTE — Patient Instructions (Signed)
[facility] Discharge Instructions for Patients Receiving Chemotherapy  Today you received the following chemotherapy agents Paclitaxel (TAXOL).  To help prevent nausea and vomiting after your treatment, we encourage you to take your nausea medication as prescribed.  If you develop nausea and vomiting that is not controlled by your nausea medication, call the clinic.   BELOW ARE SYMPTOMS THAT SHOULD BE REPORTED IMMEDIATELY:  *FEVER GREATER THAN 100.5 F  *CHILLS WITH OR WITHOUT FEVER  NAUSEA AND VOMITING THAT IS NOT CONTROLLED WITH YOUR NAUSEA MEDICATION  *UNUSUAL SHORTNESS OF BREATH  *UNUSUAL BRUISING OR BLEEDING  TENDERNESS IN MOUTH AND THROAT WITH OR WITHOUT PRESENCE OF ULCERS  *URINARY PROBLEMS  *BOWEL PROBLEMS  UNUSUAL RASH Items with * indicate a potential emergency and should be followed up as soon as possible.  Feel free to call the clinic should you have any questions or concerns. The clinic phone number is [phone].  Please show the CHEMO ALERT CARD at check-in to the Emergency Department and triage nurse.   

## 2019-11-30 NOTE — Progress Notes (Signed)
Attempted to call pt x2 to discuss general sx info on timing and recovery. Phone disconnected after 2 rings each time.

## 2019-12-01 ENCOUNTER — Telehealth: Payer: Self-pay | Admitting: Oncology

## 2019-12-01 NOTE — Telephone Encounter (Signed)
No 5/4 los. No changes made to pt's schedule.  

## 2019-12-01 NOTE — Progress Notes (Signed)
Pharmacist Chemotherapy Monitoring - Follow Up Assessment    I verify that I have reviewed each item in the below checklist:  . Regimen for the patient is scheduled for the appropriate day and plan matches scheduled date. Marland Kitchen Appropriate non-routine labs are ordered dependent on drug ordered. . If applicable, additional medications reviewed and ordered per protocol based on lifetime cumulative doses and/or treatment regimen.   Plan for follow-up and/or issues identified: No . I-vent associated with next due treatment: No . MD and/or nursing notified: No  Paige Gilbert [facility] 12/01/2019 9:06 AM

## 2019-12-06 ENCOUNTER — Telehealth: Payer: Self-pay | Admitting: *Deleted

## 2019-12-06 NOTE — Telephone Encounter (Signed)
VM left by the patient stating she wanted to make sure it was ok to take her amoxil for a dental appointment " wanted to make sure it didn't interfere with the chemo scheduled tomorrow"  This RN called pt back - obtained her VM- message left ok to take Amoxil- and requested a return call to further discuss dental procedure.

## 2019-12-07 ENCOUNTER — Other Ambulatory Visit: Payer: Self-pay

## 2019-12-07 ENCOUNTER — Inpatient Hospital Stay: Payer: Managed Care, Other (non HMO)

## 2019-12-07 VITALS — BP 104/61 | HR 69 | Temp 98.5°F | Resp 18

## 2019-12-07 DIAGNOSIS — Z170 Estrogen receptor positive status [ER+]: Secondary | ICD-10-CM

## 2019-12-07 DIAGNOSIS — C50411 Malignant neoplasm of upper-outer quadrant of right female breast: Secondary | ICD-10-CM

## 2019-12-07 DIAGNOSIS — Z5111 Encounter for antineoplastic chemotherapy: Secondary | ICD-10-CM | POA: Diagnosis not present

## 2019-12-07 LAB — COMPREHENSIVE METABOLIC PANEL
ALT: 50 U/L — ABNORMAL HIGH (ref 0–44)
AST: 33 U/L (ref 15–41)
Albumin: 3.8 g/dL (ref 3.5–5.0)
Alkaline Phosphatase: 76 U/L (ref 38–126)
Anion gap: 8 (ref 5–15)
BUN: 12 mg/dL (ref 8–23)
CO2: 22 mmol/L (ref 22–32)
Calcium: 8.6 mg/dL — ABNORMAL LOW (ref 8.9–10.3)
Chloride: 108 mmol/L (ref 98–111)
Creatinine, Ser: 0.67 mg/dL (ref 0.44–1.00)
GFR calc Af Amer: >60 mL/min (ref >60)
GFR calc non Af Amer: >60 mL/min (ref >60)
Glucose, Bld: 94 mg/dL (ref 70–99)
Potassium: 4.1 mmol/L (ref 3.5–5.1)
Sodium: 138 mmol/L (ref 135–145)
Total Bilirubin: 0.4 mg/dL (ref 0.3–1.2)
Total Protein: 6.4 g/dL — ABNORMAL LOW (ref 6.5–8.1)

## 2019-12-07 LAB — CBC WITH DIFFERENTIAL/PLATELET
Abs Immature Granulocytes: 0.01 10*3/uL (ref 0.00–0.07)
Basophils Absolute: 0 10*3/uL (ref 0.0–0.1)
Basophils Relative: 1 %
Eosinophils Absolute: 0.1 10*3/uL (ref 0.0–0.5)
Eosinophils Relative: 2 %
HCT: 34 % — ABNORMAL LOW (ref 36.0–46.0)
Hemoglobin: 11.4 g/dL — ABNORMAL LOW (ref 12.0–15.0)
Immature Granulocytes: 0 %
Lymphocytes Relative: 34 %
Lymphs Abs: 0.8 10*3/uL (ref 0.7–4.0)
MCH: 30.1 pg (ref 26.0–34.0)
MCHC: 33.5 g/dL (ref 30.0–36.0)
MCV: 89.7 fL (ref 80.0–100.0)
Monocytes Absolute: 0.2 10*3/uL (ref 0.1–1.0)
Monocytes Relative: 9 %
Neutro Abs: 1.3 10*3/uL — ABNORMAL LOW (ref 1.7–7.7)
Neutrophils Relative %: 54 %
Platelets: 182 10*3/uL (ref 150–400)
RBC: 3.79 MIL/uL — ABNORMAL LOW (ref 3.87–5.11)
RDW: 12.7 % (ref 11.5–15.5)
WBC: 2.4 10*3/uL — ABNORMAL LOW (ref 4.0–10.5)
nRBC: 0 % (ref 0.0–0.2)

## 2019-12-07 MED ORDER — FAMOTIDINE IN NACL 20-0.9 MG/50ML-% IV SOLN
INTRAVENOUS | Status: AC
  Filled 2019-12-07: qty 50

## 2019-12-07 MED ORDER — SODIUM CHLORIDE 0.9 % IV SOLN
Freq: Once | INTRAVENOUS | Status: AC
  Filled 2019-12-07: qty 250

## 2019-12-07 MED ORDER — HEPARIN SOD (PORK) LOCK FLUSH 100 UNIT/ML IV SOLN
500.0000 [IU] | Freq: Once | INTRAVENOUS | Status: AC | PRN
  Administered 2019-12-07: 500 [IU]
  Filled 2019-12-07: qty 5

## 2019-12-07 MED ORDER — DIPHENHYDRAMINE HCL 50 MG/ML IJ SOLN
25.0000 mg | Freq: Once | INTRAMUSCULAR | Status: AC
  Administered 2019-12-07: 25 mg via INTRAVENOUS

## 2019-12-07 MED ORDER — DEXAMETHASONE SODIUM PHOSPHATE 10 MG/ML IJ SOLN
4.0000 mg | Freq: Once | INTRAMUSCULAR | Status: AC
  Administered 2019-12-07: 4 mg via INTRAVENOUS

## 2019-12-07 MED ORDER — SODIUM CHLORIDE 0.9% FLUSH
10.0000 mL | INTRAVENOUS | Status: DC | PRN
  Administered 2019-12-07: 10 mL
  Filled 2019-12-07: qty 10

## 2019-12-07 MED ORDER — FAMOTIDINE IN NACL 20-0.9 MG/50ML-% IV SOLN
20.0000 mg | Freq: Once | INTRAVENOUS | Status: AC
  Administered 2019-12-07: 20 mg via INTRAVENOUS

## 2019-12-07 MED ORDER — SODIUM CHLORIDE 0.9 % IV SOLN
80.0000 mg/m2 | Freq: Once | INTRAVENOUS | Status: AC
  Administered 2019-12-07: 138 mg via INTRAVENOUS
  Filled 2019-12-07: qty 23

## 2019-12-07 MED ORDER — DEXAMETHASONE SODIUM PHOSPHATE 10 MG/ML IJ SOLN
INTRAMUSCULAR | Status: AC
  Filled 2019-12-07: qty 1

## 2019-12-07 MED ORDER — DIPHENHYDRAMINE HCL 50 MG/ML IJ SOLN
INTRAMUSCULAR | Status: AC
  Filled 2019-12-07: qty 1

## 2019-12-07 NOTE — Patient Instructions (Signed)

## 2019-12-07 NOTE — Progress Notes (Signed)
Pt ANC today is 1.3 - ok to treat per Dr. Jana Hakim.

## 2019-12-07 NOTE — Patient Instructions (Signed)
[facility] Discharge Instructions for Patients Receiving Chemotherapy  Today you received the following chemotherapy agents Paclitaxel (TAXOL).  To help prevent nausea and vomiting after your treatment, we encourage you to take your nausea medication as prescribed.  If you develop nausea and vomiting that is not controlled by your nausea medication, call the clinic.   BELOW ARE SYMPTOMS THAT SHOULD BE REPORTED IMMEDIATELY:  *FEVER GREATER THAN 100.5 F  *CHILLS WITH OR WITHOUT FEVER  NAUSEA AND VOMITING THAT IS NOT CONTROLLED WITH YOUR NAUSEA MEDICATION  *UNUSUAL SHORTNESS OF BREATH  *UNUSUAL BRUISING OR BLEEDING  TENDERNESS IN MOUTH AND THROAT WITH OR WITHOUT PRESENCE OF ULCERS  *URINARY PROBLEMS  *BOWEL PROBLEMS  UNUSUAL RASH Items with * indicate a potential emergency and should be followed up as soon as possible.  Feel free to call the clinic should you have any questions or concerns. The clinic phone number is [phone].  Please show the CHEMO ALERT CARD at check-in to the Emergency Department and triage nurse.   

## 2019-12-08 NOTE — Progress Notes (Signed)
Pharmacist Chemotherapy Monitoring - Follow Up Assessment    I verify that I have reviewed each item in the below checklist:  . Regimen for the patient is scheduled for the appropriate day and plan matches scheduled date. Marland Kitchen Appropriate non-routine labs are ordered dependent on drug ordered. . If applicable, additional medications reviewed and ordered per protocol based on lifetime cumulative doses and/or treatment regimen.   Plan for follow-up and/or issues identified: No . I-vent associated with next due treatment: No . MD and/or nursing notified: No  Philomena Course 12/08/2019 9:18 AM

## 2019-12-14 ENCOUNTER — Inpatient Hospital Stay: Payer: Managed Care, Other (non HMO)

## 2019-12-14 ENCOUNTER — Encounter: Payer: Self-pay | Admitting: Adult Health

## 2019-12-14 ENCOUNTER — Other Ambulatory Visit: Payer: Self-pay

## 2019-12-14 ENCOUNTER — Inpatient Hospital Stay: Payer: Managed Care, Other (non HMO) | Admitting: Adult Health

## 2019-12-14 ENCOUNTER — Encounter: Payer: Self-pay | Admitting: *Deleted

## 2019-12-14 VITALS — BP 107/66 | HR 77 | Temp 98.5°F | Resp 18 | Ht 64.5 in | Wt 137.6 lb

## 2019-12-14 DIAGNOSIS — C50411 Malignant neoplasm of upper-outer quadrant of right female breast: Secondary | ICD-10-CM

## 2019-12-14 DIAGNOSIS — Z170 Estrogen receptor positive status [ER+]: Secondary | ICD-10-CM

## 2019-12-14 DIAGNOSIS — Z5111 Encounter for antineoplastic chemotherapy: Secondary | ICD-10-CM | POA: Diagnosis not present

## 2019-12-14 LAB — COMPREHENSIVE METABOLIC PANEL
ALT: 36 U/L (ref 0–44)
AST: 23 U/L (ref 15–41)
Albumin: 3.7 g/dL (ref 3.5–5.0)
Alkaline Phosphatase: 68 U/L (ref 38–126)
Anion gap: 9 (ref 5–15)
BUN: 13 mg/dL (ref 8–23)
CO2: 23 mmol/L (ref 22–32)
Calcium: 8.6 mg/dL — ABNORMAL LOW (ref 8.9–10.3)
Chloride: 108 mmol/L (ref 98–111)
Creatinine, Ser: 0.68 mg/dL (ref 0.44–1.00)
GFR calc Af Amer: >60 mL/min (ref >60)
GFR calc non Af Amer: >60 mL/min (ref >60)
Glucose, Bld: 105 mg/dL — ABNORMAL HIGH (ref 70–99)
Potassium: 4 mmol/L (ref 3.5–5.1)
Sodium: 140 mmol/L (ref 135–145)
Total Bilirubin: 0.3 mg/dL (ref 0.3–1.2)
Total Protein: 6 g/dL — ABNORMAL LOW (ref 6.5–8.1)

## 2019-12-14 LAB — CBC WITH DIFFERENTIAL/PLATELET
Abs Immature Granulocytes: 0.01 10*3/uL (ref 0.00–0.07)
Basophils Absolute: 0 10*3/uL (ref 0.0–0.1)
Basophils Relative: 1 %
Eosinophils Absolute: 0.1 10*3/uL (ref 0.0–0.5)
Eosinophils Relative: 2 %
HCT: 33.2 % — ABNORMAL LOW (ref 36.0–46.0)
Hemoglobin: 11.1 g/dL — ABNORMAL LOW (ref 12.0–15.0)
Immature Granulocytes: 0 %
Lymphocytes Relative: 26 %
Lymphs Abs: 0.8 10*3/uL (ref 0.7–4.0)
MCH: 30 pg (ref 26.0–34.0)
MCHC: 33.4 g/dL (ref 30.0–36.0)
MCV: 89.7 fL (ref 80.0–100.0)
Monocytes Absolute: 0.2 10*3/uL (ref 0.1–1.0)
Monocytes Relative: 6 %
Neutro Abs: 2.1 10*3/uL (ref 1.7–7.7)
Neutrophils Relative %: 65 %
Platelets: 171 10*3/uL (ref 150–400)
RBC: 3.7 MIL/uL — ABNORMAL LOW (ref 3.87–5.11)
RDW: 12.9 % (ref 11.5–15.5)
WBC: 3.2 10*3/uL — ABNORMAL LOW (ref 4.0–10.5)
nRBC: 0 % (ref 0.0–0.2)

## 2019-12-14 MED ORDER — SODIUM CHLORIDE 0.9 % IV SOLN
Freq: Once | INTRAVENOUS | Status: AC
  Filled 2019-12-14: qty 250

## 2019-12-14 MED ORDER — SODIUM CHLORIDE 0.9 % IV SOLN
80.0000 mg/m2 | Freq: Once | INTRAVENOUS | Status: AC
  Administered 2019-12-14: 138 mg via INTRAVENOUS
  Filled 2019-12-14: qty 23

## 2019-12-14 MED ORDER — HEPARIN SOD (PORK) LOCK FLUSH 100 UNIT/ML IV SOLN
500.0000 [IU] | Freq: Once | INTRAVENOUS | Status: AC | PRN
  Administered 2019-12-14: 500 [IU]
  Filled 2019-12-14: qty 5

## 2019-12-14 MED ORDER — SODIUM CHLORIDE 0.9% FLUSH
10.0000 mL | INTRAVENOUS | Status: DC | PRN
  Administered 2019-12-14: 10 mL
  Filled 2019-12-14: qty 10

## 2019-12-14 MED ORDER — DEXAMETHASONE SODIUM PHOSPHATE 10 MG/ML IJ SOLN
4.0000 mg | Freq: Once | INTRAMUSCULAR | Status: AC
Start: 2019-12-14 — End: 2019-12-14
  Administered 2019-12-14: 4 mg via INTRAVENOUS

## 2019-12-14 MED ORDER — FAMOTIDINE IN NACL 20-0.9 MG/50ML-% IV SOLN
INTRAVENOUS | Status: AC
  Filled 2019-12-14: qty 50

## 2019-12-14 MED ORDER — DIPHENHYDRAMINE HCL 50 MG/ML IJ SOLN
INTRAMUSCULAR | Status: AC
  Filled 2019-12-14: qty 1

## 2019-12-14 MED ORDER — DIPHENHYDRAMINE HCL 50 MG/ML IJ SOLN
25.0000 mg | Freq: Once | INTRAMUSCULAR | Status: AC
  Administered 2019-12-14: 25 mg via INTRAVENOUS

## 2019-12-14 MED ORDER — FAMOTIDINE IN NACL 20-0.9 MG/50ML-% IV SOLN
20.0000 mg | Freq: Once | INTRAVENOUS | Status: AC
  Administered 2019-12-14: 20 mg via INTRAVENOUS

## 2019-12-14 MED ORDER — DEXAMETHASONE SODIUM PHOSPHATE 10 MG/ML IJ SOLN
INTRAMUSCULAR | Status: AC
  Filled 2019-12-14: qty 1

## 2019-12-14 NOTE — Progress Notes (Signed)
Paige Gilbert  [phone] Fax:([phone]) [phone]     ID: Paige Gilbert DOB: 19-Sep-1955  MR#: [id number]  IBB#:[id number]  Patient Care Team: [name], NP as PCP - General (Family Medicine) [name], RN as Oncology Nurse Navigator [name], RN as Oncology Nurse Navigator Magrinat, [name], MD as Consulting Physician (Oncology) [name], MD as Attending Physician (Radiation Oncology) [name], MD as Consulting Physician (General Surgery) Scot Dock, NP OTHER MD:  CHIEF COMPLAINT: Locally advanced estrogen receptor positive breast cancer  CURRENT TREATMENT: Neoadjuvant chemotherapy   INTERVAL HISTORY: Paige Gilbert returns today for follow up and treatment of her locally advanced breast cancer.  She completed her first 4 cycles of intensive doxorubicin and cyclophosphamide.  She has gone on to receive Paclitaxel given weekly x 12 cycles.  Today is week 8 of 12 planned treatments.  She has no peripheral neuropathy.  She underwent right breast ultrasound on 11/01/2019.  This shows a good initial response to chemotherapy, approximately 40% reduction.   REVIEW OF SYSTEMS: Tharon is doing well today.  She denies any new issues.  She has maintained a good activity level, and notes that it does contribute to some fatigue.  She denies any new issues like fever, chills, chest pain, palpitations, cough, nausea, vomiting, bowel/bladder changes, mucositis, or any other concerns.  A detailed ROS Was otherwise non contributory.    HISTORY OF CURRENT ILLNESS: From the original intake note:  Paige Gilbert has noted a difference between her right and left breast for several years.  She last had a mammogram she thinks about 7 or 8 years ago.  More recently it seemed to her that her right breast was becoming more firm.  She brought it to medical attention and underwent bilateral diagnostic mammography with tomography and right breast  ultrasonography at [facility] on 07/07/2019 showing: heterogeneously dense breast tissue; right nipple retraction; architectural distortion with microcalcifications and irregular dense tissue in the upper-outer quadrant; at least 3 abnormal right axillary lymph nodes.  Accordingly on 07/13/2019 she proceeded to biopsy of the right breast area in question. The pathology from this procedure ([id number]) showed:   1. Right Breast, upper-outer quadrant, closer to the nipple  - invasive ductal carcinoma with micropapillary features, grade 2  - ductal carcinoma in situ, grade 2-3, solid and cribriform pattern with comedonecrosis  - Prognostic indicators significant for: estrogen receptor, 100% positive with strong staining intensity and progesterone receptor, 0% negative. HER2 equivocal by immunohistochemistry (2+), but negative by fluorescent in situ hybridization with a signals ratio 1.5 and number per cell 3.8.  2. Right Breast, upper-outer quadrant, axillary tail  - invasive ductal carcinoma, grade 2  - ductal carcinoma in situ, grade 2-3  - Prognostic indicators significant for: estrogen receptor, 100% positive and progesterone receptor, 50% positive, both with strong staining intensity. HER2 equivocal by immunohistochemistry (2+), but negative by fluorescent in situ hybridization with a signals ratio 1.9 and number per cell 5.3.  The patient's subsequent history is as detailed below.   PAST MEDICAL HISTORY: Past Medical History:  Diagnosis Date  . Anemia   . right breast ca dx'd 06/2019  . Varicose vein   . Varicose veins of left lower extremity    "bluging ones at top- no pain"    PAST SURGICAL HISTORY: Past Surgical History:  Procedure Laterality Date  . NO PAST SURGERIES    . none    . PORTACATH PLACEMENT Right 08/17/2019   Procedure: INSERTION PORT-A-CATH  WITH ULTRASOUND;  Surgeon: [name], MD;  Location: [facility];  Service: General;  Laterality: Right;  . wisdom teeth  removal     remotely.     FAMILY HISTORY: Family History  Problem Relation Age of Onset  . Diabetes Father   . Blindness Mother   . Congestive Heart Failure Sister   . Gallbladder disease Sister        removed  . Diabetes Sister   . Colitis Sister    The patient's father died at age 63 from a stroke.  The patient's mother died from multiple medical problems not related to cancer at age 63.  The patient had 4 brothers, 7 sisters.  There is no breast ovarian or prostate cancer in the family to her knowledge.  She had one cousin with pancreatic cancer.   GYNECOLOGIC HISTORY:  No LMP recorded. Patient is postmenopausal. Menarche: 64 years old Age at first live birth: 64 years old [place] P 5 LMP 47 Contraceptive: Brief use of oral contraceptives as a teenager HRT no Hysterectomy?  No BSO?    SOCIAL HISTORY: (updated 07/2019)  Axie works in a Engineer, agricultural, and Education administrator houses.  Her husband of 37 years [name] is a Chief Operating Officer.  Their children are Earlie Gilbert, 63, who lives in [place] and is a Veterinary surgeon in the [employer]; Paige Gilbert 33 who works as a Designer, jewellery at [employer]; Paige Gilbert who lives in [place], and is not currently employed; Paige Gilbert lives in [place], and Paige Gilbert who is an Armed forces training and education officer in [place].  The patient has 4 grandchildren all under the age of 75    ADVANCED DIRECTIVES: Husband [name] is her HCPOA.   HEALTH MAINTENANCE: Social History   Tobacco Use  . Smoking status: Never Smoker  . Smokeless tobacco: Never Used  Substance Use Topics  . Alcohol use: Yes    Comment: rarely  . Drug use: No     Colonoscopy: never done  PAP: Remote  Bone density: Never   No Known Allergies  Current Outpatient Medications  Medication Sig Dispense Refill  . Calcium Citrate-Vitamin D (CALCIUM CITRATE + D PO) Take 1 tablet by mouth 2 (two) times daily.     Marland Kitchen ibuprofen (ADVIL) 800 MG tablet Take 1 tablet (800 mg total) by mouth  every 8 (eight) hours as needed. 30 tablet 0  . lidocaine-prilocaine (EMLA) cream Apply to affected area once 30 g 3  . LORazepam (ATIVAN) 0.5 MG tablet Take 1 tablet (0.5 mg total) by mouth at bedtime as needed (Nausea or vomiting). 30 tablet 0  . oxyCODONE (OXY IR/ROXICODONE) 5 MG immediate release tablet     . prochlorperazine (COMPAZINE) 10 MG tablet Take 1 tablet (10 mg total) by mouth every 6 (six) hours as needed (Nausea or vomiting). 30 tablet 1   No current facility-administered medications for this visit.    OBJECTIVE:  Vitals:   12/14/19 1155  BP: 107/66  Pulse: 77  Resp: 18  Temp: 98.5 F (36.9 C)  SpO2: 100%     Body mass index is 23.25 kg/m.   Wt Readings from Last 3 Encounters:  12/14/19 137 lb 9.6 oz (62.4 kg)  11/30/19 134 lb 11.2 oz (61.1 kg)  11/23/19 138 lb 8 oz (62.8 kg)  ECOG FS:1 - Symptomatic but completely ambulatory GENERAL: Patient is a well appearing female in no acute distress HEENT:  Sclerae anicteric. Mask in place. Neck is supple.  NODES:  No cervical, supraclavicular, or axillary lymphadenopathy  palpated.  BREAST EXAM:  Soft mass in right upper outer breast, no progression noted. LUNGS:  Clear to auscultation bilaterally.  No wheezes or rhonchi. HEART:  Regular rate and rhythm. No murmur appreciated. ABDOMEN:  Soft, nontender.  Positive, normoactive bowel sounds. No organomegaly palpated. MSK:  No focal spinal tenderness to palpation.  EXTREMITIES:  No peripheral edema.   SKIN:  Clear with no obvious rashes or skin changes. No nail dyscrasia. NEURO:  Nonfocal. Well oriented.  Appropriate affect.    LAB RESULTS:  CMP     Component Value Date/Time   NA 140 12/14/2019 1142   K 4.0 12/14/2019 1142   CL 108 12/14/2019 1142   CO2 23 12/14/2019 1142   GLUCOSE 105 (H) 12/14/2019 1142   BUN 13 12/14/2019 1142   CREATININE 0.68 12/14/2019 1142   CREATININE 0.79 08/04/2019 1546   CREATININE 0.76 03/27/2015 1146   CALCIUM 8.6 (L) 12/14/2019  1142   PROT 6.0 (L) 12/14/2019 1142   ALBUMIN 3.7 12/14/2019 1142   AST 23 12/14/2019 1142   AST 17 08/04/2019 1546   ALT 36 12/14/2019 1142   ALT 21 08/04/2019 1546   ALKPHOS 68 12/14/2019 1142   BILITOT 0.3 12/14/2019 1142   BILITOT 0.2 (L) 08/04/2019 1546   GFRNONAA >60 12/14/2019 1142   GFRNONAA >60 08/04/2019 1546   GFRNONAA 87 03/27/2015 1146   GFRAA >60 12/14/2019 1142   GFRAA >60 08/04/2019 1546   GFRAA >89 03/27/2015 1146    No results found for: TOTALPROTELP, ALBUMINELP, A1GS, A2GS, BETS, BETA2SER, GAMS, MSPIKE, SPEI  Lab Results  Component Value Date   WBC 3.2 (L) 12/14/2019   NEUTROABS 2.1 12/14/2019   HGB 11.1 (L) 12/14/2019   HCT 33.2 (L) 12/14/2019   MCV 89.7 12/14/2019   PLT 171 12/14/2019    No results found for: LABCA2  No components found for: XQJJHE174  No results for input(s): INR in the last 168 hours.  No results found for: LABCA2  No results found for: YCX448  No results found for: JEH631  No results found for: SHF026  Lab Results  Component Value Date   CA2729 27.4 08/18/2019    No components found for: HGQUANT  No results found for: CEA1 / No results found for: CEA1   No results found for: AFPTUMOR  No results found for: CHROMOGRNA  No results found for: KPAFRELGTCHN, LAMBDASER, KAPLAMBRATIO (kappa/lambda light chains)  No results found for: HGBA, HGBA2QUANT, HGBFQUANT, HGBSQUAN (Hemoglobinopathy evaluation)   No results found for: LDH  Lab Results  Component Value Date   IRON 94 02/15/2015   TIBC 272 02/15/2015   IRONPCTSAT 35 02/15/2015   (Iron and TIBC)  Lab Results  Component Value Date   FERRITIN 70 02/15/2015    Urinalysis No results found for: COLORURINE, APPEARANCEUR, LABSPEC, PHURINE, GLUCOSEU, HGBUR, BILIRUBINUR, KETONESUR, PROTEINUR, UROBILINOGEN, NITRITE, LEUKOCYTESUR   STUDIES: No results found.  ELIGIBLE FOR AVAILABLE RESEARCH PROTOCOL:   ASSESSMENT: 64 y.o. [place], [place] woman status post  right breast biopsy x2 on 07/13/2019 for a clinical T3-T4 N1-2, stage III invasive ductal carcinoma, grade 2, estrogen receptor positive, progesterone receptor variable, HER-2 not amplified.  (a) breast MRI 08/15/2019 showed a 7.6 area of enhancement, and 5 suspicious right axillary lymph nodes as well as two 0.5 mm additional masses in the inferior right breast   (1) neoadjuvant chemotherapy consisting of cyclophosphamide and doxorubicin in dose dense fashion x4 starting 08/18/2019, completed 09/28/2019, followed by weekly paclitaxel x12 starting 10/12/2019  (  2) definitive surgery (right modified radical mastectomy) to follow  (3) adjuvant radiation as appropriate  (4) antiestrogens to start at the completion of local treatment   PLAN: Bexleigh continues on weekly Paclitaxel with good tolerance.  Her labs are not yet back today, however she will proceed with treatment so long as they are within parameters.  She is tolerating this well and has no evidence of peripheral neuropathy.    I reviewed with Wafaa that I am going to go ahead and order her post treatment MRI and start the discussion with Dr. Josetta Huddle CMA to work on coordinating scheduling of her f/u with Dr. Brantley Stage following the MRI.    Trena will return next week for labs, f/u and her next treatment.    She was recommended to continue with the appropriate pandemic precautions. She knows to call for any questions that may arise between now and her next appointment.  We are happy to see her sooner if needed.   Total encounter time 20 minutes.Wilber Bihari, NP 12/15/19 4:47 AM Medical Oncology and Hematology [facility] [place],  [address] Tel. [phone]    Fax. [phone]  *Total Encounter Time as defined by the Centers for Medicare and Medicaid Services includes, in addition to the face-to-face time of a patient visit (documented in the note above) non-face-to-face time:  obtaining and reviewing outside history, ordering and reviewing medications, tests or procedures, care coordination (communications with other health care professionals or caregivers) and documentation in the medical record.

## 2019-12-14 NOTE — Patient Instructions (Signed)

## 2019-12-15 ENCOUNTER — Encounter: Payer: Self-pay | Admitting: *Deleted

## 2019-12-15 ENCOUNTER — Telehealth: Payer: Self-pay | Admitting: Adult Health

## 2019-12-15 NOTE — Telephone Encounter (Signed)
No 5/18 los. No changes made to pt's schedule.  

## 2019-12-15 NOTE — Progress Notes (Signed)
Pharmacist Chemotherapy Monitoring - Follow Up Assessment    I verify that I have reviewed each item in the below checklist:  . Regimen for the patient is scheduled for the appropriate day and plan matches scheduled date. Marland Kitchen Appropriate non-routine labs are ordered dependent on drug ordered. . If applicable, additional medications reviewed and ordered per protocol based on lifetime cumulative doses and/or treatment regimen.   Plan for follow-up and/or issues identified: No . I-vent associated with next due treatment: No . MD and/or nursing notified: No  Alphonza Tramell D 12/15/2019 8:46 AM

## 2019-12-20 NOTE — Progress Notes (Signed)
[place]  [phone] [phone]     ID: Paige Gilbert DOB: [birth year]  MR#: [id number]  WER#:[id number]  Patient Care Team: [name], NP as PCP - General (Family Medicine) [name], RN as Oncology Nurse Navigator [name], RN as Oncology Nurse Navigator Cheila Wickstrom, [name], MD as Consulting Physician (Oncology) [name], MD as Attending Physician (Radiation Oncology) [name], MD as Consulting Physician (General Surgery) [name], MD OTHER MD:  CHIEF COMPLAINT: Locally advanced estrogen receptor positive breast cancer  CURRENT TREATMENT: Neoadjuvant chemotherapy   INTERVAL HISTORY: Paige Gilbert returns today for follow up and treatment of her locally advanced breast cancer.  She continues on weekly paclitaxel. Today is week 9 of 12 planned treatments.  She has no peripheral neuropathy.  She generally tolerates the treatments quite well.  She is scheduled for breast MRI on 01/04/2020.   REVIEW OF SYSTEMS: Paige Gilbert tells me that about a week ago she developed a little bit of low back pain.  She then had a sore throat and then about 3 days ago developed a little bit of cough and congestion.  She has been very careful to check and has not had any temperature, pleurisy, or significant phlegm production.  She has felt a little bit more tired than usual.  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  She has had no unusual headaches visual changes or problems with diarrhea or constipation.  There has been no rash.  A detailed review of systems was otherwise stable   HISTORY OF CURRENT ILLNESS: From the original intake note:  Paige Gilbert has noted a difference between her right and left breast for several years.  She last had a mammogram she thinks about 7 or 8 years ago.  More recently it seemed to her that her right breast was becoming more firm.  She brought it to medical attention  and underwent bilateral diagnostic mammography with tomography and right breast ultrasonography at [facility] on 07/07/2019 showing: heterogeneously dense breast tissue; right nipple retraction; architectural distortion with microcalcifications and irregular dense tissue in the upper-outer quadrant; at least 3 abnormal right axillary lymph nodes.  Accordingly on 07/13/2019 she proceeded to biopsy of the right breast area in question. The pathology from this procedure ([id number]) showed:   1. Right Breast, upper-outer quadrant, closer to the nipple  - invasive ductal carcinoma with micropapillary features, grade 2  - ductal carcinoma in situ, grade 2-3, solid and cribriform pattern with comedonecrosis  - Prognostic indicators significant for: estrogen receptor, 100% positive with strong staining intensity and progesterone receptor, 0% negative. HER2 equivocal by immunohistochemistry (2+), but negative by fluorescent in situ hybridization with a signals ratio 1.5 and number per cell 3.8.  2. Right Breast, upper-outer quadrant, axillary tail  - invasive ductal carcinoma, grade 2  - ductal carcinoma in situ, grade 2-3  - Prognostic indicators significant for: estrogen receptor, 100% positive and progesterone receptor, 50% positive, both with strong staining intensity. HER2 equivocal by immunohistochemistry (2+), but negative by fluorescent in situ hybridization with a signals ratio 1.9 and number per cell 5.3.  The patient's subsequent history is as detailed below.   PAST MEDICAL HISTORY: Past Medical History:  Diagnosis Date  . Anemia   . right breast ca dx'd 06/2019  . Varicose vein   . Varicose veins of left lower extremity    "bluging ones at top- no pain"    PAST SURGICAL HISTORY: Past  Surgical History:  Procedure Laterality Date  . NO PAST SURGERIES    . none    . PORTACATH PLACEMENT Right 08/17/2019   Procedure: INSERTION PORT-A-CATH WITH ULTRASOUND;  Surgeon: [name],  MD;  Location: [facility];  Service: General;  Laterality: Right;  . wisdom teeth removal     remotely.     FAMILY HISTORY: Family History  Problem Relation Age of Onset  . Diabetes Father   . Blindness Mother   . Congestive Heart Failure Sister   . Gallbladder disease Sister        removed  . Diabetes Sister   . Colitis Sister    The patient's father died at age 64 from a stroke.  The patient's mother died from multiple medical problems not related to cancer at age 21.  The patient had 4 brothers, 7 sisters.  There is no breast ovarian or prostate cancer in the family to her knowledge.  She had one cousin with pancreatic cancer.   GYNECOLOGIC HISTORY:  No LMP recorded. Patient is postmenopausal. Menarche: 64 years old Age at first live birth: 64 years old [place] P 5 LMP 58 Contraceptive: Brief use of oral contraceptives as a teenager HRT no Hysterectomy?  No BSO?    SOCIAL HISTORY: (updated 07/2019)  Paige Gilbert works in a Engineer, agricultural, and Education administrator houses.  Her husband of 22 years [name] is a Chief Operating Officer.  Their children are [name], 49, who lives in [place] and is a Veterinary surgeon in the [employer]; [name] 33 who works as a Designer, jewellery at [employer]; [name] who lives in [place], and is not currently employed; [place] lives in [place], and [name] who is an Armed forces training and education officer in [place].  The patient has 4 grandchildren all under the age of 90    ADVANCED DIRECTIVES: Husband [name] is her HCPOA.   HEALTH MAINTENANCE: Social History   Tobacco Use  . Smoking status: Never Smoker  . Smokeless tobacco: Never Used  Substance Use Topics  . Alcohol use: Yes    Comment: rarely  . Drug use: No     Colonoscopy: never done  PAP: Remote  Bone density: Never   No Known Allergies  Current Outpatient Medications  Medication Sig Dispense Refill  . Calcium Citrate-Vitamin D (CALCIUM CITRATE + D PO) Take 1 tablet by mouth 2 (two) times daily.      Marland Kitchen ibuprofen (ADVIL) 800 MG tablet Take 1 tablet (800 mg total) by mouth every 8 (eight) hours as needed. 30 tablet 0  . lidocaine-prilocaine (EMLA) cream Apply to affected area once 30 g 3  . LORazepam (ATIVAN) 0.5 MG tablet Take 1 tablet (0.5 mg total) by mouth at bedtime as needed (Nausea or vomiting). 30 tablet 0  . oxyCODONE (OXY IR/ROXICODONE) 5 MG immediate release tablet     . prochlorperazine (COMPAZINE) 10 MG tablet Take 1 tablet (10 mg total) by mouth every 6 (six) hours as needed (Nausea or vomiting). 30 tablet 1   No current facility-administered medications for this visit.    OBJECTIVE: White woman in no acute distress Vitals:   12/21/19 1158  BP: 102/64  Pulse: 70  Resp: 18  Temp: (!) 97.1 F (36.2 C)  SpO2: 100%     Body mass index is 22.45 kg/m.   Wt Readings from Last 3 Encounters:  12/21/19 137 lb (62.1 kg)  12/14/19 137 lb 9.6 oz (62.4 kg)  11/30/19 134 lb 11.2 oz (61.1 kg)  ECOG FS:1 - Symptomatic  but completely ambulatory  Sclerae unicteric, EOMs intact Wearing a mask No cervical or supraclavicular adenopathy Lungs no rales or rhonchi, mild nonproductive cough provoked by deep inspiration Heart regular rate and rhythm Abd soft, nontender, positive bowel sounds MSK no focal spinal tenderness, no upper extremity lymphedema Neuro: nonfocal, well oriented, appropriate affect Breasts: The right breast is unchanged, still showing a mass which appears relatively fixed, with involvement of the skin, but no erythema.  It is not tender.  Left breast is benign.  Both axillae are benign.   LAB RESULTS:  CMP     Component Value Date/Time   NA 140 12/14/2019 1142   K 4.0 12/14/2019 1142   CL 108 12/14/2019 1142   CO2 23 12/14/2019 1142   GLUCOSE 105 (H) 12/14/2019 1142   BUN 13 12/14/2019 1142   CREATININE 0.68 12/14/2019 1142   CREATININE 0.79 08/04/2019 1546   CREATININE 0.76 03/27/2015 1146   CALCIUM 8.6 (L) 12/14/2019 1142   PROT 6.0 (L) 12/14/2019  1142   ALBUMIN 3.7 12/14/2019 1142   AST 23 12/14/2019 1142   AST 17 08/04/2019 1546   ALT 36 12/14/2019 1142   ALT 21 08/04/2019 1546   ALKPHOS 68 12/14/2019 1142   BILITOT 0.3 12/14/2019 1142   BILITOT 0.2 (L) 08/04/2019 1546   GFRNONAA >60 12/14/2019 1142   GFRNONAA >60 08/04/2019 1546   GFRNONAA 87 03/27/2015 1146   GFRAA >60 12/14/2019 1142   GFRAA >60 08/04/2019 1546   GFRAA >89 03/27/2015 1146    No results found for: TOTALPROTELP, ALBUMINELP, A1GS, A2GS, BETS, BETA2SER, GAMS, MSPIKE, SPEI  Lab Results  Component Value Date   WBC 3.0 (L) 12/21/2019   NEUTROABS 1.7 12/21/2019   HGB 11.4 (L) 12/21/2019   HCT 34.7 (L) 12/21/2019   MCV 88.7 12/21/2019   PLT 189 12/21/2019    No results found for: LABCA2  No components found for: PNTIRW431  No results for input(s): INR in the last 168 hours.  No results found for: LABCA2  No results found for: VQM086  No results found for: PYP950  No results found for: DTO671  Lab Results  Component Value Date   CA2729 27.4 08/18/2019    No components found for: HGQUANT  No results found for: CEA1 / No results found for: CEA1   No results found for: AFPTUMOR  No results found for: CHROMOGRNA  No results found for: KPAFRELGTCHN, LAMBDASER, KAPLAMBRATIO (kappa/lambda light chains)  No results found for: HGBA, HGBA2QUANT, HGBFQUANT, HGBSQUAN (Hemoglobinopathy evaluation)   No results found for: LDH  Lab Results  Component Value Date   IRON 94 02/15/2015   TIBC 272 02/15/2015   IRONPCTSAT 35 02/15/2015   (Iron and TIBC)  Lab Results  Component Value Date   FERRITIN 70 02/15/2015    Urinalysis No results found for: COLORURINE, APPEARANCEUR, LABSPEC, PHURINE, GLUCOSEU, HGBUR, BILIRUBINUR, KETONESUR, PROTEINUR, UROBILINOGEN, NITRITE, LEUKOCYTESUR   STUDIES: No results found.  ELIGIBLE FOR AVAILABLE RESEARCH PROTOCOL:   ASSESSMENT: 64 y.o. [place], [place] woman status post right breast biopsy x2 on  07/13/2019 for a clinical T3-T4 N1-2, stage III invasive ductal carcinoma, grade 2, estrogen receptor positive, progesterone receptor variable, HER-2 not amplified.  (a) breast MRI 08/15/2019 showed a 7.6 area of enhancement, and 5 suspicious right axillary lymph nodes as well as two 0.5 mm additional masses in the inferior right breast   (1) neoadjuvant chemotherapy consisting of cyclophosphamide and doxorubicin in dose dense fashion x4 starting 08/18/2019, completed 09/28/2019, followed by  weekly paclitaxel x12 starting 10/12/2019  (2) definitive surgery (right modified radical mastectomy) to follow  (3) adjuvant radiation as appropriate  (4) antiestrogens to start at the completion of local treatment   PLAN: Paige Gilbert will receive her ninth dose of paclitaxel today.  She does have symptoms of a mild upper respiratory infection which appears viral clinically.  I offered to give her a week off and make up the treatment at the end but she really does not want to do that.  She was to get it over with, and I think it is safe to do so given that she has had no fever and her counts are good  She already has an appointment to return in 1 week with visit and again we are checking weekly to make sure no peripheral neuropathy develops.  She is already scheduled for MRI of the breast 01/04/2020  Total encounter time 25 minutes.Sarajane Jews C. Samay Delcarlo, MD 12/21/19 12:10 PM Medical Oncology and Hematology [facility] [place], [place] [address] Tel. [phone]    Fax. [phone]   I, Wilburn Mylar, am acting as scribe for Dr. [name]. Paige Gilbert.  I, Lurline Del MD, have reviewed the above documentation for accuracy and completeness, and I agree with the above.    *Total Encounter Time as defined by the Centers for Medicare and Medicaid Services includes, in addition to the face-to-face time of a patient visit (documented in the note above) non-face-to-face  time: obtaining and reviewing outside history, ordering and reviewing medications, tests or procedures, care coordination (communications with other health care professionals or caregivers) and documentation in the medical record.

## 2019-12-21 ENCOUNTER — Other Ambulatory Visit: Payer: Self-pay

## 2019-12-21 ENCOUNTER — Inpatient Hospital Stay: Payer: Managed Care, Other (non HMO)

## 2019-12-21 ENCOUNTER — Ambulatory Visit: Payer: Managed Care, Other (non HMO) | Admitting: Adult Health

## 2019-12-21 ENCOUNTER — Other Ambulatory Visit: Payer: Managed Care, Other (non HMO)

## 2019-12-21 ENCOUNTER — Inpatient Hospital Stay: Payer: Managed Care, Other (non HMO) | Admitting: Oncology

## 2019-12-21 VITALS — BP 102/64 | HR 70 | Temp 97.1°F | Resp 18 | Ht 65.5 in | Wt 137.0 lb

## 2019-12-21 DIAGNOSIS — Z170 Estrogen receptor positive status [ER+]: Secondary | ICD-10-CM

## 2019-12-21 DIAGNOSIS — C50411 Malignant neoplasm of upper-outer quadrant of right female breast: Secondary | ICD-10-CM | POA: Diagnosis not present

## 2019-12-21 DIAGNOSIS — Z5111 Encounter for antineoplastic chemotherapy: Secondary | ICD-10-CM | POA: Diagnosis not present

## 2019-12-21 LAB — CBC WITH DIFFERENTIAL/PLATELET
Abs Immature Granulocytes: 0.01 K/uL (ref 0.00–0.07)
Basophils Absolute: 0.1 K/uL (ref 0.0–0.1)
Basophils Relative: 2 %
Eosinophils Absolute: 0.1 K/uL (ref 0.0–0.5)
Eosinophils Relative: 2 %
HCT: 34.7 % — ABNORMAL LOW (ref 36.0–46.0)
Hemoglobin: 11.4 g/dL — ABNORMAL LOW (ref 12.0–15.0)
Immature Granulocytes: 0 %
Lymphocytes Relative: 29 %
Lymphs Abs: 0.9 K/uL (ref 0.7–4.0)
MCH: 29.2 pg (ref 26.0–34.0)
MCHC: 32.9 g/dL (ref 30.0–36.0)
MCV: 88.7 fL (ref 80.0–100.0)
Monocytes Absolute: 0.3 K/uL (ref 0.1–1.0)
Monocytes Relative: 9 %
Neutro Abs: 1.7 K/uL (ref 1.7–7.7)
Neutrophils Relative %: 58 %
Platelets: 189 K/uL (ref 150–400)
RBC: 3.91 MIL/uL (ref 3.87–5.11)
RDW: 13.1 % (ref 11.5–15.5)
WBC: 3 K/uL — ABNORMAL LOW (ref 4.0–10.5)
nRBC: 0 % (ref 0.0–0.2)

## 2019-12-21 LAB — COMPREHENSIVE METABOLIC PANEL
ALT: 37 U/L (ref 0–44)
AST: 24 U/L (ref 15–41)
Albumin: 3.8 g/dL (ref 3.5–5.0)
Alkaline Phosphatase: 71 U/L (ref 38–126)
Anion gap: 7 (ref 5–15)
BUN: 14 mg/dL (ref 8–23)
CO2: 24 mmol/L (ref 22–32)
Calcium: 9.2 mg/dL (ref 8.9–10.3)
Chloride: 107 mmol/L (ref 98–111)
Creatinine, Ser: 0.7 mg/dL (ref 0.44–1.00)
GFR calc Af Amer: >60 mL/min (ref >60)
GFR calc non Af Amer: >60 mL/min (ref >60)
Glucose, Bld: 95 mg/dL (ref 70–99)
Potassium: 4.1 mmol/L (ref 3.5–5.1)
Sodium: 138 mmol/L (ref 135–145)
Total Bilirubin: 0.3 mg/dL (ref 0.3–1.2)
Total Protein: 6.4 g/dL — ABNORMAL LOW (ref 6.5–8.1)

## 2019-12-21 MED ORDER — DEXAMETHASONE SODIUM PHOSPHATE 10 MG/ML IJ SOLN
4.0000 mg | Freq: Once | INTRAMUSCULAR | Status: AC
  Administered 2019-12-21: 4 mg via INTRAVENOUS

## 2019-12-21 MED ORDER — FAMOTIDINE IN NACL 20-0.9 MG/50ML-% IV SOLN
INTRAVENOUS | Status: AC
  Filled 2019-12-21: qty 50

## 2019-12-21 MED ORDER — SODIUM CHLORIDE 0.9% FLUSH
10.0000 mL | INTRAVENOUS | Status: DC | PRN
  Filled 2019-12-21: qty 10

## 2019-12-21 MED ORDER — DIPHENHYDRAMINE HCL 50 MG/ML IJ SOLN
INTRAMUSCULAR | Status: AC
  Filled 2019-12-21: qty 1

## 2019-12-21 MED ORDER — FAMOTIDINE IN NACL 20-0.9 MG/50ML-% IV SOLN
20.0000 mg | Freq: Once | INTRAVENOUS | Status: AC
  Administered 2019-12-21: 20 mg via INTRAVENOUS

## 2019-12-21 MED ORDER — SODIUM CHLORIDE 0.9% FLUSH
10.0000 mL | INTRAVENOUS | Status: DC | PRN
  Administered 2019-12-21: 10 mL
  Filled 2019-12-21: qty 10

## 2019-12-21 MED ORDER — SODIUM CHLORIDE 0.9 % IV SOLN
80.0000 mg/m2 | Freq: Once | INTRAVENOUS | Status: AC
  Administered 2019-12-21: 138 mg via INTRAVENOUS
  Filled 2019-12-21: qty 23

## 2019-12-21 MED ORDER — DEXAMETHASONE SODIUM PHOSPHATE 10 MG/ML IJ SOLN
INTRAMUSCULAR | Status: AC
  Filled 2019-12-21: qty 1

## 2019-12-21 MED ORDER — HEPARIN SOD (PORK) LOCK FLUSH 100 UNIT/ML IV SOLN
500.0000 [IU] | Freq: Once | INTRAVENOUS | Status: AC | PRN
  Administered 2019-12-21: 500 [IU]
  Filled 2019-12-21: qty 5

## 2019-12-21 MED ORDER — SODIUM CHLORIDE 0.9 % IV SOLN
Freq: Once | INTRAVENOUS | Status: AC
  Filled 2019-12-21: qty 250

## 2019-12-21 MED ORDER — DIPHENHYDRAMINE HCL 50 MG/ML IJ SOLN
25.0000 mg | Freq: Once | INTRAMUSCULAR | Status: AC
  Administered 2019-12-21: 25 mg via INTRAVENOUS

## 2019-12-21 NOTE — Patient Instructions (Signed)
[facility] Discharge Instructions for Patients Receiving Chemotherapy  Today you received the following chemotherapy agents:  Taxol.  To help prevent nausea and vomiting after your treatment, we encourage you to take your nausea medication as directed.   If you develop nausea and vomiting that is not controlled by your nausea medication, call the clinic.   BELOW ARE SYMPTOMS THAT SHOULD BE REPORTED IMMEDIATELY:  *FEVER GREATER THAN 100.5 F  *CHILLS WITH OR WITHOUT FEVER  NAUSEA AND VOMITING THAT IS NOT CONTROLLED WITH YOUR NAUSEA MEDICATION  *UNUSUAL SHORTNESS OF BREATH  *UNUSUAL BRUISING OR BLEEDING  TENDERNESS IN MOUTH AND THROAT WITH OR WITHOUT PRESENCE OF ULCERS  *URINARY PROBLEMS  *BOWEL PROBLEMS  UNUSUAL RASH Items with * indicate a potential emergency and should be followed up as soon as possible.  Feel free to call the clinic should you have any questions or concerns. The clinic phone number is [phone].  Please show the CHEMO ALERT CARD at check-in to the Emergency Department and triage nurse.   

## 2019-12-22 ENCOUNTER — Telehealth: Payer: Self-pay | Admitting: Oncology

## 2019-12-22 NOTE — Telephone Encounter (Signed)
No 5/25 los. No changes made to pt's schedule.

## 2019-12-27 NOTE — Progress Notes (Signed)
[place]  [phone] Fax:(336) [phone]     ID: Paige Gilbert DOB: 1956-07-22  MR#: [id number]  YQM#:[id number]  Patient Care Team: [name], NP as PCP - General (Family Medicine) [name], RN as Oncology Nurse Navigator [name], RN as Oncology Nurse Navigator Yocelin Vanlue, [name], MD as Consulting Physician (Oncology) [name], MD as Attending Physician (Radiation Oncology) [name], MD as Consulting Physician (General Surgery) [name], MD OTHER MD:  CHIEF COMPLAINT: Locally advanced estrogen receptor positive breast cancer  CURRENT TREATMENT: Neoadjuvant chemotherapy   INTERVAL HISTORY: Paige Gilbert returns today for follow up and treatment of her locally advanced breast cancer.  She continues on weekly paclitaxel. Today is week 10 of 12 planned treatments.  Importantly, she has no peripheral neuropathy.  She feels minimal fatigue but still is cleaning at least 3 homes and is very interested in continuing to work right through the end of treatment.  She is scheduled for breast MRI on 01/04/2020 .  Accordingly her treatment next week will be moved to 01/05/2020.  The remaining treatment will be back to Wednesday  REVIEW OF SYSTEMS: Paige Gilbert had many questions today regarding radiation and surgery.  The plan is for mastectomy without reconstruction and for 6-1/2 weeks of radiation following.  She is planning a trip to [place] which she hopes to take at the end of chemo and before surgery which means sometime in the week before the July 4 holiday.  Likely she will have surgery shortly after that holiday.  A detailed review of systems today was otherwise stable.   HISTORY OF CURRENT ILLNESS: From the original intake note:  Paige Gilbert has noted a difference between her right and left breast for several years.  She last had a mammogram she thinks about 7 or 8 years ago.  More recently it seemed to her  that her right breast was becoming more firm.  She brought it to medical attention and underwent bilateral diagnostic mammography with tomography and right breast ultrasonography at [facility] on 07/07/2019 showing: heterogeneously dense breast tissue; right nipple retraction; architectural distortion with microcalcifications and irregular dense tissue in the upper-outer quadrant; at least 3 abnormal right axillary lymph nodes.  Accordingly on 07/13/2019 she proceeded to biopsy of the right breast area in question. The pathology from this procedure ([id number]) showed:   1. Right Breast, upper-outer quadrant, closer to the nipple  - invasive ductal carcinoma with micropapillary features, grade 2  - ductal carcinoma in situ, grade 2-3, solid and cribriform pattern with comedonecrosis  - Prognostic indicators significant for: estrogen receptor, 100% positive with strong staining intensity and progesterone receptor, 0% negative. HER2 equivocal by immunohistochemistry (2+), but negative by fluorescent in situ hybridization with a signals ratio 1.5 and number per cell 3.8.  2. Right Breast, upper-outer quadrant, axillary tail  - invasive ductal carcinoma, grade 2  - ductal carcinoma in situ, grade 2-3  - Prognostic indicators significant for: estrogen receptor, 100% positive and progesterone receptor, 50% positive, both with strong staining intensity. HER2 equivocal by immunohistochemistry (2+), but negative by fluorescent in situ hybridization with a signals ratio 1.9 and number per cell 5.3.  The patient's subsequent history is as detailed below.   PAST MEDICAL HISTORY: Past Medical History:  Diagnosis Date  . Anemia   . right breast ca dx'd 06/2019  . Varicose vein   . Varicose veins of left lower extremity    "bluging ones at top- no pain"  PAST SURGICAL HISTORY: Past Surgical History:  Procedure Laterality Date  . NO PAST SURGERIES    . none    . PORTACATH PLACEMENT Right  08/17/2019   Procedure: INSERTION PORT-A-CATH WITH ULTRASOUND;  Surgeon: [name], MD;  Location: [facility];  Service: General;  Laterality: Right;  . wisdom teeth removal     remotely.     FAMILY HISTORY: Family History  Problem Relation Age of Onset  . Diabetes Father   . Blindness Mother   . Congestive Heart Failure Sister   . Gallbladder disease Sister        removed  . Diabetes Sister   . Colitis Sister    The patient's father died at age 39 from a stroke.  The patient's mother died from multiple medical problems not related to cancer at age 75.  The patient had 4 brothers, 7 sisters.  There is no breast ovarian or prostate cancer in the family to her knowledge.  She had one cousin with pancreatic cancer.   GYNECOLOGIC HISTORY:  No LMP recorded. Patient is postmenopausal. Menarche: 64 years old Age at first live birth: 64 years old [place] P 5 LMP 2 Contraceptive: Brief use of oral contraceptives as a teenager HRT no Hysterectomy?  No BSO?    SOCIAL HISTORY: (updated 07/2019)  [place] works in a Engineer, agricultural, and Education administrator houses.  Her husband of 72 years [name] is a Chief Operating Officer.  Their children are [name], 47, who lives in [place] and is a Veterinary surgeon in the [employer]; [name] 33 who works as a Designer, jewellery at [employer]; [name] who lives in [place], and is not currently employed; [employer] lives in [place], and Paige Gilbert who is an Armed forces training and education officer in [place].  The patient has 4 grandchildren all under the age of 60    ADVANCED DIRECTIVES: Husband [name] is her HCPOA.   HEALTH MAINTENANCE: Social History   Tobacco Use  . Smoking status: Never Smoker  . Smokeless tobacco: Never Used  Substance Use Topics  . Alcohol use: Yes    Comment: rarely  . Drug use: No     Colonoscopy: never done  PAP: Remote  Bone density: Never   No Known Allergies  Current Outpatient Medications  Medication Sig Dispense Refill  . Calcium  Citrate-Vitamin D (CALCIUM CITRATE + D PO) Take 1 tablet by mouth 2 (two) times daily.     Marland Kitchen ibuprofen (ADVIL) 800 MG tablet Take 1 tablet (800 mg total) by mouth every 8 (eight) hours as needed. 30 tablet 0  . lidocaine-prilocaine (EMLA) cream Apply to affected area once 30 g 3  . LORazepam (ATIVAN) 0.5 MG tablet Take 1 tablet (0.5 mg total) by mouth at bedtime as needed (Nausea or vomiting). 30 tablet 0  . oxyCODONE (OXY IR/ROXICODONE) 5 MG immediate release tablet     . prochlorperazine (COMPAZINE) 10 MG tablet Take 1 tablet (10 mg total) by mouth every 6 (six) hours as needed (Nausea or vomiting). 30 tablet 1   No current facility-administered medications for this visit.    OBJECTIVE: White woman who appears younger than stated age 36:   12/28/19 1321  BP: (!) 119/55  Pulse: 78  Resp: 18  Temp: 98.7 F (37.1 C)  SpO2: 100%     Body mass index is 22.32 kg/m.   Wt Readings from Last 3 Encounters:  12/28/19 136 lb 3.2 oz (61.8 kg)  12/21/19 137 lb (62.1 kg)  12/14/19 137 lb 9.6 oz (62.4  kg)  ECOG FS:1 - Symptomatic but completely ambulatory  Sclerae unicteric, EOMs intact Wearing a mask No cervical or supraclavicular adenopathy Lungs no rales or rhonchi Heart regular rate and rhythm Abd soft, nontender, positive bowel sounds MSK no focal spinal tenderness, no upper extremity lymphedema Neuro: nonfocal, well oriented, appropriate affect Breasts: Deferred  LAB RESULTS:  CMP     Component Value Date/Time   NA 138 12/21/2019 1135   K 4.1 12/21/2019 1135   CL 107 12/21/2019 1135   CO2 24 12/21/2019 1135   GLUCOSE 95 12/21/2019 1135   BUN 14 12/21/2019 1135   CREATININE 0.70 12/21/2019 1135   CREATININE 0.79 08/04/2019 1546   CREATININE 0.76 03/27/2015 1146   CALCIUM 9.2 12/21/2019 1135   PROT 6.4 (L) 12/21/2019 1135   ALBUMIN 3.8 12/21/2019 1135   AST 24 12/21/2019 1135   AST 17 08/04/2019 1546   ALT 37 12/21/2019 1135   ALT 21 08/04/2019 1546   ALKPHOS 71  12/21/2019 1135   BILITOT 0.3 12/21/2019 1135   BILITOT 0.2 (L) 08/04/2019 1546   GFRNONAA >60 12/21/2019 1135   GFRNONAA >60 08/04/2019 1546   GFRNONAA 87 03/27/2015 1146   GFRAA >60 12/21/2019 1135   GFRAA >60 08/04/2019 1546   GFRAA >89 03/27/2015 1146    No results found for: TOTALPROTELP, ALBUMINELP, A1GS, A2GS, BETS, BETA2SER, GAMS, MSPIKE, SPEI  Lab Results  Component Value Date   WBC 2.9 (L) 12/28/2019   NEUTROABS PENDING 12/28/2019   HGB 11.3 (L) 12/28/2019   HCT 33.9 (L) 12/28/2019   MCV 88.3 12/28/2019   PLT 184 12/28/2019    No results found for: LABCA2  No components found for: BZJIRC789  No results for input(s): INR in the last 168 hours.  No results found for: LABCA2  No results found for: FYB017  No results found for: PZW258  No results found for: NID782  Lab Results  Component Value Date   CA2729 27.4 08/18/2019    No components found for: HGQUANT  No results found for: CEA1 / No results found for: CEA1   No results found for: AFPTUMOR  No results found for: CHROMOGRNA  No results found for: KPAFRELGTCHN, LAMBDASER, KAPLAMBRATIO (kappa/lambda light chains)  No results found for: HGBA, HGBA2QUANT, HGBFQUANT, HGBSQUAN (Hemoglobinopathy evaluation)   No results found for: LDH  Lab Results  Component Value Date   IRON 94 02/15/2015   TIBC 272 02/15/2015   IRONPCTSAT 35 02/15/2015   (Iron and TIBC)  Lab Results  Component Value Date   FERRITIN 70 02/15/2015    Urinalysis No results found for: COLORURINE, APPEARANCEUR, LABSPEC, PHURINE, GLUCOSEU, HGBUR, BILIRUBINUR, KETONESUR, PROTEINUR, UROBILINOGEN, NITRITE, LEUKOCYTESUR   STUDIES: No results found.  ELIGIBLE FOR AVAILABLE RESEARCH PROTOCOL:   ASSESSMENT: 64 y.o. [place], [place] woman status post right breast biopsy x2 on 07/13/2019 for a clinical T3-T4 N1-2, stage III invasive ductal carcinoma, grade 2, estrogen receptor positive, progesterone receptor variable, HER-2 not  amplified.  (a) breast MRI 08/15/2019 showed a 7.6 area of enhancement, and 5 suspicious right axillary lymph nodes as well as two 0.5 mm additional masses in the inferior right breast   (1) neoadjuvant chemotherapy consisting of cyclophosphamide and doxorubicin in dose dense fashion x4 starting 08/18/2019, completed 09/28/2019, followed by weekly paclitaxel x12 starting 10/12/2019  (2) definitive surgery (right modified radical mastectomy) to follow  (3) adjuvant radiation as appropriate  (4) antiestrogens to start at the completion of local treatment   PLAN: Kathrine will proceed to  her tenth chemotherapy treatment today.  She continues to have no peripheral neuropathy and to Taxol well except for mild fatigue, which is not keeping her from working.  She would like to take a trip to [place] for about a week at the end of chemo.  That will be before the July 4 holiday.  Likely her surgery will be after that.  She wanted to know how long she would be out of work after surgery.  I thought that a month would be reasonable but if she is lucky she might be able to get back to work after 2 weeks.  Of course she will discuss that further with Dr. Brantley Stage when she meets with him.  Next week she is scheduled to see my nurse practitioner.  I will stop them just for a moment to review her MRI results with her  She knows to call for any other issue that may develop before the next visit  Total encounter time 25 minutes.Sarajane Jews C. Taniya Dasher, MD 12/28/19 1:57 PM Medical Oncology and Hematology [facility] [place], [place] [address] Tel. [phone]    Fax. [phone]   I, Wilburn Mylar, am acting as scribe for Dr. [name]. Amalie Koran.  I, Lurline Del MD, have reviewed the above documentation for accuracy and completeness, and I agree with the above.   *Total Encounter Time as defined by the Centers for Medicare and Medicaid Services includes, in  addition to the face-to-face time of a patient visit (documented in the note above) non-face-to-face time: obtaining and reviewing outside history, ordering and reviewing medications, tests or procedures, care coordination (communications with other health care professionals or caregivers) and documentation in the medical record.

## 2019-12-28 ENCOUNTER — Inpatient Hospital Stay: Payer: Managed Care, Other (non HMO) | Attending: Oncology

## 2019-12-28 ENCOUNTER — Other Ambulatory Visit: Payer: Self-pay

## 2019-12-28 ENCOUNTER — Inpatient Hospital Stay: Payer: Managed Care, Other (non HMO)

## 2019-12-28 ENCOUNTER — Encounter: Payer: Self-pay | Admitting: *Deleted

## 2019-12-28 ENCOUNTER — Inpatient Hospital Stay: Payer: Managed Care, Other (non HMO) | Admitting: Oncology

## 2019-12-28 VITALS — BP 119/55 | HR 78 | Temp 98.7°F | Resp 18 | Ht 65.5 in | Wt 136.2 lb

## 2019-12-28 DIAGNOSIS — C50411 Malignant neoplasm of upper-outer quadrant of right female breast: Secondary | ICD-10-CM

## 2019-12-28 DIAGNOSIS — Z170 Estrogen receptor positive status [ER+]: Secondary | ICD-10-CM

## 2019-12-28 DIAGNOSIS — Z5111 Encounter for antineoplastic chemotherapy: Secondary | ICD-10-CM | POA: Diagnosis present

## 2019-12-28 LAB — CBC WITH DIFFERENTIAL/PLATELET
Abs Immature Granulocytes: 0.01 10*3/uL (ref 0.00–0.07)
Basophils Absolute: 0.1 10*3/uL (ref 0.0–0.1)
Basophils Relative: 2 %
Eosinophils Absolute: 0.1 10*3/uL (ref 0.0–0.5)
Eosinophils Relative: 2 %
HCT: 33.9 % — ABNORMAL LOW (ref 36.0–46.0)
Hemoglobin: 11.3 g/dL — ABNORMAL LOW (ref 12.0–15.0)
Immature Granulocytes: 0 %
Lymphocytes Relative: 43 %
Lymphs Abs: 1.2 10*3/uL (ref 0.7–4.0)
MCH: 29.4 pg (ref 26.0–34.0)
MCHC: 33.3 g/dL (ref 30.0–36.0)
MCV: 88.3 fL (ref 80.0–100.0)
Monocytes Absolute: 0.3 10*3/uL (ref 0.1–1.0)
Monocytes Relative: 10 %
Neutro Abs: 1.3 10*3/uL — ABNORMAL LOW (ref 1.7–7.7)
Neutrophils Relative %: 43 %
Platelets: 184 10*3/uL (ref 150–400)
RBC: 3.84 MIL/uL — ABNORMAL LOW (ref 3.87–5.11)
RDW: 13.5 % (ref 11.5–15.5)
WBC: 2.9 10*3/uL — ABNORMAL LOW (ref 4.0–10.5)
nRBC: 0 % (ref 0.0–0.2)

## 2019-12-28 LAB — COMPREHENSIVE METABOLIC PANEL
ALT: 34 U/L (ref 0–44)
AST: 23 U/L (ref 15–41)
Albumin: 3.7 g/dL (ref 3.5–5.0)
Alkaline Phosphatase: 63 U/L (ref 38–126)
Anion gap: 10 (ref 5–15)
BUN: 12 mg/dL (ref 8–23)
CO2: 22 mmol/L (ref 22–32)
Calcium: 9 mg/dL (ref 8.9–10.3)
Chloride: 108 mmol/L (ref 98–111)
Creatinine, Ser: 0.71 mg/dL (ref 0.44–1.00)
GFR calc Af Amer: >60 mL/min (ref >60)
GFR calc non Af Amer: >60 mL/min (ref >60)
Glucose, Bld: 94 mg/dL (ref 70–99)
Potassium: 4.1 mmol/L (ref 3.5–5.1)
Sodium: 140 mmol/L (ref 135–145)
Total Bilirubin: 0.5 mg/dL (ref 0.3–1.2)
Total Protein: 6.2 g/dL — ABNORMAL LOW (ref 6.5–8.1)

## 2019-12-28 MED ORDER — SODIUM CHLORIDE 0.9% FLUSH
10.0000 mL | INTRAVENOUS | Status: DC | PRN
  Administered 2019-12-28: 10 mL
  Filled 2019-12-28: qty 10

## 2019-12-28 MED ORDER — DIPHENHYDRAMINE HCL 50 MG/ML IJ SOLN
25.0000 mg | Freq: Once | INTRAMUSCULAR | Status: AC
  Administered 2019-12-28: 25 mg via INTRAVENOUS

## 2019-12-28 MED ORDER — FAMOTIDINE IN NACL 20-0.9 MG/50ML-% IV SOLN
20.0000 mg | Freq: Once | INTRAVENOUS | Status: AC
  Administered 2019-12-28: 20 mg via INTRAVENOUS

## 2019-12-28 MED ORDER — DEXAMETHASONE SODIUM PHOSPHATE 10 MG/ML IJ SOLN
INTRAMUSCULAR | Status: AC
  Filled 2019-12-28: qty 1

## 2019-12-28 MED ORDER — SODIUM CHLORIDE 0.9 % IV SOLN
Freq: Once | INTRAVENOUS | Status: AC
  Filled 2019-12-28: qty 250

## 2019-12-28 MED ORDER — FAMOTIDINE IN NACL 20-0.9 MG/50ML-% IV SOLN
INTRAVENOUS | Status: AC
  Filled 2019-12-28: qty 50

## 2019-12-28 MED ORDER — DIPHENHYDRAMINE HCL 50 MG/ML IJ SOLN
INTRAMUSCULAR | Status: AC
  Filled 2019-12-28: qty 1

## 2019-12-28 MED ORDER — DEXAMETHASONE SODIUM PHOSPHATE 10 MG/ML IJ SOLN
4.0000 mg | Freq: Once | INTRAMUSCULAR | Status: AC
  Administered 2019-12-28: 4 mg via INTRAVENOUS

## 2019-12-28 MED ORDER — SODIUM CHLORIDE 0.9 % IV SOLN
80.0000 mg/m2 | Freq: Once | INTRAVENOUS | Status: AC
  Administered 2019-12-28: 138 mg via INTRAVENOUS
  Filled 2019-12-28: qty 23

## 2019-12-28 MED ORDER — HEPARIN SOD (PORK) LOCK FLUSH 100 UNIT/ML IV SOLN
500.0000 [IU] | Freq: Once | INTRAVENOUS | Status: AC | PRN
  Administered 2019-12-28: 500 [IU]
  Filled 2019-12-28: qty 5

## 2019-12-28 NOTE — Progress Notes (Signed)
Per Dr. Magrinat, ok to treat with ANC 1.3 

## 2019-12-28 NOTE — Patient Instructions (Signed)
[facility] Discharge Instructions for Patients Receiving Chemotherapy  Today you received the following chemotherapy agents:  Taxol.  To help prevent nausea and vomiting after your treatment, we encourage you to take your nausea medication as directed.   If you develop nausea and vomiting that is not controlled by your nausea medication, call the clinic.   BELOW ARE SYMPTOMS THAT SHOULD BE REPORTED IMMEDIATELY:  *FEVER GREATER THAN 100.5 F  *CHILLS WITH OR WITHOUT FEVER  NAUSEA AND VOMITING THAT IS NOT CONTROLLED WITH YOUR NAUSEA MEDICATION  *UNUSUAL SHORTNESS OF BREATH  *UNUSUAL BRUISING OR BLEEDING  TENDERNESS IN MOUTH AND THROAT WITH OR WITHOUT PRESENCE OF ULCERS  *URINARY PROBLEMS  *BOWEL PROBLEMS  UNUSUAL RASH Items with * indicate a potential emergency and should be followed up as soon as possible.  Feel free to call the clinic should you have any questions or concerns. The clinic phone number is [phone].  Please show the CHEMO ALERT CARD at check-in to the Emergency Department and triage nurse.   

## 2019-12-29 ENCOUNTER — Telehealth: Payer: Self-pay | Admitting: Oncology

## 2019-12-29 NOTE — Telephone Encounter (Signed)
No 6/1 los. No changes made to pt's schedule.

## 2020-01-04 ENCOUNTER — Other Ambulatory Visit: Payer: Self-pay

## 2020-01-04 ENCOUNTER — Encounter: Payer: Self-pay | Admitting: *Deleted

## 2020-01-04 ENCOUNTER — Ambulatory Visit
Admission: RE | Admit: 2020-01-04 | Discharge: 2020-01-04 | Disposition: A | Discharge status: Home / Self Care | Payer: Managed Care, Other (non HMO) | Source: Ambulatory Visit | Attending: Adult Health | Admitting: Adult Health

## 2020-01-04 DIAGNOSIS — Z170 Estrogen receptor positive status [ER+]: Secondary | ICD-10-CM

## 2020-01-04 MED ORDER — GADOBUTROL 1 MMOL/ML IV SOLN
6.0000 mL | Freq: Once | INTRAVENOUS | Status: AC | PRN
  Administered 2020-01-04: 6 mL via INTRAVENOUS

## 2020-01-05 ENCOUNTER — Inpatient Hospital Stay: Payer: Managed Care, Other (non HMO)

## 2020-01-05 ENCOUNTER — Other Ambulatory Visit: Payer: Self-pay

## 2020-01-05 ENCOUNTER — Inpatient Hospital Stay: Payer: Managed Care, Other (non HMO) | Admitting: Adult Health

## 2020-01-05 ENCOUNTER — Telehealth: Payer: Self-pay | Admitting: Oncology

## 2020-01-05 VITALS — BP 98/63 | HR 68 | Temp 98.7°F | Resp 18 | Ht 65.5 in | Wt 138.1 lb

## 2020-01-05 DIAGNOSIS — C50411 Malignant neoplasm of upper-outer quadrant of right female breast: Secondary | ICD-10-CM

## 2020-01-05 DIAGNOSIS — Z5111 Encounter for antineoplastic chemotherapy: Secondary | ICD-10-CM | POA: Diagnosis not present

## 2020-01-05 DIAGNOSIS — Z170 Estrogen receptor positive status [ER+]: Secondary | ICD-10-CM

## 2020-01-05 LAB — CBC WITH DIFFERENTIAL/PLATELET
Abs Immature Granulocytes: 0.02 10*3/uL (ref 0.00–0.07)
Basophils Absolute: 0.1 10*3/uL (ref 0.0–0.1)
Basophils Relative: 2 %
Eosinophils Absolute: 0.1 10*3/uL (ref 0.0–0.5)
Eosinophils Relative: 2 %
HCT: 34.3 % — ABNORMAL LOW (ref 36.0–46.0)
Hemoglobin: 11.7 g/dL — ABNORMAL LOW (ref 12.0–15.0)
Immature Granulocytes: 1 %
Lymphocytes Relative: 30 %
Lymphs Abs: 1.1 10*3/uL (ref 0.7–4.0)
MCH: 29.8 pg (ref 26.0–34.0)
MCHC: 34.1 g/dL (ref 30.0–36.0)
MCV: 87.3 fL (ref 80.0–100.0)
Monocytes Absolute: 0.3 10*3/uL (ref 0.1–1.0)
Monocytes Relative: 7 %
Neutro Abs: 2.2 10*3/uL (ref 1.7–7.7)
Neutrophils Relative %: 58 %
Platelets: 214 10*3/uL (ref 150–400)
RBC: 3.93 MIL/uL (ref 3.87–5.11)
RDW: 13.7 % (ref 11.5–15.5)
WBC: 3.8 10*3/uL — ABNORMAL LOW (ref 4.0–10.5)
nRBC: 0 % (ref 0.0–0.2)

## 2020-01-05 LAB — COMPREHENSIVE METABOLIC PANEL
ALT: 43 U/L (ref 0–44)
AST: 28 U/L (ref 15–41)
Albumin: 3.6 g/dL (ref 3.5–5.0)
Alkaline Phosphatase: 60 U/L (ref 38–126)
Anion gap: 9 (ref 5–15)
BUN: 12 mg/dL (ref 8–23)
CO2: 23 mmol/L (ref 22–32)
Calcium: 9 mg/dL (ref 8.9–10.3)
Chloride: 110 mmol/L (ref 98–111)
Creatinine, Ser: 0.7 mg/dL (ref 0.44–1.00)
GFR calc Af Amer: >60 mL/min (ref >60)
GFR calc non Af Amer: >60 mL/min (ref >60)
Glucose, Bld: 90 mg/dL (ref 70–99)
Potassium: 4.3 mmol/L (ref 3.5–5.1)
Sodium: 142 mmol/L (ref 135–145)
Total Bilirubin: 0.3 mg/dL (ref 0.3–1.2)
Total Protein: 6.1 g/dL — ABNORMAL LOW (ref 6.5–8.1)

## 2020-01-05 MED ORDER — FAMOTIDINE IN NACL 20-0.9 MG/50ML-% IV SOLN
20.0000 mg | Freq: Once | INTRAVENOUS | Status: AC
  Administered 2020-01-05: 20 mg via INTRAVENOUS

## 2020-01-05 MED ORDER — DIPHENHYDRAMINE HCL 50 MG/ML IJ SOLN
25.0000 mg | Freq: Once | INTRAMUSCULAR | Status: AC
  Administered 2020-01-05: 25 mg via INTRAVENOUS

## 2020-01-05 MED ORDER — HEPARIN SOD (PORK) LOCK FLUSH 100 UNIT/ML IV SOLN
500.0000 [IU] | Freq: Once | INTRAVENOUS | Status: AC | PRN
  Administered 2020-01-05: 500 [IU]
  Filled 2020-01-05: qty 5

## 2020-01-05 MED ORDER — DEXAMETHASONE SODIUM PHOSPHATE 10 MG/ML IJ SOLN
INTRAMUSCULAR | Status: AC
  Filled 2020-01-05: qty 1

## 2020-01-05 MED ORDER — SODIUM CHLORIDE 0.9% FLUSH
10.0000 mL | INTRAVENOUS | Status: DC | PRN
  Administered 2020-01-05: 10 mL
  Filled 2020-01-05: qty 10

## 2020-01-05 MED ORDER — DEXAMETHASONE SODIUM PHOSPHATE 10 MG/ML IJ SOLN
4.0000 mg | Freq: Once | INTRAMUSCULAR | Status: AC
  Administered 2020-01-05: 4 mg via INTRAVENOUS

## 2020-01-05 MED ORDER — FAMOTIDINE IN NACL 20-0.9 MG/50ML-% IV SOLN
INTRAVENOUS | Status: AC
  Filled 2020-01-05: qty 50

## 2020-01-05 MED ORDER — SODIUM CHLORIDE 0.9 % IV SOLN
Freq: Once | INTRAVENOUS | Status: AC
  Filled 2020-01-05: qty 250

## 2020-01-05 MED ORDER — SODIUM CHLORIDE 0.9 % IV SOLN
80.0000 mg/m2 | Freq: Once | INTRAVENOUS | Status: AC
  Administered 2020-01-05: 138 mg via INTRAVENOUS
  Filled 2020-01-05: qty 23

## 2020-01-05 MED ORDER — DIPHENHYDRAMINE HCL 50 MG/ML IJ SOLN
INTRAMUSCULAR | Status: AC
  Filled 2020-01-05: qty 1

## 2020-01-05 NOTE — Telephone Encounter (Signed)
Release: [id number] Faxed medical records to [facility] healthcare @ [phone]

## 2020-01-05 NOTE — Progress Notes (Addendum)
[place]  Telephone:([phone]) [phone] Fax:(336) [phone]     ID: Paige Gilbert DOB: [birth year]  MR#: [id number]  VQX#:[id number]  Patient Care Team: Paige Booze, NP as PCP - General (Family Medicine) Paige Kaufmann, RN as Oncology Nurse Navigator Paige Germany, RN as Oncology Nurse Navigator Magrinat, Paige Dad, MD as Consulting Physician (Oncology) Paige Gibson, MD as Attending Physician (Radiation Oncology) Paige Luna, MD as Consulting Physician (General Surgery) Paige Cruel, MD OTHER MD:  CHIEF COMPLAINT: Locally advanced estrogen receptor positive breast cancer  CURRENT TREATMENT: Neoadjuvant chemotherapy   INTERVAL HISTORY: Paige Gilbert returns today for follow up and treatment of her locally advanced breast cancer.  She continues on weekly paclitaxel. Today is week 11 of 12 planned treatments.  Importantly, she has no peripheral neuropathy.    Since her last visit, she underwent a bilateral breast MRI that showed a slight interval decrease in right upper outer quadrant malignancy, now 6.5 x 3.2 x 6 cm, previously 7.6 x 3.9 x 5.9 cm.  Resolution of the right axillary notes were also noted.    REVIEW OF SYSTEMS: Paige Gilbert had a URI last week.  She has had a lingering cough.  No shortness of breath.  Taking Claritin has helped.  She notes that she did not receive antibiotics for this, and is improving.  She says the residual cough has been somewhat of a bother.  She denies fever, chills, chest pain, palpitations, cough, shortness of breath, bowel/bladder changes, headaches, mucositis, or any other concerns.  A detailed ROS was otherwise non contributory.    HISTORY OF CURRENT ILLNESS: From the original intake note:  Paige Gilbert has noted a difference between her right and left breast for several years.  She last had a mammogram she thinks about 7 or 8 years ago.  More recently it seemed to her that her right breast was becoming more firm.   She brought it to medical attention and underwent bilateral diagnostic mammography with tomography and right breast ultrasonography at [facility] on 07/07/2019 showing: heterogeneously dense breast tissue; right nipple retraction; architectural distortion with microcalcifications and irregular dense tissue in the upper-outer quadrant; at least 3 abnormal right axillary lymph nodes.  Accordingly on 07/13/2019 she proceeded to biopsy of the right breast area in question. The pathology from this procedure ([id number]) showed:   1. Right Breast, upper-outer quadrant, closer to the nipple  - invasive ductal carcinoma with micropapillary features, grade 2  - ductal carcinoma in situ, grade 2-3, solid and cribriform pattern with comedonecrosis  - Prognostic indicators significant for: estrogen receptor, 100% positive with strong staining intensity and progesterone receptor, 0% negative. HER2 equivocal by immunohistochemistry (2+), but negative by fluorescent in situ hybridization with a signals ratio 1.5 and number per cell 3.8.  2. Right Breast, upper-outer quadrant, axillary tail  - invasive ductal carcinoma, grade 2  - ductal carcinoma in situ, grade 2-3  - Prognostic indicators significant for: estrogen receptor, 100% positive and progesterone receptor, 50% positive, both with strong staining intensity. HER2 equivocal by immunohistochemistry (2+), but negative by fluorescent in situ hybridization with a signals ratio 1.9 and number per cell 5.3.  The patient's subsequent history is as detailed below.   PAST MEDICAL HISTORY: Past Medical History:  Diagnosis Date   Anemia    right breast ca dx'd 06/2019   Varicose vein    Varicose veins of left lower extremity    "bluging ones at top- no pain"    PAST SURGICAL HISTORY:  Past Surgical History:  Procedure Laterality Date   NO PAST SURGERIES     none     PORTACATH PLACEMENT Right 08/17/2019   Procedure: INSERTION PORT-A-CATH WITH  ULTRASOUND;  Surgeon: Paige Luna, MD;  Location: [place];  Service: General;  Laterality: Right;   wisdom teeth removal     remotely.     FAMILY HISTORY: Family History  Problem Relation Age of Onset   Diabetes Father    Blindness Mother    Congestive Heart Failure Sister    Gallbladder disease Sister        removed   Diabetes Sister    Colitis Sister    The patient's father died at age 50 from a stroke.  The patient's mother died from multiple medical problems not related to cancer at age 27.  The patient had 4 brothers, 7 sisters.  There is no breast ovarian or prostate cancer in the family to her knowledge.  She had one cousin with pancreatic cancer.   GYNECOLOGIC HISTORY:  No LMP recorded. Patient is postmenopausal. Menarche: 64 years old Age at first live birth: 64 years old [place] P 5 LMP 79 Contraceptive: Brief use of oral contraceptives as a teenager HRT no Hysterectomy?  No BSO?    SOCIAL HISTORY: (updated 07/2019)  Paige Gilbert works in a Engineer, agricultural, and Education administrator houses.  Her husband of 40 years Paige Gilbert is a Chief Operating Officer.  Their children are Paige Gilbert, 65, who lives in [place] and is a Veterinary surgeon in the [employer]; Paige Gilbert 33 who works as a Designer, jewellery at [employer]; Paige Gilbert who lives in [place], and is not currently employed; Paige Gilbert lives in [place], and Paige Gilbert who is an Armed forces training and education officer in [place].  The patient has 4 grandchildren all under the age of 64    ADVANCED DIRECTIVES: Husband Paige Gilbert is her HCPOA.   HEALTH MAINTENANCE: Social History   Tobacco Use   Smoking status: Never Smoker   Smokeless tobacco: Never Used  Scientific laboratory technician Use: Never used  Substance Use Topics   Alcohol use: Yes    Comment: rarely   Drug use: No     Colonoscopy: never done  PAP: Remote  Bone density: Never   No Known Allergies  Current Outpatient Medications  Medication Sig Dispense Refill   Calcium  Citrate-Vitamin D (CALCIUM CITRATE + D PO) Take 1 tablet by mouth 2 (two) times daily.      ibuprofen (ADVIL) 800 MG tablet Take 1 tablet (800 mg total) by mouth every 8 (eight) hours as needed. 30 tablet 0   lidocaine-prilocaine (EMLA) cream Apply to affected area once 30 g 3   LORazepam (ATIVAN) 0.5 MG tablet Take 1 tablet (0.5 mg total) by mouth at bedtime as needed (Nausea or vomiting). 30 tablet 0   oxyCODONE (OXY IR/ROXICODONE) 5 MG immediate release tablet      prochlorperazine (COMPAZINE) 10 MG tablet Take 1 tablet (10 mg total) by mouth every 6 (six) hours as needed (Nausea or vomiting). 30 tablet 1   No current facility-administered medications for this visit.    OBJECTIVE: White Gilbert who appears younger than stated age 37:   01/05/20 1300  BP: 98/63  Pulse: 68  Resp: 18  Temp: 98.7 F (37.1 C)  SpO2: 99%     Body mass index is 22.63 kg/m.   Wt Readings from Last 3 Encounters:  01/05/20 138 lb 1.6 oz (62.6 kg)  12/28/19 136 lb 3.2 oz (61.8 kg)  12/21/19 137 lb (62.1 kg)  ECOG FS:1 - Symptomatic but completely ambulatory  GENERAL: Patient is a well appearing female in no acute distress HEENT:  Sclerae anicteric. Mask in place. . Neck is supple.  NODES:  No cervical, supraclavicular, or axillary lymphadenopathy palpated.  BREAST EXAM:  Deferred. LUNGS:  Clear to auscultation bilaterally.  No wheezes or rhonchi. HEART:  Regular rate and rhythm. No murmur appreciated. ABDOMEN:  Soft, nontender.  Positive, normoactive bowel sounds. No organomegaly palpated. MSK:  No focal spinal tenderness to palpation. Full range of motion bilaterally in the upper extremities. EXTREMITIES:  No peripheral edema.   SKIN:  Clear with no obvious rashes or skin changes. No nail dyscrasia. NEURO:  Nonfocal. Well oriented.  Appropriate affect.    LAB RESULTS:  CMP     Component Value Date/Time   NA 142 01/05/2020 1225   K 4.3 01/05/2020 1225   CL 110 01/05/2020 1225   CO2 23  01/05/2020 1225   GLUCOSE 90 01/05/2020 1225   BUN 12 01/05/2020 1225   CREATININE 0.70 01/05/2020 1225   CREATININE 0.79 08/04/2019 1546   CREATININE 0.76 03/27/2015 1146   CALCIUM 9.0 01/05/2020 1225   PROT 6.1 (L) 01/05/2020 1225   ALBUMIN 3.6 01/05/2020 1225   AST 28 01/05/2020 1225   AST 17 08/04/2019 1546   ALT 43 01/05/2020 1225   ALT 21 08/04/2019 1546   ALKPHOS 60 01/05/2020 1225   BILITOT 0.3 01/05/2020 1225   BILITOT 0.2 (L) 08/04/2019 1546   GFRNONAA >60 01/05/2020 1225   GFRNONAA >60 08/04/2019 1546   GFRNONAA 87 03/27/2015 1146   GFRAA >60 01/05/2020 1225   GFRAA >60 08/04/2019 1546   GFRAA >89 03/27/2015 1146    No results found for: TOTALPROTELP, ALBUMINELP, A1GS, A2GS, BETS, BETA2SER, GAMS, MSPIKE, SPEI  Lab Results  Component Value Date   WBC 3.8 (L) 01/05/2020   NEUTROABS 2.2 01/05/2020   HGB 11.7 (L) 01/05/2020   HCT 34.3 (L) 01/05/2020   MCV 87.3 01/05/2020   PLT 214 01/05/2020    No results found for: LABCA2  No components found for: YQMVHQ469  No results for input(s): INR in the last 168 hours.  No results found for: LABCA2  No results found for: CAN199  No results found for: GEX528  No results found for: UXL244  Lab Results  Component Value Date   CA2729 27.4 08/18/2019    No components found for: HGQUANT  No results found for: CEA1 / No results found for: CEA1   No results found for: AFPTUMOR  No results found for: CHROMOGRNA  No results found for: KPAFRELGTCHN, LAMBDASER, KAPLAMBRATIO (kappa/lambda light chains)  No results found for: HGBA, HGBA2QUANT, HGBFQUANT, HGBSQUAN (Hemoglobinopathy evaluation)   No results found for: LDH  Lab Results  Component Value Date   IRON 94 02/15/2015   TIBC 272 02/15/2015   IRONPCTSAT 35 02/15/2015   (Iron and TIBC)  Lab Results  Component Value Date   FERRITIN 70 02/15/2015    Urinalysis No results found for: COLORURINE, APPEARANCEUR, LABSPEC, PHURINE, GLUCOSEU, HGBUR,  BILIRUBINUR, KETONESUR, PROTEINUR, UROBILINOGEN, NITRITE, LEUKOCYTESUR   STUDIES: MR BREAST BILATERAL W WO CONTRAST INC CAD  Addendum Date: 01/07/2020   ADDENDUM REPORT: 01/07/2020 10:40 ADDENDUM: The 0.5-0.6 cm enhancing foci/masses within the central inferior RIGHT breast are now difficult to visualize. Electronically Signed   By: Margarette Canada M.D.   On: 01/07/2020 10:40   Result Date: 01/07/2020 CLINICAL DATA:  64 year old female for evaluation of neoadjuvant  therapy of RIGHT breast cancer LABS:  None performed today EXAM: BILATERAL BREAST MRI WITH AND WITHOUT CONTRAST TECHNIQUE: Multiplanar, multisequence MR images of both breasts were obtained prior to and following the intravenous administration of 6 ml of Gadavist Three-dimensional MR images were rendered by post-processing of the original MR data on an independent workstation. The three-dimensional MR images were interpreted, and findings are reported in the following complete MRI report for this study. Three dimensional images were evaluated at the independent DynaCad workstation COMPARISON:  11/01/2019 ultrasound, 08/15/2019 MR and prior studies FINDINGS: Breast composition: c. Heterogeneous fibroglandular tissue. Background parenchymal enhancement: Mild Right breast: Contiguous enhancement within the entire UPPER OUTER RIGHT breast has slightly decreased in size and fullness, now measuring approximately 6.5 x 3.2 x 6 cm (transverse x AP x CC), previously 7.6 x 3.9 x 5.9 cm on 08/15/2019. The 0.5-0.6 cm enhancing foci/masses within the central INFERIOR LEFT breast are now difficult to visualized. No new or increasing masses/enhancement noted. Left breast: No mass or abnormal enhancement. Lymph nodes: No abnormal appearing lymph nodes are noted. The previously enlarged RIGHT axillary lymph nodes are now all normal in size and appearance. Ancillary findings:  None. IMPRESSION: 1. Slight interval decrease in size and fullness UPPER OUTER RIGHT breast  malignancy, now measuring 6.5 x 3.2 x 6 cm, previously 7.6 x 3.9 x 5.9 cm on 08/15/2019. 2. The 0.5-0.6 cm enhancing foci/masses within the central INFERIOR LEFT breast are now difficult to visualize. 3. Interval resolution of RIGHT axillary lymph nodes. 4. No evidence of LEFT breast malignancy. RECOMMENDATION: Treatment plan BI-RADS CATEGORY  6: Known biopsy-proven malignancy. Electronically Signed: By: Margarette Canada M.D. On: 01/04/2020 15:07    ELIGIBLE FOR AVAILABLE RESEARCH PROTOCOL:   ASSESSMENT: 64 y.o. Paige Gilbert, Paige Gilbert status post right breast biopsy x2 on 07/13/2019 for a clinical T3-T4 N1-2, stage III invasive ductal carcinoma, grade 2, estrogen receptor positive, progesterone receptor variable, HER-2 not amplified.  (a) breast MRI 08/15/2019 showed a 7.6 area of enhancement, and 5 suspicious right axillary lymph nodes as well as two 0.5 mm additional masses in the inferior right breast   (1) neoadjuvant chemotherapy consisting of cyclophosphamide and doxorubicin in dose dense fashion x4 starting 08/18/2019, completed 09/28/2019, followed by weekly paclitaxel x12 starting 10/12/2019, to be completed 01/11/2020  (2) definitive surgery (right modified radical mastectomy) to follow  (3) adjuvant radiation as appropriate  (4) antiestrogens to start at the completion of local treatment   PLAN: Paige Gilbert will receive #11 of 12 doses of weekly neoadjuvant Paclitaxel.  We reviewed her breast MRI with her and I pulled up the pictures on the computer for her to see.  We reviewed that the tumor itself will be removed with surgery, and the chemotherapy goal was to remove any microscopic disease outside of the breast, which we are hopeful we achieved with her chemotherapy.  We planned next steps for her today.  She will see Dr. Brantley Stage next week.  She will return in one week for labs, f/u, and her final chemotherapy treatment, she will see Dr. Jana Hakim in about 6 weeks, and I also placed a referral  to Dr. Isidore Moos for radiation oncology consultation.    Paige Gilbert can have her port removed at the time of surgery.  She understands the appointments above, and is looking forward to finishing her chemotherapy next week.  She knows to call for any other issue that may develop before the next visit.  Total encounter time 30 minutes.Wilber Bihari,  NP 01/07/20 2:23 PM Medical Oncology and Hematology [facility] [place], [place] [address] Tel. [phone]    Fax. [phone]   ADDENDUM: We reviewed the MRI results with Paige Gilbert.  She did not have a dramatic response to the chemotherapy and indeed estrogen receptor positive tumors frequently do not.  But the chemotherapy accomplishes in these cases, or leaves the intention of the chemotherapy is to sterilize any disseminated microscopic disease.  This would be the more aggressive form of the cancer which would spread first.  The less aggressive form of course would have to be dealt with antiestrogens and once she completes her radiation we will start her on anastrozole.  She will receive her final paclitaxel dose next week assuming she does not develop any neuropathy and she will see me once we have the final results of her surgery.  She may have her port removed at any point at her discretion.  I personally saw this patient and performed a substantive portion of this encounter with the listed APP documented above.   Paige Cruel, MD Medical Oncology and Hematology [facility] [address] [place], [place] [address] Tel. [phone]    Fax. [phone]    *Total Encounter Time as defined by the Centers for Medicare and Medicaid Services includes, in addition to the face-to-face time of a patient visit (documented in the note above) non-face-to-face time: obtaining and reviewing outside history, ordering and reviewing medications, tests or procedures, care coordination (communications  with other health care professionals or caregivers) and documentation in the medical record.

## 2020-01-05 NOTE — Patient Instructions (Signed)
[facility] Discharge Instructions for Patients Receiving Chemotherapy  Today you received the following chemotherapy agents:  Taxol.  To help prevent nausea and vomiting after your treatment, we encourage you to take your nausea medication as directed.   If you develop nausea and vomiting that is not controlled by your nausea medication, call the clinic.   BELOW ARE SYMPTOMS THAT SHOULD BE REPORTED IMMEDIATELY:  *FEVER GREATER THAN 100.5 F  *CHILLS WITH OR WITHOUT FEVER  NAUSEA AND VOMITING THAT IS NOT CONTROLLED WITH YOUR NAUSEA MEDICATION  *UNUSUAL SHORTNESS OF BREATH  *UNUSUAL BRUISING OR BLEEDING  TENDERNESS IN MOUTH AND THROAT WITH OR WITHOUT PRESENCE OF ULCERS  *URINARY PROBLEMS  *BOWEL PROBLEMS  UNUSUAL RASH Items with * indicate a potential emergency and should be followed up as soon as possible.  Feel free to call the clinic should you have any questions or concerns. The clinic phone number is [phone].  Please show the CHEMO ALERT CARD at check-in to the Emergency Department and triage nurse.   

## 2020-01-06 ENCOUNTER — Telehealth: Payer: Self-pay | Admitting: Adult Health

## 2020-01-06 NOTE — Telephone Encounter (Signed)
Scheduled appts per 6/9 los. Left voicemail with appt date and time.

## 2020-01-10 ENCOUNTER — Ambulatory Visit: Payer: Self-pay | Admitting: Surgery

## 2020-01-10 LAB — SURGICAL PATHOLOGY

## 2020-01-10 NOTE — H&P (Signed)
Gaye Pollack Appointment: 01/10/2020 11:50 AM Location: [facility] Patient #: [id number] DOB: 31-May-1956 Married / Language: Cleophus Molt / Race: White Female  History of Present Illness Marcello Moores A. Lenord Fralix MD; 01/10/2020 12:30 PM) Patient words: Patient returns after neoadjuvant chemotherapy for locally aggressive stage III right breast cancer. She is complaining chemotherapy this week. She's had a modest response with significant reduction of her right axillary adenopathy and reduction by about 20-25% after reviewing magnetic resonance imaging. She is tolerating chemotherapy well. She has no complaints today.        1. Right Breast, upper-outer quadrant, closer to the nipple - invasive ductal carcinoma with micropapillary features, grade 2 - ductal carcinoma in situ, grade 2-3, solid and cribriform pattern with comedonecrosis - Prognostic indicators significant for: estrogen receptor, 100% positive with strong staining intensity and progesterone receptor, 0% negative. HER2 equivocal by immunohistochemistry (2+), but negative by fluorescent in situ hybridization with a signals ratio 1.5 and number per cell 3.8.  2. Right Breast, upper-outer quadrant, axillary tail - invasive ductal carcinoma, grade 2 - ductal carcinoma in situ, grade 2-3 - Prognostic indicators significant for: estrogen receptor, 100% positive and progesterone receptor, 50% positive, both with strong staining intensity. HER2 equivocal by immunohistochemistry (2+), but negative by fluorescent in situ hybridization with a signals ratio 1.9 and number per cell 5.3.   ADDENDUM: The 0.5-0.6 cm enhancing foci/masses within the central inferior RIGHT breast are now difficult to visualize. Electronically Signed By: Margarette Canada M.D. On: 01/07/2020 10:40  Signed by Margarette Canada, MD on 01/07/2020 10:42 AM Narrative & Impression CLINICAL DATA:  64 year old female for evaluation of neoadjuvant therapy of RIGHT breast cancer LABS: None performed today EXAM: BILATERAL BREAST MRI WITH AND WITHOUT CONTRAST TECHNIQUE: Multiplanar, multisequence MR images of both breasts were obtained prior to and following the intravenous administration of 6 ml of Gadavist Three-dimensional MR images were rendered by post-processing of the original MR data on an independent workstation. The three-dimensional MR images were interpreted, and findings are reported in the following complete MRI report for this study. Three dimensional images were evaluated at the independent DynaCad workstation COMPARISON: 11/01/2019 ultrasound, 08/15/2019 MR and prior studies FINDINGS: Breast composition: c. Heterogeneous fibroglandular tissue. Background parenchymal enhancement: Mild Right breast: Contiguous enhancement within the entire UPPER OUTER RIGHT breast has slightly decreased in size and fullness, now measuring approximately 6.5 x 3.2 x 6 cm (transverse x AP x CC), previously 7.6 x 3.9 x 5.9 cm on 08/15/2019. The 0.5-0.6 cm enhancing foci/masses within the central INFERIOR LEFT breast are now difficult to visualized. No new or increasing masses/enhancement noted. Left breast: No mass or abnormal enhancement. Lymph nodes: No abnormal appearing lymph nodes are noted. The previously enlarged RIGHT axillary lymph nodes are now all normal in size and appearance. Ancillary findings: None. IMPRESSION: 1. Slight interval decrease in size and fullness UPPER OUTER RIGHT breast malignancy, now measuring 6.5 x 3.2 x 6 cm, previously 7.6 x 3.9 x 5.9 cm on 08/15/2019. 2. The 0.5-0.6 cm enhancing foci/masses within the central INFERIOR LEFT breast are now difficult to visualize. 3. Interval resolution of RIGHT axillary lymph nodes. 4. No evidence of LEFT breast malignancy. RECOMMENDATION: Treatment plan BI-RADS CATEGORY 6: Known biopsy-proven malignancy.  Electronically Signed: By: Margarette Canada M.D. On: 01/04/2020 15:07.  The patient is a 64 year old female.   Allergies (Chanel Teressa Senter, CMA; 01/10/2020 11:39 AM) No Known Drug Allergies [08/09/2019]: Allergies Reconciled  Medication History (Chanel Teressa Senter, CMA; 01/10/2020 11:39 AM) No Current Medications Medications Reconciled  Vitals (Chanel Nolan CMA; 01/10/2020 11:39 AM) 01/10/2020 11:39 AM Weight: 137.13 lb Height: 64.5in Body Surface Area: 1.68 m Body Mass Index: 23.17 kg/m  Temp.: 97.60F  Pulse: 96 (Regular)  BP: 126/72(Sitting, Left Arm, Standard)        Physical Exam (Darrion Wyszynski A. Takirah Binford MD; 01/10/2020 12:30 PM)  General Mental Status-Alert. General Appearance-Consistent with stated age. Hydration-Well hydrated. Voice-Normal.  Breast Note: The right breast mass upper outer quadrant is about 7 cm in size and is not fixed to the underlying chest wall. The right nipple retraction is much improved. The right axillary adenopathy has resolved. Left breast is normal.  Neurologic Neurologic evaluation reveals -alert and oriented x 3 with no impairment of recent or remote memory. Mental Status-Normal.  Lymphatic Head & Neck  General Head & Neck Lymphatics: Bilateral - Description - Normal. Axillary  General Axillary Region: Bilateral - Description - Normal. Tenderness - Non Tender.    Assessment & Plan (Dhruvi Crenshaw A. Deniah Saia MD; 01/10/2020 12:31 PM)  BREAST CANCER, RIGHT (C50.911) Impression: Total time 45 minutes Discussed right modified radical mastectomy given previous findings with bulky adenopathy in the right axilla and large tumor burden right breast was significant skin involvement preoperatively. She has no interest in reconstruction. Also discuss port removal since she should be done with chemotherapy. Discussed treatment options for breast cancer to include breast conservation vs mastectomy with reconstruction. Pt has decided on  mastectomy. Risk include bleeding, infection, flap necrosis, pain, numbness, recurrence, hematoma, other surgery needs. Pt understands and agrees to proceed.  Current Plans You are being scheduled for surgery- Our schedulers will call you.  You should hear from our office's scheduling department within 5 working days about the location, date, and time of surgery. We try to make accommodations for patient's preferences in scheduling surgery, but sometimes the OR schedule or the surgeon's schedule prevents [place] from making those accommodations.  If you have not heard from our office [phone]) in 5 working days, call the office and ask for your surgeon's nurse.  If you have other questions about your diagnosis, plan, or surgery, call the office and ask for your surgeon's nurse.  Pt Education - CCS Mastectomy HCI Pt Education - ABC (After Breast Cancer) Class Info: discussed with patient and provided information.

## 2020-01-11 ENCOUNTER — Inpatient Hospital Stay: Payer: Managed Care, Other (non HMO)

## 2020-01-11 ENCOUNTER — Encounter: Payer: Self-pay | Admitting: Oncology

## 2020-01-11 ENCOUNTER — Encounter: Payer: Self-pay | Admitting: *Deleted

## 2020-01-11 ENCOUNTER — Inpatient Hospital Stay (HOSPITAL_BASED_OUTPATIENT_CLINIC_OR_DEPARTMENT_OTHER): Payer: Managed Care, Other (non HMO) | Admitting: Adult Health

## 2020-01-11 ENCOUNTER — Other Ambulatory Visit: Payer: Self-pay

## 2020-01-11 ENCOUNTER — Encounter: Payer: Self-pay | Admitting: Adult Health

## 2020-01-11 VITALS — BP 110/62 | HR 68 | Temp 98.5°F | Resp 18 | Ht 65.5 in | Wt 138.2 lb

## 2020-01-11 DIAGNOSIS — Z5111 Encounter for antineoplastic chemotherapy: Secondary | ICD-10-CM | POA: Diagnosis not present

## 2020-01-11 DIAGNOSIS — Z170 Estrogen receptor positive status [ER+]: Secondary | ICD-10-CM | POA: Diagnosis not present

## 2020-01-11 DIAGNOSIS — C50411 Malignant neoplasm of upper-outer quadrant of right female breast: Secondary | ICD-10-CM | POA: Diagnosis not present

## 2020-01-11 LAB — CBC WITH DIFFERENTIAL/PLATELET
Abs Immature Granulocytes: 0.01 10*3/uL (ref 0.00–0.07)
Basophils Absolute: 0.1 10*3/uL (ref 0.0–0.1)
Basophils Relative: 2 %
Eosinophils Absolute: 0.1 10*3/uL (ref 0.0–0.5)
Eosinophils Relative: 2 %
HCT: 33.2 % — ABNORMAL LOW (ref 36.0–46.0)
Hemoglobin: 11.1 g/dL — ABNORMAL LOW (ref 12.0–15.0)
Immature Granulocytes: 0 %
Lymphocytes Relative: 31 %
Lymphs Abs: 1 10*3/uL (ref 0.7–4.0)
MCH: 29.7 pg (ref 26.0–34.0)
MCHC: 33.4 g/dL (ref 30.0–36.0)
MCV: 88.8 fL (ref 80.0–100.0)
Monocytes Absolute: 0.2 10*3/uL (ref 0.1–1.0)
Monocytes Relative: 5 %
Neutro Abs: 1.9 10*3/uL (ref 1.7–7.7)
Neutrophils Relative %: 60 %
Platelets: 169 10*3/uL (ref 150–400)
RBC: 3.74 MIL/uL — ABNORMAL LOW (ref 3.87–5.11)
RDW: 14 % (ref 11.5–15.5)
WBC: 3.2 10*3/uL — ABNORMAL LOW (ref 4.0–10.5)
nRBC: 0 % (ref 0.0–0.2)

## 2020-01-11 LAB — COMPREHENSIVE METABOLIC PANEL
ALT: 34 U/L (ref 0–44)
AST: 23 U/L (ref 15–41)
Albumin: 3.7 g/dL (ref 3.5–5.0)
Alkaline Phosphatase: 59 U/L (ref 38–126)
Anion gap: 10 (ref 5–15)
BUN: 13 mg/dL (ref 8–23)
CO2: 22 mmol/L (ref 22–32)
Calcium: 8.6 mg/dL — ABNORMAL LOW (ref 8.9–10.3)
Chloride: 109 mmol/L (ref 98–111)
Creatinine, Ser: 0.7 mg/dL (ref 0.44–1.00)
GFR calc Af Amer: >60 mL/min (ref >60)
GFR calc non Af Amer: >60 mL/min (ref >60)
Glucose, Bld: 96 mg/dL (ref 70–99)
Potassium: 4.1 mmol/L (ref 3.5–5.1)
Sodium: 141 mmol/L (ref 135–145)
Total Bilirubin: 0.4 mg/dL (ref 0.3–1.2)
Total Protein: 6.1 g/dL — ABNORMAL LOW (ref 6.5–8.1)

## 2020-01-11 MED ORDER — DIPHENHYDRAMINE HCL 50 MG/ML IJ SOLN
25.0000 mg | Freq: Once | INTRAMUSCULAR | Status: AC
  Administered 2020-01-11: 25 mg via INTRAVENOUS

## 2020-01-11 MED ORDER — HEPARIN SOD (PORK) LOCK FLUSH 100 UNIT/ML IV SOLN
500.0000 [IU] | Freq: Once | INTRAVENOUS | Status: AC | PRN
  Administered 2020-01-11: 500 [IU]
  Filled 2020-01-11: qty 5

## 2020-01-11 MED ORDER — DEXAMETHASONE SODIUM PHOSPHATE 10 MG/ML IJ SOLN
INTRAMUSCULAR | Status: AC
  Filled 2020-01-11: qty 1

## 2020-01-11 MED ORDER — DIPHENHYDRAMINE HCL 50 MG/ML IJ SOLN
INTRAMUSCULAR | Status: AC
  Filled 2020-01-11: qty 1

## 2020-01-11 MED ORDER — SODIUM CHLORIDE 0.9 % IV SOLN
Freq: Once | INTRAVENOUS | Status: AC
  Filled 2020-01-11: qty 250

## 2020-01-11 MED ORDER — DEXAMETHASONE SODIUM PHOSPHATE 10 MG/ML IJ SOLN
4.0000 mg | Freq: Once | INTRAMUSCULAR | Status: AC
  Administered 2020-01-11: 4 mg via INTRAVENOUS

## 2020-01-11 MED ORDER — FAMOTIDINE IN NACL 20-0.9 MG/50ML-% IV SOLN
INTRAVENOUS | Status: AC
  Filled 2020-01-11: qty 50

## 2020-01-11 MED ORDER — FAMOTIDINE IN NACL 20-0.9 MG/50ML-% IV SOLN
20.0000 mg | Freq: Once | INTRAVENOUS | Status: AC
  Administered 2020-01-11: 20 mg via INTRAVENOUS

## 2020-01-11 MED ORDER — SODIUM CHLORIDE 0.9% FLUSH
10.0000 mL | INTRAVENOUS | Status: DC | PRN
  Administered 2020-01-11: 10 mL
  Filled 2020-01-11: qty 10

## 2020-01-11 MED ORDER — SODIUM CHLORIDE 0.9 % IV SOLN
80.0000 mg/m2 | Freq: Once | INTRAVENOUS | Status: AC
  Administered 2020-01-11: 138 mg via INTRAVENOUS
  Filled 2020-01-11: qty 23

## 2020-01-11 NOTE — Progress Notes (Signed)
Faxed request to pathology @ [facility], [phone] attention Sabrina, confirmation received. Advised them to send material to [facility] at [facility] pathology department

## 2020-01-11 NOTE — Patient Instructions (Signed)
[facility] Discharge Instructions for Patients Receiving Chemotherapy  Today you received the following chemotherapy agents:  Taxol.  To help prevent nausea and vomiting after your treatment, we encourage you to take your nausea medication as directed.   If you develop nausea and vomiting that is not controlled by your nausea medication, call the clinic.   BELOW ARE SYMPTOMS THAT SHOULD BE REPORTED IMMEDIATELY:  *FEVER GREATER THAN 100.5 F  *CHILLS WITH OR WITHOUT FEVER  NAUSEA AND VOMITING THAT IS NOT CONTROLLED WITH YOUR NAUSEA MEDICATION  *UNUSUAL SHORTNESS OF BREATH  *UNUSUAL BRUISING OR BLEEDING  TENDERNESS IN MOUTH AND THROAT WITH OR WITHOUT PRESENCE OF ULCERS  *URINARY PROBLEMS  *BOWEL PROBLEMS  UNUSUAL RASH Items with * indicate a potential emergency and should be followed up as soon as possible.  Feel free to call the clinic should you have any questions or concerns. The clinic phone number is [phone].  Please show the CHEMO ALERT CARD at check-in to the Emergency Department and triage nurse.   

## 2020-01-11 NOTE — Progress Notes (Signed)
[place]  [phone] [phone]     ID: Paige Gilbert DOB: 09-01-1955  MR#: [id number]  POE#:[id number]  Patient Care Team: [name], NP as PCP - General (Family Medicine) [name], RN as Oncology Nurse Navigator [name], RN as Oncology Nurse Navigator Magrinat, [name], MD as Consulting Physician (Oncology) [name], MD as Attending Physician (Radiation Oncology) [name], MD as Consulting Physician (General Surgery) Scot Dock, NP OTHER MD:  CHIEF COMPLAINT: Locally advanced estrogen receptor positive breast cancer  CURRENT TREATMENT: Neoadjuvant chemotherapy   INTERVAL HISTORY: Paige Gilbert returns today for follow up and treatment of her locally advanced breast cancer.  She continues on weekly paclitaxel. Today is week 12 of 12 planned treatments.  Importantly, she has no peripheral neuropathy.    Paige Gilbert surgery for her breast cancer was scheduled for 02/17/2020.  She is anxious about this date, because of the length of time between her chemotherapy completion (today) and her surgery is 5 weeks.    REVIEW OF SYSTEMS: Paige Gilbert is feeling well today.  She has no fever, chills, chest pain, palpitations, cough, shortness of breath, bowel/bladder changes, headaches, mucositis.  Her energy level and appetite are stable.  A detailed ROS was otherwise non contributory today.   HISTORY OF CURRENT ILLNESS: From the original intake note:  Paige Gilbert has noted a difference between her right and left breast for several years.  She last had a mammogram she thinks about 7 or 8 years ago.  More recently it seemed to her that her right breast was becoming more firm.  She brought it to medical attention and underwent bilateral diagnostic mammography with tomography and right breast ultrasonography at [facility] on 07/07/2019 showing: heterogeneously dense breast tissue; right nipple retraction;  architectural distortion with microcalcifications and irregular dense tissue in the upper-outer quadrant; at least 3 abnormal right axillary lymph nodes.  Accordingly on 07/13/2019 she proceeded to biopsy of the right breast area in question. The pathology from this procedure ([id number]) showed:   1. Right Breast, upper-outer quadrant, closer to the nipple  - invasive ductal carcinoma with micropapillary features, grade 2  - ductal carcinoma in situ, grade 2-3, solid and cribriform pattern with comedonecrosis  - Prognostic indicators significant for: estrogen receptor, 100% positive with strong staining intensity and progesterone receptor, 0% negative. HER2 equivocal by immunohistochemistry (2+), but negative by fluorescent in situ hybridization with a signals ratio 1.5 and number per cell 3.8.  2. Right Breast, upper-outer quadrant, axillary tail  - invasive ductal carcinoma, grade 2  - ductal carcinoma in situ, grade 2-3  - Prognostic indicators significant for: estrogen receptor, 100% positive and progesterone receptor, 50% positive, both with strong staining intensity. HER2 equivocal by immunohistochemistry (2+), but negative by fluorescent in situ hybridization with a signals ratio 1.9 and number per cell 5.3.  The patient's subsequent history is as detailed below.   PAST MEDICAL HISTORY: Past Medical History:  Diagnosis Date  . Anemia   . right breast ca dx'd 06/2019  . Varicose vein   . Varicose veins of left lower extremity    "bluging ones at top- no pain"    PAST SURGICAL HISTORY: Past Surgical History:  Procedure Laterality Date  . NO PAST SURGERIES    . none    . PORTACATH PLACEMENT Right 08/17/2019   Procedure: INSERTION PORT-A-CATH WITH ULTRASOUND;  Surgeon: [name], MD;  Location: [place];  Service: General;  Laterality: Right;  . wisdom teeth  removal     remotely.     FAMILY HISTORY: Family History  Problem Relation Age of Onset  . Diabetes Father   .  Blindness Mother   . Congestive Heart Failure Sister   . Gallbladder disease Sister        removed  . Diabetes Sister   . Colitis Sister    The patient's father died at age 75 from a stroke.  The patient's mother died from multiple medical problems not related to cancer at age 52.  The patient had 4 brothers, 7 sisters.  There is no breast ovarian or prostate cancer in the family to her knowledge.  She had one cousin with pancreatic cancer.   GYNECOLOGIC HISTORY:  No LMP recorded. Patient is postmenopausal. Menarche: 64 years old Age at first live birth: 64 years old [place] P 5 LMP 49 Contraceptive: Brief use of oral contraceptives as a teenager HRT no Hysterectomy?  No BSO?    SOCIAL HISTORY: (updated 07/2019)  Paige Gilbert works in a Engineer, agricultural, and Education administrator houses.  Her husband of 73 years Paige Gilbert is a Chief Operating Officer.  Their children are Paige Gilbert, 105, who lives in [place] and is a Veterinary surgeon in the [employer]; Paige Gilbert 33 who works as a Designer, jewellery at [employer]; Paige Gilbert who lives in [place], and is not currently employed; Paige Gilbert lives in [place], and Paige Gilbert who is an Armed forces training and education officer in [place].  The patient has 4 grandchildren all under the age of 7    ADVANCED DIRECTIVES: Husband Paige Gilbert is her HCPOA.   HEALTH MAINTENANCE: Social History   Tobacco Use  . Smoking status: Never Smoker  . Smokeless tobacco: Never Used  Vaping Use  . Vaping Use: Never used  Substance Use Topics  . Alcohol use: Yes    Comment: rarely  . Drug use: No     Colonoscopy: never done  PAP: Remote  Bone density: Never   No Known Allergies  Current Outpatient Medications  Medication Sig Dispense Refill  . Calcium Citrate-Vitamin D (CALCIUM CITRATE + D PO) Take 1 tablet by mouth 2 (two) times daily.     Marland Kitchen ibuprofen (ADVIL) 800 MG tablet Take 1 tablet (800 mg total) by mouth every 8 (eight) hours as needed. 30 tablet 0  . lidocaine-prilocaine (EMLA) cream  Apply to affected area once 30 g 3  . LORazepam (ATIVAN) 0.5 MG tablet Take 1 tablet (0.5 mg total) by mouth at bedtime as needed (Nausea or vomiting). 30 tablet 0  . oxyCODONE (OXY IR/ROXICODONE) 5 MG immediate release tablet     . prochlorperazine (COMPAZINE) 10 MG tablet Take 1 tablet (10 mg total) by mouth every 6 (six) hours as needed (Nausea or vomiting). 30 tablet 1   No current facility-administered medications for this visit.    OBJECTIVE: White woman who appears younger than stated age 21:   01/11/20 1311  BP: 110/62  Pulse: 68  Resp: 18  Temp: 98.5 F (36.9 C)  SpO2: 100%     Body mass index is 22.65 kg/m.   Wt Readings from Last 3 Encounters:  01/11/20 138 lb 3.2 oz (62.7 kg)  01/05/20 138 lb 1.6 oz (62.6 kg)  12/28/19 136 lb 3.2 oz (61.8 kg)  ECOG FS:1 - Symptomatic but completely ambulatory  GENERAL: Patient is a well appearing female in no acute distress HEENT:  Sclerae anicteric. Mask in place. . Neck is supple.  NODES:  No cervical, supraclavicular, or axillary lymphadenopathy palpated.  BREAST  EXAM:  Deferred. LUNGS:  Clear to auscultation bilaterally.  No wheezes or rhonchi. HEART:  Regular rate and rhythm. No murmur appreciated. ABDOMEN:  Soft, nontender.  Positive, normoactive bowel sounds. No organomegaly palpated. MSK:  No focal spinal tenderness to palpation. Full range of motion bilaterally in the upper extremities. EXTREMITIES:  No peripheral edema.   SKIN:  Clear with no obvious rashes or skin changes. No nail dyscrasia. NEURO:  Nonfocal. Well oriented.  Appropriate affect.    LAB RESULTS:  CMP     Component Value Date/Time   NA 142 01/05/2020 1225   K 4.3 01/05/2020 1225   CL 110 01/05/2020 1225   CO2 23 01/05/2020 1225   GLUCOSE 90 01/05/2020 1225   BUN 12 01/05/2020 1225   CREATININE 0.70 01/05/2020 1225   CREATININE 0.79 08/04/2019 1546   CREATININE 0.76 03/27/2015 1146   CALCIUM 9.0 01/05/2020 1225   PROT 6.1 (L) 01/05/2020  1225   ALBUMIN 3.6 01/05/2020 1225   AST 28 01/05/2020 1225   AST 17 08/04/2019 1546   ALT 43 01/05/2020 1225   ALT 21 08/04/2019 1546   ALKPHOS 60 01/05/2020 1225   BILITOT 0.3 01/05/2020 1225   BILITOT 0.2 (L) 08/04/2019 1546   GFRNONAA >60 01/05/2020 1225   GFRNONAA >60 08/04/2019 1546   GFRNONAA 87 03/27/2015 1146   GFRAA >60 01/05/2020 1225   GFRAA >60 08/04/2019 1546   GFRAA >89 03/27/2015 1146    No results found for: TOTALPROTELP, ALBUMINELP, A1GS, A2GS, BETS, BETA2SER, GAMS, MSPIKE, SPEI  Lab Results  Component Value Date   WBC 3.2 (L) 01/11/2020   NEUTROABS 1.9 01/11/2020   HGB 11.1 (L) 01/11/2020   HCT 33.2 (L) 01/11/2020   MCV 88.8 01/11/2020   PLT 169 01/11/2020    No results found for: LABCA2  No components found for: HTDSKA768  No results for input(s): INR in the last 168 hours.  No results found for: LABCA2  No results found for: CAN199  No results found for: TLX726  No results found for: OMB559  Lab Results  Component Value Date   CA2729 27.4 08/18/2019    No components found for: HGQUANT  No results found for: CEA1 / No results found for: CEA1   No results found for: AFPTUMOR  No results found for: CHROMOGRNA  No results found for: KPAFRELGTCHN, LAMBDASER, KAPLAMBRATIO (kappa/lambda light chains)  No results found for: HGBA, HGBA2QUANT, HGBFQUANT, HGBSQUAN (Hemoglobinopathy evaluation)   No results found for: LDH  Lab Results  Component Value Date   IRON 94 02/15/2015   TIBC 272 02/15/2015   IRONPCTSAT 35 02/15/2015   (Iron and TIBC)  Lab Results  Component Value Date   FERRITIN 70 02/15/2015    Urinalysis No results found for: COLORURINE, APPEARANCEUR, LABSPEC, PHURINE, GLUCOSEU, HGBUR, BILIRUBINUR, KETONESUR, PROTEINUR, UROBILINOGEN, NITRITE, LEUKOCYTESUR   STUDIES: MR BREAST BILATERAL W WO CONTRAST INC CAD  Addendum Date: 01/07/2020   ADDENDUM REPORT: 01/07/2020 10:40 ADDENDUM: The 0.5-0.6 cm enhancing  foci/masses within the central inferior RIGHT breast are now difficult to visualize. Electronically Signed   By: Margarette Canada M.D.   On: 01/07/2020 10:40   Result Date: 01/07/2020 CLINICAL DATA:  64 year old female for evaluation of neoadjuvant therapy of RIGHT breast cancer LABS:  None performed today EXAM: BILATERAL BREAST MRI WITH AND WITHOUT CONTRAST TECHNIQUE: Multiplanar, multisequence MR images of both breasts were obtained prior to and following the intravenous administration of 6 ml of Gadavist Three-dimensional MR images were rendered by post-processing of  the original MR data on an independent workstation. The three-dimensional MR images were interpreted, and findings are reported in the following complete MRI report for this study. Three dimensional images were evaluated at the independent DynaCad workstation COMPARISON:  11/01/2019 ultrasound, 08/15/2019 MR and prior studies FINDINGS: Breast composition: c. Heterogeneous fibroglandular tissue. Background parenchymal enhancement: Mild Right breast: Contiguous enhancement within the entire UPPER OUTER RIGHT breast has slightly decreased in size and fullness, now measuring approximately 6.5 x 3.2 x 6 cm (transverse x AP x CC), previously 7.6 x 3.9 x 5.9 cm on 08/15/2019. The 0.5-0.6 cm enhancing foci/masses within the central INFERIOR LEFT breast are now difficult to visualized. No new or increasing masses/enhancement noted. Left breast: No mass or abnormal enhancement. Lymph nodes: No abnormal appearing lymph nodes are noted. The previously enlarged RIGHT axillary lymph nodes are now all normal in size and appearance. Ancillary findings:  None. IMPRESSION: 1. Slight interval decrease in size and fullness UPPER OUTER RIGHT breast malignancy, now measuring 6.5 x 3.2 x 6 cm, previously 7.6 x 3.9 x 5.9 cm on 08/15/2019. 2. The 0.5-0.6 cm enhancing foci/masses within the central INFERIOR LEFT breast are now difficult to visualize. 3. Interval resolution of  RIGHT axillary lymph nodes. 4. No evidence of LEFT breast malignancy. RECOMMENDATION: Treatment plan BI-RADS CATEGORY  6: Known biopsy-proven malignancy. Electronically Signed: By: Margarette Canada M.D. On: 01/04/2020 15:07    ELIGIBLE FOR AVAILABLE RESEARCH PROTOCOL:   ASSESSMENT: 64 y.o. [place], [place] woman status post right breast biopsy x2 on 07/13/2019 for a clinical T3-T4 N1-2, stage III invasive ductal carcinoma, grade 2, estrogen receptor positive, progesterone receptor variable, HER-2 not amplified.  (a) breast MRI 08/15/2019 showed a 7.6 area of enhancement, and 5 suspicious right axillary lymph nodes as well as two 0.5 mm additional masses in the inferior right breast   (1) neoadjuvant chemotherapy consisting of cyclophosphamide and doxorubicin in dose dense fashion x4 starting 08/18/2019, completed 09/28/2019, followed by weekly paclitaxel x12 starting 10/12/2019, to be completed 01/11/2020  (2) definitive surgery (right modified radical mastectomy) to follow  (3) adjuvant radiation as appropriate  (4) antiestrogens to start at the completion of local treatment   PLAN: Mykira is feeling well today.  She continues on weekly Paclitaxel chemotherapy and will complete this today.  She has tolerated her neoadjuvant chemotherapy very well and has had no peripheral neuropathy which is great.    Laterica will be ready for surgery in 2-3 weeks.  She can have her port removed at that time.  Since her surgery is scheduled for 7/22, I sent a message to the oncology team, including Dr. Brantley Stage to see if it could be arranged sooner.  Adeana is very appreciative, as she is nervous about too much of a time lapse between her chemotherapy and surgery.    Laiklyn will return following her surgery.  This date is tentatively set for 02/21/2020, we will see when her surgery is scheduled and then go from there.    I recommended healthy diet and exercise in the interim to help reverse some of the  deconditioning often experienced by patients during chemotherapy.    She knows to call for any questions that may arise between now and her next appointment.  We are happy to see her sooner if needed.  Total encounter time 20 minutes.Wilber Bihari, NP 01/11/20 1:15 PM Medical Oncology and Hematology [facility] [place], [place] [address] Tel. [phone]    Fax.  [phone]  *Total Encounter Time as defined by the Centers for Medicare and Medicaid Services includes, in addition to the face-to-face time of a patient visit (documented in the note above) non-face-to-face time: obtaining and reviewing outside history, ordering and reviewing medications, tests or procedures, care coordination (communications with other health care professionals or caregivers) and documentation in the medical record.

## 2020-01-12 ENCOUNTER — Telehealth: Payer: Self-pay | Admitting: Oncology

## 2020-01-12 ENCOUNTER — Telehealth: Payer: Self-pay | Admitting: Adult Health

## 2020-01-12 NOTE — Telephone Encounter (Signed)
No 5/14 los. No changes made to pt's schedule.

## 2020-01-12 NOTE — Telephone Encounter (Signed)
Called pt per 6/16 sch message - pt unable to do that date and time - message sent back to RN

## 2020-01-13 ENCOUNTER — Telehealth: Payer: Self-pay | Admitting: Oncology

## 2020-01-13 NOTE — Telephone Encounter (Signed)
R/s appt per 6/16 sch message - unable to reach pt . Left message with apt date and time

## 2020-01-18 ENCOUNTER — Encounter: Payer: Self-pay | Admitting: *Deleted

## 2020-01-18 ENCOUNTER — Encounter: Payer: Self-pay | Admitting: Physical Therapy

## 2020-01-18 ENCOUNTER — Ambulatory Visit: Payer: Managed Care, Other (non HMO) | Attending: Surgery | Admitting: Physical Therapy

## 2020-01-18 ENCOUNTER — Other Ambulatory Visit: Payer: Self-pay

## 2020-01-18 DIAGNOSIS — R293 Abnormal posture: Secondary | ICD-10-CM | POA: Diagnosis not present

## 2020-01-18 DIAGNOSIS — C50911 Malignant neoplasm of unspecified site of right female breast: Secondary | ICD-10-CM | POA: Insufficient documentation

## 2020-01-18 DIAGNOSIS — Z170 Estrogen receptor positive status [ER+]: Secondary | ICD-10-CM | POA: Insufficient documentation

## 2020-01-18 NOTE — Therapy (Signed)
[place], [place], [address] Phone: [phone]   Fax:  [phone]  Physical Therapy Evaluation  Patient Details  Name: Paige Gilbert MRN: [id number] Date of Birth: 1956-04-06 Referring Provider (PT): Cornett   Encounter Date: 01/18/2020   PT End of Session - 01/18/20 1340    Visit Number 1    Number of Visits 2    Date for PT Re-Evaluation 03/14/20    PT Start Time 1307    PT Stop Time 1338    PT Time Calculation (min) 31 min    Activity Tolerance Patient tolerated treatment well    Behavior During Therapy [facility] for tasks assessed/performed           Past Medical History:  Diagnosis Date   Anemia    right breast ca dx'd 06/2019   Varicose vein    Varicose veins of left lower extremity    "bluging ones at top- no pain"    Past Surgical History:  Procedure Laterality Date   NO PAST SURGERIES     none     PORTACATH PLACEMENT Right 08/17/2019   Procedure: INSERTION PORT-A-CATH WITH ULTRASOUND;  Surgeon: [name], MD;  Location: [place];  Service: General;  Laterality: Right;   wisdom teeth removal     remotely.     There were no vitals filed for this visit.    Subjective Assessment - 01/18/20 1309    Subjective I currently do not have any issues with my shoulders.    Pertinent History R breast cancer, completed chemotherapy, DCIS grade 2-3, ER+ PR+    Patient Stated Goals reduce lymphedema risk and learn post op exercises    Currently in Pain? No/denies    Pain Score 0-No pain              [facility] PT Assessment - 01/18/20 0001      Assessment   Medical Diagnosis right breast cancer    Referring Provider (PT) Cornett    Onset Date/Surgical Date 02/17/20    Hand Dominance Right    Prior Therapy none      Precautions   Precautions Other (comment)    Precaution Comments active cancer      Restrictions   Weight Bearing Restrictions No      Balance Screen    Has the patient fallen in the past 6 months No    Has the patient had a decrease in activity level because of a fear of falling?  No    Is the patient reluctant to leave their home because of a fear of falling?  No      Home Environment   Living Environment Private residence    Living Arrangements Spouse/significant other    Available Help at Discharge Family    Type of [place]      Prior Function   Level of Independence Independent    Vocation Part time employment    Vocation Requirements clean houses    Leisure pt walks a mile a day      Cognition   Overall Cognitive Status Within Functional Limits for tasks assessed      Posture/Postural Control   Posture/Postural Control Postural limitations    Postural Limitations Rounded Shoulders;Forward head      ROM / Strength   AROM / PROM / Strength AROM      AROM   AROM Assessment Site Shoulder    Right/Left Shoulder Right;Left    Right Shoulder  Flexion 155 Degrees    Right Shoulder ABduction 175 Degrees    Right Shoulder Internal Rotation 60 Degrees    Right Shoulder External Rotation 90 Degrees    Left Shoulder Flexion 161 Degrees    Left Shoulder ABduction 178 Degrees    Left Shoulder Internal Rotation 42 Degrees    Left Shoulder External Rotation 90 Degrees             LYMPHEDEMA/ONCOLOGY QUESTIONNAIRE - 01/18/20 0001      Type   Cancer Type R breast cancer      Surgeries   Mastectomy Date 02/17/20      Treatment   Active Chemotherapy Treatment No    Past Chemotherapy Treatment Yes    Active Radiation Treatment No   will begin after surgery   Past Radiation Treatment No      Lymphedema Assessments   Lymphedema Assessments Upper extremities      Right Upper Extremity Lymphedema   15 cm Proximal to Olecranon Process 26 cm    Olecranon Process 24.1 cm    15 cm Proximal to Ulnar Styloid Process 23.2 cm    Just Proximal to Ulnar Styloid Process 16 cm    Across Hand at [employer] 19 cm    At [place] of  2nd Digit 6 cm      Left Upper Extremity Lymphedema   15 cm Proximal to Olecranon Process 25.7 cm    Olecranon Process 24.1 cm    15 cm Proximal to Ulnar Styloid Process 23 cm    Just Proximal to Ulnar Styloid Process 16 cm    Across Hand at [employer] 18.7 cm    At [place] of 2nd Digit 5.9 cm           L-DEX FLOWSHEETS - 01/18/20 1300      L-DEX LYMPHEDEMA SCREENING   Measurement Type Unilateral    L-DEX MEASUREMENT EXTREMITY Upper Extremity    POSITION  Standing    DOMINANT SIDE Right    At Risk Side Right    BASELINE SCORE (UNILATERAL) -5.8           The patient was assessed using the L-Dex machine today to produce a lymphedema index baseline score. The patient will be reassessed on a regular basis (typically every 3 months) to obtain new L-Dex scores. If the score is > 6.5 points away from his/her baseline score indicating onset of subclinical lymphedema, it will be recommended to wear a compression garment for 4 weeks, 12 hours per day and then be reassessed. If the score continues to be > 6.5 points from baseline at reassessment, we will initiate lymphedema treatment. Assessing in this manner has a 95% rate of preventing clinically significant lymphedema.      Katina Dung - 01/18/20 0001    Open a tight or new jar Mild difficulty    Do heavy household chores (wash walls, wash floors) No difficulty    Carry a shopping bag or briefcase No difficulty    Wash your back No difficulty    Use a knife to cut food No difficulty    Recreational activities in which you take some force or impact through your arm, shoulder, or hand (golf, hammering, tennis) No difficulty    During the past week, to what extent has your arm, shoulder or hand problem interfered with your normal social activities with family, friends, neighbors, or groups? Not at all    During the past week, to what extent  has your arm, shoulder or hand problem limited your work or other regular daily activities Not at  all    Arm, shoulder, or hand pain. None    Tingling (pins and needles) in your arm, shoulder, or hand None    Difficulty Sleeping No difficulty    DASH Score 2.27 %            Objective measurements completed on examination: See above findings.     Patient was instructed today in a home exercise program today for post op shoulder range of motion. These included active assist shoulder flexion in sitting, scapular retraction, wall walking with shoulder abduction, and hands behind head external rotation.  She was encouraged to do these twice a day, holding 3 seconds and repeating 5 times when permitted by her physician.              PT Education - 01/18/20 1339    Education Details ABC class, lymphedema risk reduction practices, anatomy and physiology of lymphatic system, post op breast exercises    Person(s) Educated Patient    Methods Explanation;Handout    Comprehension Verbalized understanding               PT Long Term Goals - 01/18/20 1346      PT LONG TERM GOAL #1   Title Pt will demonstrate she has regained full shoulder ROM and function post operatively compared to baselines.    Time 8    Period Weeks    Status New    Target Date 03/14/20           Breast Clinic Goals - 01/18/20 1345      Patient will be able to verbalize understanding of pertinent lymphedema risk reduction practices relevant to her diagnosis specifically related to skin care.   Status Achieved      Patient will be able to return demonstrate and/or verbalize understanding of the post-op home exercise program related to regaining shoulder range of motion.   Status Achieved      Patient will be able to verbalize understanding of the importance of attending the postoperative After Breast Cancer Class for further lymphedema risk reduction education and therapeutic exercise.   Status Achieved                 Plan - 01/18/20 1341    Clinical Impression Statement Pt presents to  PT with newly diagnosed R breast cancer and will undergo a R mastectomy and SLNB on 02/17/20. She has completed chemotherapy and will begin radiation. Pt's current shoulder ROM is WFL. She will benefit form a post op PT reassessment to determine needs and from L-Dex screenings every 3 months to detect subclinical lymphedema.    Stability/Clinical Decision Making Stable/Uncomplicated    Clinical Decision Making Low    Rehab Potential Excellent    PT Frequency --   eval and 1 f/u appointment then L-Dex screenings every 3 months   PT Duration 8 weeks    PT Treatment/Interventions ADLs/Self Care Home Management;Therapeutic exercise;Patient/family education    PT Next Visit Plan reassess 3-4 weeks post op to determine needs    PT Home Exercise Plan post op breast exercises    Consulted and Agree with Plan of Care Patient           Patient will benefit from skilled therapeutic intervention in order to improve the following deficits and impairments:  Decreased knowledge of precautions, Postural dysfunction  Visit Diagnosis: Abnormal posture  Malignant neoplasm of  right breast in female, estrogen receptor positive, unspecified site of breast [facility])   Patient will follow up at outpatient cancer rehab 3-4 weeks following surgery.  If the patient requires physical therapy at that time, a specific plan will be dictated and sent to the referring physician for approval. The patient was educated today on appropriate basic range of motion exercises to begin post operatively and the importance of attending the After Breast Cancer class following surgery.  Patient was educated today on lymphedema risk reduction practices as it pertains to recommendations that will benefit the patient immediately following surgery.  She verbalized good understanding.      Problem List Patient Active Problem List   Diagnosis Date Noted   Malignant neoplasm of upper-outer quadrant of right breast in female, estrogen receptor  positive ([place]) 08/03/2019   Plantar fasciitis, bilateral 02/15/2015   Lumbago 02/15/2015   Screening 02/15/2015   Hemisensory loss 06/11/2013   Weakness 06/11/2013    Allyson Sabal [facility] 01/18/2020, 1:47 PM  [place], [place], [address] Phone: [phone]   Fax:  [phone]  Name: Polly Barner MRN: [id number] Date of Birth: 01/17/1956  Manus Gunning, PT 01/18/20 1:48 PM

## 2020-01-25 ENCOUNTER — Telehealth: Payer: Self-pay | Admitting: *Deleted

## 2020-01-25 NOTE — Telephone Encounter (Signed)
Called and lvm for a call back to schedule appointment with Dr. Isidore Moos.

## 2020-02-10 ENCOUNTER — Other Ambulatory Visit: Payer: Self-pay

## 2020-02-10 ENCOUNTER — Encounter (HOSPITAL_COMMUNITY)
Admission: RE | Admit: 2020-02-10 | Discharge: 2020-02-10 | Disposition: A | Discharge status: Home / Self Care | Payer: Managed Care, Other (non HMO) | Source: Ambulatory Visit | Attending: Surgery | Admitting: Surgery

## 2020-02-10 ENCOUNTER — Encounter (HOSPITAL_COMMUNITY): Payer: Self-pay

## 2020-02-10 DIAGNOSIS — Z01812 Encounter for preprocedural laboratory examination: Secondary | ICD-10-CM | POA: Diagnosis present

## 2020-02-10 LAB — COMPREHENSIVE METABOLIC PANEL
ALT: 36 U/L (ref 0–44)
AST: 27 U/L (ref 15–41)
Albumin: 3.9 g/dL (ref 3.5–5.0)
Alkaline Phosphatase: 62 U/L (ref 38–126)
Anion gap: 8 (ref 5–15)
BUN: 14 mg/dL (ref 8–23)
CO2: 22 mmol/L (ref 22–32)
Calcium: 9.1 mg/dL (ref 8.9–10.3)
Chloride: 109 mmol/L (ref 98–111)
Creatinine, Ser: 0.71 mg/dL (ref 0.44–1.00)
GFR calc Af Amer: >60 mL/min (ref >60)
GFR calc non Af Amer: >60 mL/min (ref >60)
Glucose, Bld: 97 mg/dL (ref 70–99)
Potassium: 3.8 mmol/L (ref 3.5–5.1)
Sodium: 139 mmol/L (ref 135–145)
Total Bilirubin: 0.6 mg/dL (ref 0.3–1.2)
Total Protein: 6.1 g/dL — ABNORMAL LOW (ref 6.5–8.1)

## 2020-02-10 LAB — CBC WITH DIFFERENTIAL/PLATELET
Abs Immature Granulocytes: 0.03 10*3/uL (ref 0.00–0.07)
Basophils Absolute: 0.1 10*3/uL (ref 0.0–0.1)
Basophils Relative: 1 %
Eosinophils Absolute: 0.2 10*3/uL (ref 0.0–0.5)
Eosinophils Relative: 2 %
HCT: 37.4 % (ref 36.0–46.0)
Hemoglobin: 12.4 g/dL (ref 12.0–15.0)
Immature Granulocytes: 0 %
Lymphocytes Relative: 22 %
Lymphs Abs: 1.6 10*3/uL (ref 0.7–4.0)
MCH: 29.5 pg (ref 26.0–34.0)
MCHC: 33.2 g/dL (ref 30.0–36.0)
MCV: 89 fL (ref 80.0–100.0)
Monocytes Absolute: 0.5 10*3/uL (ref 0.1–1.0)
Monocytes Relative: 7 %
Neutro Abs: 5 10*3/uL (ref 1.7–7.7)
Neutrophils Relative %: 68 %
Platelets: 169 10*3/uL (ref 150–400)
RBC: 4.2 MIL/uL (ref 3.87–5.11)
RDW: 14 % (ref 11.5–15.5)
WBC: 7.4 10*3/uL (ref 4.0–10.5)
nRBC: 0 % (ref 0.0–0.2)

## 2020-02-10 MED ORDER — CHLORHEXIDINE GLUCONATE CLOTH 2 % EX PADS: 6.0000 | MEDICATED_PAD | Freq: Once | CUTANEOUS | Status: DC

## 2020-02-10 NOTE — Progress Notes (Signed)
PCP - [place]  NP Cardiologist - NA   s -   -   Chest x-ray - NA EKG -NA  Stress Test - NA ECHO - 1/21 Cardiac Cath -  NA  -  RAS Protcol -YES PRE-SURGERY Ensure or G2- DRINK GIVEN  COVID TEST- FOR MON   Anesthesia review: NA  Patient denies shortness of breath, fever, cough and chest pain at PAT appointment   All instructions explained to the patient, with a verbal understanding of the material. Patient agrees to go over the instructions while at home for a better understanding. Patient also instructed to self quarantine after being tested for COVID-19. The opportunity to ask questions was provided.

## 2020-02-10 NOTE — Pre-Procedure Instructions (Signed)
Paige Gilbert [facility]  02/10/2020      [id number] - [place], [place] - [address] [place] [address] Phone: [phone] Fax: [phone]  [place], [place] - [place] [address] Phone: [phone] Fax: [phone]    Your procedure is scheduled on   02/17/20.  Report to [facility] Admitting at 8 A.M.  Call this number if you have problems the morning of surgery:  [phone]   Remember:  Do not eat or drink after midnight.  You may drink clear liquids until 7AM .  Clear liquids allowed are:                    Water, Juice (non-citric and without pulp - diabetics please choose diet or no sugar options), Carbonated beverages - (diabetics please choose diet or no sugar options), Clear Tea and Black Coffee only (no creamer, milk or cream including half and half)    Take these medicines the morning of surgery with A SIP OF WATER ----NONE    Do not wear jewelry, make-up or nail polish.  Do not wear lotions, powders, or perfumes, or deodorant.  Do not shave 48 hours prior to surgery.  Men may shave face and neck.  Do not bring valuables to the hospital.  [facility] is not responsible for any belongings or valuables.  Contacts, dentures or bridgework may not be worn into surgery.  Leave your suitcase in the car.  After surgery it may be brought to your room.  For patients admitted to the hospital, discharge time will be determined by your treatment team.  Patients discharged the day of surgery will not be allowed to drive home.    Do not take any aspirin,anti-inflammatories,vitamins,or herbal supplements 5-7 days prior to surgery.[facility] - Preparing for Surgery  Before surgery, you can play an important role.  Because skin is not sterile, your skin needs to be as free of germs as possible.  You can reduce the number of germs on you skin by  washing with CHG (chlorahexidine gluconate) soap before surgery.  CHG is an antiseptic cleaner which kills germs and bonds with the skin to continue killing germs even after washing.  Oral Hygiene is also important in reducing the risk of infection.  Remember to brush your teeth with your regular toothpaste the morning of surgery.  Please DO NOT use if you have an allergy to CHG or antibacterial soaps.  If your skin becomes reddened/irritated stop using the CHG and inform your nurse when you arrive at Short Stay.  Do not shave (including legs and underarms) for at least 48 hours prior to the first CHG shower.  You may shave your face.  Please follow these instructions carefully:   1.  Shower with CHG Soap the night before surgery and the morning of Surgery.  2.  If you choose to wash your hair, wash your hair first as usual with your normal shampoo.  3.  After you shampoo, rinse your hair and body thoroughly to remove the shampoo. 4.  Use CHG as you would any other liquid soap.  You can apply chg directly to the skin and wash gently with a      scrungie or washcloth.           5.  Apply the CHG Soap to your body ONLY FROM THE  NECK DOWN.   Do not use on open wounds or open sores. Avoid contact with your eyes, ears, mouth and genitals (private parts).  Wash genitals (private parts) with your normal soap.  6.  Wash thoroughly, paying special attention to the area where your surgery will be performed.  7.  Thoroughly rinse your body with warm water from the neck down.  8.  DO NOT shower/wash with your normal soap after using and rinsing off the CHG Soap.  9.  Pat yourself dry with a clean towel.            10.  Wear clean pajamas.            11.  Place clean sheets on your bed the night of your first shower and do not sleep with pets.  Day of Surgery  Do not apply any lotions/deoderants the morning of surgery.   Please wear clean clothes to the hospital/surgery center. Remember to brush your  teeth with toothpaste.  Please complete your PRE-SURGERY ENSURE that was provided to you by ... the morning of surgery.  Please, if able, drink it in one setting. DO NOT SIP. NPlease read over the following fact sheets that you were given. Coughing and Deep Breathing

## 2020-02-14 ENCOUNTER — Other Ambulatory Visit (HOSPITAL_COMMUNITY)
Admission: RE | Admit: 2020-02-14 | Discharge: 2020-02-14 | Disposition: A | Discharge status: Home / Self Care | Payer: Managed Care, Other (non HMO) | Source: Ambulatory Visit | Attending: Surgery | Admitting: Surgery

## 2020-02-14 DIAGNOSIS — Z01812 Encounter for preprocedural laboratory examination: Secondary | ICD-10-CM | POA: Insufficient documentation

## 2020-02-14 DIAGNOSIS — Z20822 Contact with and (suspected) exposure to covid-19: Secondary | ICD-10-CM | POA: Insufficient documentation

## 2020-02-14 LAB — SARS CORONAVIRUS 2 (TAT 6-24 HRS): SARS Coronavirus 2: NEGATIVE

## 2020-02-17 ENCOUNTER — Other Ambulatory Visit: Payer: Self-pay

## 2020-02-17 ENCOUNTER — Ambulatory Visit (HOSPITAL_COMMUNITY): Payer: Managed Care, Other (non HMO) | Admitting: Certified Registered Nurse Anesthetist

## 2020-02-17 ENCOUNTER — Observation Stay (HOSPITAL_COMMUNITY)
Admission: RE | Admit: 2020-02-17 | Discharge: 2020-02-18 | Disposition: A | Discharge status: Home / Self Care | Payer: Managed Care, Other (non HMO) | Attending: Surgery | Admitting: Surgery

## 2020-02-17 ENCOUNTER — Encounter (HOSPITAL_COMMUNITY)
Admission: RE | Disposition: A | Discharge status: Home / Self Care | Payer: Self-pay | Source: Home / Self Care | Attending: Surgery

## 2020-02-17 ENCOUNTER — Encounter (HOSPITAL_COMMUNITY): Payer: Self-pay | Admitting: Surgery

## 2020-02-17 DIAGNOSIS — Z5111 Encounter for antineoplastic chemotherapy: Secondary | ICD-10-CM | POA: Diagnosis not present

## 2020-02-17 DIAGNOSIS — C50911 Malignant neoplasm of unspecified site of right female breast: Secondary | ICD-10-CM | POA: Diagnosis not present

## 2020-02-17 HISTORY — PX: MASTECTOMY MODIFIED RADICAL: SHX5962

## 2020-02-17 HISTORY — PX: PORT-A-CATH REMOVAL: SHX5289

## 2020-02-17 SURGERY — MASTECTOMY, MODIFIED RADICAL
Anesthesia: General | Site: Chest | Laterality: Right

## 2020-02-17 MED ORDER — MIDAZOLAM HCL 2 MG/2ML IJ SOLN
INTRAMUSCULAR | Status: AC
  Administered 2020-02-17: 2 mg via INTRAVENOUS
  Filled 2020-02-17: qty 2

## 2020-02-17 MED ORDER — PROPOFOL 10 MG/ML IV BOLUS
INTRAVENOUS | Status: DC | PRN
  Administered 2020-02-17: 120 mg via INTRAVENOUS

## 2020-02-17 MED ORDER — MIDAZOLAM HCL 2 MG/2ML IJ SOLN: 2.0000 mg | Freq: Once | INTRAMUSCULAR | Status: AC

## 2020-02-17 MED ORDER — 0.9 % SODIUM CHLORIDE (POUR BTL) OPTIME
TOPICAL | Status: DC | PRN
  Administered 2020-02-17: 1000 mL

## 2020-02-17 MED ORDER — PROPOFOL 10 MG/ML IV BOLUS
INTRAVENOUS | Status: AC
  Filled 2020-02-17: qty 40

## 2020-02-17 MED ORDER — ENOXAPARIN SODIUM 40 MG/0.4ML ~~LOC~~ SOLN: 40.0000 mg | SUBCUTANEOUS | Status: DC

## 2020-02-17 MED ORDER — SUCCINYLCHOLINE CHLORIDE 200 MG/10ML IV SOSY
PREFILLED_SYRINGE | INTRAVENOUS | Status: AC
  Filled 2020-02-17: qty 10

## 2020-02-17 MED ORDER — LACTATED RINGERS IV SOLN: INTRAVENOUS | Status: DC | PRN

## 2020-02-17 MED ORDER — IBUPROFEN 600 MG PO TABS: 600.0000 mg | ORAL_TABLET | Freq: Four times a day (QID) | ORAL | Status: DC | PRN

## 2020-02-17 MED ORDER — GABAPENTIN 300 MG PO CAPS
300.0000 mg | ORAL_CAPSULE | ORAL | Status: AC
  Administered 2020-02-17: 300 mg via ORAL
  Filled 2020-02-17: qty 1

## 2020-02-17 MED ORDER — SUGAMMADEX SODIUM 500 MG/5ML IV SOLN
INTRAVENOUS | Status: AC
  Filled 2020-02-17: qty 5

## 2020-02-17 MED ORDER — ONDANSETRON HCL 4 MG/2ML IJ SOLN
INTRAMUSCULAR | Status: DC | PRN
  Administered 2020-02-17: 4 mg via INTRAVENOUS

## 2020-02-17 MED ORDER — BUPIVACAINE HCL (PF) 0.25 % IJ SOLN
INTRAMUSCULAR | Status: AC
  Filled 2020-02-17: qty 30

## 2020-02-17 MED ORDER — FENTANYL CITRATE (PF) 250 MCG/5ML IJ SOLN
INTRAMUSCULAR | Status: AC
  Filled 2020-02-17: qty 5

## 2020-02-17 MED ORDER — ONDANSETRON HCL 4 MG/2ML IJ SOLN: 4.0000 mg | Freq: Four times a day (QID) | INTRAMUSCULAR | Status: DC | PRN

## 2020-02-17 MED ORDER — ACETAMINOPHEN 500 MG PO TABS
1000.0000 mg | ORAL_TABLET | ORAL | Status: AC
  Administered 2020-02-17: 1000 mg via ORAL
  Filled 2020-02-17: qty 2

## 2020-02-17 MED ORDER — DEXTROSE-NACL 5-0.9 % IV SOLN: INTRAVENOUS | Status: DC

## 2020-02-17 MED ORDER — FENTANYL CITRATE (PF) 100 MCG/2ML IJ SOLN
INTRAMUSCULAR | Status: AC
  Administered 2020-02-17: 100 ug via INTRAVENOUS
  Filled 2020-02-17: qty 2

## 2020-02-17 MED ORDER — CELECOXIB 200 MG PO CAPS
200.0000 mg | ORAL_CAPSULE | Freq: Two times a day (BID) | ORAL | Status: DC
  Administered 2020-02-17 – 2020-02-18 (×3): 200 mg via ORAL
  Filled 2020-02-17 (×4): qty 1

## 2020-02-17 MED ORDER — OXYCODONE HCL 5 MG PO TABS: 5.0000 mg | ORAL_TABLET | Freq: Once | ORAL | Status: DC | PRN

## 2020-02-17 MED ORDER — SCOPOLAMINE 1 MG/3DAYS TD PT72
1.0000 | MEDICATED_PATCH | TRANSDERMAL | Status: DC
  Administered 2020-02-17: 1.5 mg via TRANSDERMAL
  Filled 2020-02-17: qty 1

## 2020-02-17 MED ORDER — OXYCODONE HCL 5 MG PO TABS: 5.0000 mg | ORAL_TABLET | ORAL | Status: DC | PRN

## 2020-02-17 MED ORDER — HYDROMORPHONE HCL 1 MG/ML IJ SOLN: 1.0000 mg | INTRAMUSCULAR | Status: DC | PRN

## 2020-02-17 MED ORDER — CHLORHEXIDINE GLUCONATE 0.12 % MT SOLN
OROMUCOSAL | Status: AC
  Administered 2020-02-17: 15 mL
  Filled 2020-02-17: qty 15

## 2020-02-17 MED ORDER — ONDANSETRON HCL 4 MG/2ML IJ SOLN
INTRAMUSCULAR | Status: AC
  Filled 2020-02-17: qty 2

## 2020-02-17 MED ORDER — DEXAMETHASONE SODIUM PHOSPHATE 10 MG/ML IJ SOLN
INTRAMUSCULAR | Status: DC | PRN
  Administered 2020-02-17: 10 mg via INTRAVENOUS

## 2020-02-17 MED ORDER — PROMETHAZINE HCL 25 MG/ML IJ SOLN: 6.2500 mg | INTRAMUSCULAR | Status: DC | PRN

## 2020-02-17 MED ORDER — OXYCODONE HCL 5 MG/5ML PO SOLN: 5.0000 mg | Freq: Once | ORAL | Status: DC | PRN

## 2020-02-17 MED ORDER — DEXAMETHASONE SODIUM PHOSPHATE 10 MG/ML IJ SOLN
INTRAMUSCULAR | Status: AC
  Filled 2020-02-17: qty 1

## 2020-02-17 MED ORDER — FENTANYL CITRATE (PF) 250 MCG/5ML IJ SOLN
INTRAMUSCULAR | Status: DC | PRN
  Administered 2020-02-17 (×2): 25 ug via INTRAVENOUS

## 2020-02-17 MED ORDER — METOPROLOL TARTRATE 5 MG/5ML IV SOLN: 5.0000 mg | Freq: Four times a day (QID) | INTRAVENOUS | Status: DC | PRN

## 2020-02-17 MED ORDER — LIDOCAINE 2% (20 MG/ML) 5 ML SYRINGE
INTRAMUSCULAR | Status: DC | PRN
  Administered 2020-02-17: 20 mg via INTRAVENOUS

## 2020-02-17 MED ORDER — PHENYLEPHRINE HCL-NACL 10-0.9 MG/250ML-% IV SOLN
INTRAVENOUS | Status: DC | PRN
  Administered 2020-02-17: 40 ug/min via INTRAVENOUS

## 2020-02-17 MED ORDER — FENTANYL CITRATE (PF) 100 MCG/2ML IJ SOLN: 100.0000 ug | Freq: Once | INTRAMUSCULAR | Status: AC

## 2020-02-17 MED ORDER — MIDAZOLAM HCL 2 MG/2ML IJ SOLN: 0.5000 mg | Freq: Once | INTRAMUSCULAR | Status: DC | PRN

## 2020-02-17 MED ORDER — BUPIVACAINE-EPINEPHRINE (PF) 0.5% -1:200000 IJ SOLN
INTRAMUSCULAR | Status: DC | PRN
  Administered 2020-02-17: 30 mL

## 2020-02-17 MED ORDER — ONDANSETRON 4 MG PO TBDP: 4.0000 mg | ORAL_TABLET | Freq: Four times a day (QID) | ORAL | Status: DC | PRN

## 2020-02-17 MED ORDER — ACETAMINOPHEN 500 MG PO TABS: 1000.0000 mg | ORAL_TABLET | Freq: Once | ORAL | Status: DC

## 2020-02-17 MED ORDER — CEFAZOLIN SODIUM-DEXTROSE 2-4 GM/100ML-% IV SOLN
2.0000 g | INTRAVENOUS | Status: AC
  Administered 2020-02-17: 2 g via INTRAVENOUS
  Filled 2020-02-17: qty 100

## 2020-02-17 MED ORDER — ROCURONIUM BROMIDE 10 MG/ML (PF) SYRINGE
PREFILLED_SYRINGE | INTRAVENOUS | Status: AC
  Filled 2020-02-17: qty 10

## 2020-02-17 MED ORDER — MIDAZOLAM HCL 2 MG/2ML IJ SOLN
INTRAMUSCULAR | Status: AC
  Filled 2020-02-17: qty 2

## 2020-02-17 MED ORDER — TRAMADOL HCL 50 MG PO TABS: 50.0000 mg | ORAL_TABLET | Freq: Four times a day (QID) | ORAL | Status: DC | PRN

## 2020-02-17 MED ORDER — HYDROMORPHONE HCL 1 MG/ML IJ SOLN: 0.2500 mg | INTRAMUSCULAR | Status: DC | PRN

## 2020-02-17 MED ORDER — GABAPENTIN 300 MG PO CAPS
300.0000 mg | ORAL_CAPSULE | Freq: Two times a day (BID) | ORAL | Status: DC
  Administered 2020-02-17 – 2020-02-18 (×3): 300 mg via ORAL
  Filled 2020-02-17 (×3): qty 1

## 2020-02-17 MED ORDER — MEPERIDINE HCL 25 MG/ML IJ SOLN: 6.2500 mg | INTRAMUSCULAR | Status: DC | PRN

## 2020-02-17 MED ORDER — LIDOCAINE 2% (20 MG/ML) 5 ML SYRINGE
INTRAMUSCULAR | Status: AC
  Filled 2020-02-17: qty 5

## 2020-02-17 SURGICAL SUPPLY — 55 items
ADH SKN CLS APL DERMABOND .7 (GAUZE/BANDAGES/DRESSINGS) ×2
APL PRP STRL LF DISP 70% ISPRP (MISCELLANEOUS) ×2
APPLIER CLIP 9.375 MED OPEN (MISCELLANEOUS) ×12
APR CLP MED 9.3 20 MLT OPN (MISCELLANEOUS) ×6
BINDER BREAST LRG (GAUZE/BANDAGES/DRESSINGS) IMPLANT
BINDER BREAST XLRG (GAUZE/BANDAGES/DRESSINGS) IMPLANT
BIOPATCH RED 1 DISK 7.0 (GAUZE/BANDAGES/DRESSINGS) ×1 IMPLANT
BIOPATCH RED 1IN DISK 7.0MM (GAUZE/BANDAGES/DRESSINGS) ×1
CANISTER SUCT 3000ML PPV (MISCELLANEOUS) ×4 IMPLANT
CHLORAPREP W/TINT 10.5 ML (MISCELLANEOUS) ×2 IMPLANT
CHLORAPREP W/TINT 26 (MISCELLANEOUS) ×4 IMPLANT
CLIP APPLIE 9.375 MED OPEN (MISCELLANEOUS) ×2 IMPLANT
COVER SURGICAL LIGHT HANDLE (MISCELLANEOUS) ×4 IMPLANT
COVER WAND RF STERILE (DRAPES) ×4 IMPLANT
DECANTER SPIKE VIAL GLASS SM (MISCELLANEOUS) ×2 IMPLANT
DERMABOND ADVANCED (GAUZE/BANDAGES/DRESSINGS) ×2
DERMABOND ADVANCED .7 DNX12 (GAUZE/BANDAGES/DRESSINGS) ×2 IMPLANT
DRAIN CHANNEL 19F RND (DRAIN) ×6 IMPLANT
DRAPE LAPAROSCOPIC ABDOMINAL (DRAPES) ×4 IMPLANT
DRAPE LAPAROTOMY 100X72 PEDS (DRAPES) ×4 IMPLANT
DRSG PAD ABDOMINAL 8X10 ST (GAUZE/BANDAGES/DRESSINGS) ×2 IMPLANT
DRSG TEGADERM 4X4.75 (GAUZE/BANDAGES/DRESSINGS) ×2 IMPLANT
ELECT CAUTERY BLADE 6.4 (BLADE) ×2 IMPLANT
ELECT REM PT RETURN 9FT ADLT (ELECTROSURGICAL) ×4
ELECTRODE REM PT RTRN 9FT ADLT (ELECTROSURGICAL) ×2 IMPLANT
EVACUATOR SILICONE 100CC (DRAIN) ×6 IMPLANT
GAUZE 4X4 16PLY RFD (DISPOSABLE) ×2 IMPLANT
GAUZE SPONGE 4X4 12PLY STRL (GAUZE/BANDAGES/DRESSINGS) ×4 IMPLANT
GLOVE BIO SURGEON STRL SZ8 (GLOVE) ×4 IMPLANT
GLOVE BIOGEL PI IND STRL 8 (GLOVE) ×2 IMPLANT
GLOVE BIOGEL PI INDICATOR 8 (GLOVE) ×2
GOWN STRL REUS W/ TWL LRG LVL3 (GOWN DISPOSABLE) ×6 IMPLANT
GOWN STRL REUS W/ TWL XL LVL3 (GOWN DISPOSABLE) ×2 IMPLANT
GOWN STRL REUS W/TWL LRG LVL3 (GOWN DISPOSABLE) ×12
GOWN STRL REUS W/TWL XL LVL3 (GOWN DISPOSABLE) ×4
KIT BASIN OR (CUSTOM PROCEDURE TRAY) ×4 IMPLANT
KIT TURNOVER KIT B (KITS) ×4 IMPLANT
NDL HYPO 25GX1X1/2 BEV (NEEDLE) ×2 IMPLANT
NEEDLE HYPO 25GX1X1/2 BEV (NEEDLE) ×4 IMPLANT
NS IRRIG 1000ML POUR BTL (IV SOLUTION) ×4 IMPLANT
PACK GENERAL/GYN (CUSTOM PROCEDURE TRAY) ×4 IMPLANT
PAD ARMBOARD 7.5X6 YLW CONV (MISCELLANEOUS) ×8 IMPLANT
PENCIL SMOKE EVACUATOR (MISCELLANEOUS) ×4 IMPLANT
SPECIMEN JAR X LARGE (MISCELLANEOUS) ×4 IMPLANT
SPONGE LAP 18X18 RF (DISPOSABLE) ×2 IMPLANT
SUT ETHILON 2 0 FS 18 (SUTURE) ×2 IMPLANT
SUT ETHILON 3 0 FSL (SUTURE) ×4 IMPLANT
SUT MNCRL AB 4-0 PS2 18 (SUTURE) ×6 IMPLANT
SUT VIC AB 3-0 SH 18 (SUTURE) ×2 IMPLANT
SUT VIC AB 3-0 SH 27 (SUTURE) ×8
SUT VIC AB 3-0 SH 27X BRD (SUTURE) ×2 IMPLANT
SYR BULB IRRIG 60ML STRL (SYRINGE) ×2 IMPLANT
SYR CONTROL 10ML LL (SYRINGE) ×4 IMPLANT
TOWEL GREEN STERILE (TOWEL DISPOSABLE) ×4 IMPLANT
TOWEL GREEN STERILE FF (TOWEL DISPOSABLE) ×4 IMPLANT

## 2020-02-17 NOTE — Anesthesia Postprocedure Evaluation (Signed)
Anesthesia Post Note  Patient: Paige Gilbert  Procedure(s) Performed: RIGHT MASTECTOMY MODIFIED RADICAL (Right Breast) REMOVAL PORT-A-CATH (Right Chest)     Patient location during evaluation: PACU Anesthesia Type: General Level of consciousness: awake and alert, patient cooperative and oriented Pain management: pain level controlled Vital Signs Assessment: post-procedure vital signs reviewed and stable Respiratory status: spontaneous breathing, nonlabored ventilation and respiratory function stable Cardiovascular status: blood pressure returned to baseline and stable Postop Assessment: no apparent nausea or vomiting Anesthetic complications: no   No complications documented.  Last Vitals:  Vitals:   02/17/20 1322 02/17/20 1352  BP: 127/69 127/69  Pulse: 56 70  Resp: 14 15  Temp:  36.6 C  SpO2: 100% 100%    Last Pain:  Vitals:   02/17/20 1417  TempSrc:   PainSc: 3                  Shawnita Krizek,E. Kojo Liby

## 2020-02-17 NOTE — Transfer of Care (Signed)
Immediate Anesthesia Transfer of Care Note  Patient: Paige Gilbert  Procedure(s) Performed: RIGHT MASTECTOMY MODIFIED RADICAL (Right Breast) REMOVAL PORT-A-CATH (Right Chest)  Patient Location: PACU  Anesthesia Type:General  Level of Consciousness: drowsy  Airway & Oxygen Therapy: Patient Spontanous Breathing and Patient connected to face mask oxygen  Post-op Assessment: Report given to RN, Post -op Vital signs reviewed and stable and Patient moving all extremities X 4  Post vital signs: Reviewed and stable  Last Vitals:  Vitals Value Taken Time  BP 172/100 02/17/20 1154  Temp    Pulse 72 02/17/20 1155  Resp 11 02/17/20 1155  SpO2 100 % 02/17/20 1155  Vitals shown include unvalidated device data.  Last Pain:  Vitals:   02/17/20 0933  TempSrc:   PainSc: 0-No pain      Patients Stated Pain Goal: 3 (82/51/89 8421)  Complications: No complications documented.

## 2020-02-17 NOTE — Anesthesia Procedure Notes (Signed)
Anesthesia Regional Block: Pectoralis block   Pre-Anesthetic Checklist: ,, timeout performed, Correct Patient, Correct Site, Correct Laterality, Correct Procedure, Correct Position, site marked, Risks and benefits discussed,  Surgical consent,  Pre-op evaluation,  At surgeon's request and post-op pain management  Laterality: Right  Prep: chloraprep       Needles:  Injection technique: Single-shot     Needle Length: 9cm  Needle Gauge: 21     Additional Needles:   Procedures:,,,, ultrasound used (permanent image in chart),,,,  Narrative:  Start time: 02/17/2020 9:41 AM End time: 02/17/2020 9:47 AM Injection made incrementally with aspirations every 5 mL.  Performed by: Personally  Anesthesiologist: Annye Asa, MD  Additional Notes: Pt identified in Holding room.  Monitors applied. Working IV access confirmed. Sterile prep, drape R clavicle and pec.  #21ga ECHOgenic arrow needle between pec major and serratus, between ribs 4,5 with US guidance.  30cc 0.5% Bupivacaine with 1:200k epi injected incrementally after negative test dose.  Patient asymptomatic, VSS, no heme aspirated, tolerated well.  Jenita Seashore, MD

## 2020-02-17 NOTE — Anesthesia Procedure Notes (Signed)
Procedure Name: LMA Insertion Date/Time: 02/17/2020 10:18 AM Performed by: Valda Favia, CRNA Pre-anesthesia Checklist: Patient identified, Emergency Drugs available, Suction available and Patient being monitored Patient Re-evaluated:Patient Re-evaluated prior to induction Oxygen Delivery Method: Circle System Utilized Preoxygenation: Pre-oxygenation with 100% oxygen Induction Type: IV induction LMA: LMA inserted LMA Size: 4.0 Number of attempts: 1 Airway Equipment and Method: Bite block Placement Confirmation: positive ETCO2 Tube secured with: Tape Dental Injury: Teeth and Oropharynx as per pre-operative assessment

## 2020-02-17 NOTE — Anesthesia Preprocedure Evaluation (Addendum)
Anesthesia Evaluation  Patient identified by MRN, date of birth, ID band Patient awake    Reviewed: Allergy & Precautions, NPO status , Patient's Chart, lab work & pertinent test results  History of Anesthesia Complications Negative for: history of anesthetic complications  Airway Mallampati: II  TM Distance: >3 FB Neck ROM: Full    Dental  (+) Dental Advisory Given, Teeth Intact   Pulmonary neg pulmonary ROS,  02/14/2020 SARS coronavirus NEG   breath sounds clear to auscultation       Cardiovascular negative cardio ROS   Rhythm:Regular Rate:Normal  07/2019 ECHO: EF 60-65%, valves OK   Neuro/Psych negative neurological ROS     GI/Hepatic negative GI ROS, Neg liver ROS,   Endo/Other  negative endocrine ROS  Renal/GU negative Renal ROS     Musculoskeletal   Abdominal   Peds  Hematology negative hematology ROS (+)   Anesthesia Other Findings Breast ca  Reproductive/Obstetrics                           Anesthesia Physical Anesthesia Plan  ASA: II  Anesthesia Plan: General   Post-op Pain Management: GA combined w/ Regional for post-op pain   Induction: Intravenous  PONV Risk Score and Plan: 3 and Ondansetron, Dexamethasone and Scopolamine patch - Pre-op  Airway Management Planned: LMA  Additional Equipment: None  Intra-op Plan:   Post-operative Plan:   Informed Consent: I have reviewed the patients History and Physical, chart, labs and discussed the procedure including the risks, benefits and alternatives for the proposed anesthesia with the patient or authorized representative who has indicated his/her understanding and acceptance.     Dental advisory given  Plan Discussed with: CRNA and Surgeon  Anesthesia Plan Comments:        Anesthesia Quick Evaluation

## 2020-02-17 NOTE — Op Note (Signed)
Right modified Radical Mastectomy and port removal procedure Note  Indications: This patient presents for surgical treatment of Breast Cancer.  She has completed chemotherapy for locally advanced right breast cancer.  She presents today for right modified radical mastectomy and port removal.The surgical and non surgical options have been discussed with the patient.  Risks of surgery include bleeding,  Infection,  Flap necrosis,  Tissue loss,  Chronic pain, death, Numbness,  And the need for additional procedures.  Reconstruction options also have been discussed with the patient as well.  The patient agrees to proceed.The procedure has been discussed with the patient.  Alternative therapies have been discussed with the patient.  Operative risks include bleeding,  Infection,  Organ injury,  Nerve injury,  Blood vessel injury,  DVT,  Pulmonary embolism,  Death,  And possible reoperation.  Medical management risks include worsening of present situation.  The success of the procedure is 50 -90 % at treating patients symptoms.  The patient understands and agrees to proceed.  Pre-operative Diagnosis: right breast cancer  Post-operative Diagnosis: right breast cancer  Surgeon: Turner Daniels MD  Assistants: NONE  Anesthesia: General endotracheal anesthesia and pectoral block ASA Class: 2  Procedure Details  The patient was seen again in the Holding Room. The risks, benefits, indications, potential complications, treatment options, and expected outcomes were discussed with the patient. The possibilities of reaction to medication, pulmonary aspiration, bleeding, recurrent infection, the need for additional procedures, failure to diagnose a condition, and creating a complication requiring transfusion or further operation were discussed with the patient. The patient and/or family concurred with the proposed plan, giving informed consent. The site of surgery was properly noted/marked. The patient was taken to  the Operating Room, identified as Paige Gilbert and the procedure verified as right modified radical mastectomy. A Time Out was held and the above information confirmed.  The patient was placed supine and general anesthetic was administered. The right arm, right breast, and chest were prepped and draped in standard fashion. An oblique elliptical incision was made encompassing the nipple. Skin flaps were created meticulously to preserve the subdermal blood supply and maintain a clear margin of resection around the tumor. Dissection was carried down to the pectoralis fascia, which was included with the specimen, and an axillary dissection was performed in continuity with the mastectomy. This included levels I and II. This was accomplished by exposing the axillary vein anteriorly and inferiorly to the level of the pectoralis minor and laterally. Small venous tributaries, lymphatics, and vessels were clipped and ligated or cauterized and divided. The subscapularis muscle was skeletonized. The long thoracic and thoracodorsal neurovascular bundles were identified and preserved.  Port was removed through the upper skin flap.  This was done without difficulty and moved intact.  The pocket was closed with 3-0 Vicryl.    The specimen was submitted to pathology. The wound was irrigated.  1 J-P drains were placed. One was placed within the lower flap. The skin incision was closed in layers with a 3-0 Vicryl and 4-0 Monocryl subcuticular closure. The drain WAS  secured with 2-0 nylon  suture.  Dermabond was  applied along with a dry bulky gauze dressing.  Instrument, sponge, and needle counts were correct at closure and at the conclusion of the case.   Findings: see above  Estimated Blood Loss: Minimal         Drains: one  JP drain placed as above         Total IV Fluids:  per anesthesia          Specimens: Breast with axillary contents   Complications: None; patient tolerated the procedure well.          Disposition: PACU - hemodynamically stable.         Condition: stable

## 2020-02-17 NOTE — H&P (Signed)
Paige Gilbert Appointment: Location: [facility] Patient #: [id number] DOB: 1955-11-30 Married / Language: English / Race: White Female  History of Present IllnessPM) Patient words: Patient returns after neoadjuvant chemotherapy for locally aggressive stage III right breast cancer. She is complaining chemotherapy this week. She's had a modest response with significant reduction of her right axillary adenopathy and reduction by about 20-25% after reviewing magnetic resonance imaging. She is tolerating chemotherapy well. She has no complaints today.        1. Right Breast, upper-outer quadrant, closer to the nipple - invasive ductal carcinoma with micropapillary features, grade 2 - ductal carcinoma in situ, grade 2-3, solid and cribriform pattern with comedonecrosis - Prognostic indicators significant for: estrogen receptor, 100% positive with strong staining intensity and progesterone receptor, 0% negative. HER2 equivocal by immunohistochemistry (2+), but negative by fluorescent in situ hybridization with a signals ratio 1.5 and number per cell 3.8.  2. Right Breast, upper-outer quadrant, axillary tail - invasive ductal carcinoma, grade 2 - ductal carcinoma in situ, grade 2-3 - Prognostic indicators significant for: estrogen receptor, 100% positive and progesterone receptor, 50% positive, both with strong staining intensity. HER2 equivocal by immunohistochemistry (2+), but negative by fluorescent in situ hybridization with a signals ratio 1.9 and number per cell 5.3.   ADDENDUM: The 0.5-0.6 cm enhancing foci/masses within the central inferior RIGHT breast are now difficult to visualize. Electronically Signed By: Margarette Canada M.D. On: 01/07/2020 10:40  Signed by Margarette Canada, MD on 01/07/2020 10:42 AM Narrative & Impression CLINICAL DATA: 64 year old female for evaluation of neoadjuvant  therapy of RIGHT breast cancer LABS: None performed today EXAM: BILATERAL BREAST MRI WITH AND WITHOUT CONTRAST TECHNIQUE: Multiplanar, multisequence MR images of both breasts were obtained prior to and following the intravenous administration of 6 ml of Gadavist Three-dimensional MR images were rendered by post-processing of the original MR data on an independent workstation. The three-dimensional MR images were interpreted, and findings are reported in the following complete MRI report for this study. Three dimensional images were evaluated at the independent DynaCad workstation COMPARISON: 11/01/2019 ultrasound, 08/15/2019 MR and prior studies FINDINGS: Breast composition: c. Heterogeneous fibroglandular tissue. Background parenchymal enhancement: Mild Right breast: Contiguous enhancement within the entire UPPER OUTER RIGHT breast has slightly decreased in size and fullness, now measuring approximately 6.5 x 3.2 x 6 cm (transverse x AP x CC), previously 7.6 x 3.9 x 5.9 cm on 08/15/2019. The 0.5-0.6 cm enhancing foci/masses within the central INFERIOR LEFT breast are now difficult to visualized. No new or increasing masses/enhancement noted. Left breast: No mass or abnormal enhancement. Lymph nodes: No abnormal appearing lymph nodes are noted. The previously enlarged RIGHT axillary lymph nodes are now all normal in size and appearance. Ancillary findings: None. IMPRESSION: 1. Slight interval decrease in size and fullness UPPER OUTER RIGHT breast malignancy, now measuring 6.5 x 3.2 x 6 cm, previously 7.6 x 3.9 x 5.9 cm on 08/15/2019. 2. The 0.5-0.6 cm enhancing foci/masses within the central INFERIOR LEFT breast are now difficult to visualize. 3. Interval resolution of RIGHT axillary lymph nodes. 4. No evidence of LEFT breast malignancy. RECOMMENDATION: Treatment plan BI-RADS CATEGORY 6: Known biopsy-proven malignancy. Electronically Signed: By: Margarette Canada M.D. On: 01/04/2020  15:07.  The patient is a 64 year old female.   Allergies  No Known Drug Allergies [08/09/2019]: Allergies Reconciled  Medication History No Current Medications Medications Reconciled    Vitals 01/10/2020 11:39 AM Weight: 137.13 lb Height: 64.5in Body Surface Area: 1.68 m Body Mass Index: 23.17  kg/m  Temp.: 97.37F  Pulse: 96 (Regular)  BP: 126/72(Sitting, Left Arm, Standard)        Physical Exam  General Mental Status-Alert. General Appearance-Consistent with stated age. Hydration-Well hydrated. Voice-Normal.  Breast Note: The right breast mass upper outer quadrant is about 7 cm in size and is not fixed to the underlying chest wall. The right nipple retraction is much improved. The right axillary adenopathy has resolved. Left breast is normal.  Neurologic Neurologic evaluation reveals -alert and oriented x 3 with no impairment of recent or remote memory. Mental Status-Normal.  Lymphatic Head & Neck  General Head & Neck Lymphatics: Bilateral - Description - Normal. Axillary  General Axillary Region: Bilateral - Description - Normal. Tenderness - Non Tender.    Assessment & Plan  BREAST CANCER, RIGHT (C50.911) Impression: Total time 45 minutes Discussed right modified radical mastectomy given previous findings with bulky adenopathy in the right axilla and large tumor burden right breast was significant skin involvement preoperatively. She has no interest in reconstruction. Also discuss port removal since she should be done with chemotherapy. Discussed treatment options for breast cancer to include breast conservation vs mastectomy with reconstruction. Pt has decided on mastectomy. Risk include bleeding, infection, flap necrosis, pain, numbness, recurrence, hematoma, other surgery needs. Pt understands and agrees to proceed.  Current Plans You are being scheduled for surgery- Our schedulers will call  you.  You should hear from our office's scheduling department within 5 working days about the location, date, and time of surgery. We try to make accommodations for patient's preferences in scheduling surgery, but sometimes the OR schedule or the surgeon's schedule prevents [place] from making those accommodations.  If you have not heard from our office [phone]) in 5 working days, call the office and ask for your surgeon's nurse.  If you have other questions about your diagnosis, plan, or surgery, call the office and ask for your surgeon's nurse.  Pt Education - [facility] Mastectomy HCI Pt Education - ABC (After Breast Cancer) Class Info: discussed with patient and provided information.

## 2020-02-17 NOTE — Discharge Instructions (Signed)
[facility] [place] °[phone] ° °MASTECTOMY: POST OP INSTRUCTIONS ° °Always review your discharge instruction sheet given to you by the facility where your surgery was performed. °IF YOU HAVE DISABILITY OR FAMILY LEAVE FORMS, YOU MUST BRING THEM TO THE OFFICE FOR PROCESSING.   °DO NOT GIVE THEM TO YOUR DOCTOR. °A prescription for pain medication may be given to you upon discharge.  Take your pain medication as prescribed, if needed.  If narcotic pain medicine is not needed, then you may take acetaminophen (Tylenol) or ibuprofen (Advil) as needed. °1. Take your usually prescribed medications unless otherwise directed. °2. If you need a refill on your pain medication, please contact your pharmacy.  They will contact our office to request authorization.  Prescriptions will not be filled after 5pm or on week-ends. °3. You should follow a light diet the first few days after arrival home, such as soup and crackers, etc.  Resume your normal diet the day after surgery. °4. Most patients will experience some swelling and bruising on the chest and underarm.  Ice packs will help.  Swelling and bruising can take several days to resolve.  °5. It is common to experience some constipation if taking pain medication after surgery.  Increasing fluid intake and taking a stool softener (such as Colace) will usually help or prevent this problem from occurring.  A mild laxative (Milk of Magnesia or Miralax) should be taken according to package instructions if there are no bowel movements after 48 hours. °6. Unless discharge instructions indicate otherwise, leave your bandage dry and in place until your next appointment in 3-5 days.  You may take a limited sponge bath.  No tube baths or showers until the drains are removed.  You may have steri-strips (small skin tapes) in place directly over the incision.  These strips should be left on the skin for 7-10 days.  If your surgeon used skin glue on the incision, you may  shower in 24 hours.  The glue will flake off over the next 2-3 weeks.  Any sutures or staples will be removed at the office during your follow-up visit. °7. DRAINS:  If you have drains in place, it is important to keep a list of the amount of drainage produced each day in your drains.  Before leaving the hospital, you should be instructed on drain care.  Call our office if you have any questions about your drains. °8. ACTIVITIES:  You may resume regular (light) daily activities beginning the next day--such as daily self-care, walking, climbing stairs--gradually increasing activities as tolerated.  You may have sexual intercourse when it is comfortable.  Refrain from any heavy lifting or straining until approved by your doctor. °a. You may drive when you are no longer taking prescription pain medication, you can comfortably wear a seatbelt, and you can safely maneuver your car and apply brakes. °b. RETURN TO WORK:  __________________________________________________________ °9. You should see your doctor in the office for a follow-up appointment approximately 3-5 days after your surgery.  Your doctor’s nurse will typically make your follow-up appointment when she calls you with your pathology report.  Expect your pathology report 2-3 business days after your surgery.  You may call to check if you do not hear from us after three days.   °10. OTHER INSTRUCTIONS: ______________________________________________________________________________________________ ____________________________________________________________________________________________ °WHEN TO CALL YOUR DOCTOR: °1. Fever over 101.0 °2. Nausea and/or vomiting °3. Extreme swelling or bruising °4. Continued bleeding from incision. °5. Increased pain, redness, or drainage from the incision. °  The clinic staff is available to answer your questions during regular business hours.  Please don’t hesitate to call and ask to speak to one of the nurses for clinical  concerns.  If you have a medical emergency, go to the nearest emergency room or call 911.  A surgeon from [facility] is always on call at the hospital. °[address], [address], [place], [place]  [address] ? [address] [address], [place], [place]   [address] °[phone] ? [phone] ? FAX [phone] °Web site: www.[facility] °S[facility] °Surgical drains are used to remove extra fluid that normally builds up in a surgical wound after surgery. A surgical drain helps to heal a surgical wound. Different kinds of surgical drains include: °· Active drains. These drains use suction to pull drainage away from the surgical wound. Drainage flows through a tube to a container outside of the body. With these drains, you need to keep the bulb or the drainage container flat (compressed) at all times, except while you empty it. Flattening the bulb or container creates suction. °· Passive drains. These drains allow fluid to drain naturally, by gravity. Drainage flows through a tube to a bandage (dressing) or a container outside of the body. Passive drains do not need to be emptied. °A drain is placed during surgery. Right after surgery, drainage is usually bright red and a little thicker than water. The drainage may gradually turn yellow or pink and become thinner. It is likely that your health care provider will remove the drain when the drainage stops or when the amount decreases to 1-2 Tbsp (15-30 mL) during a 24-hour period. °Supplies needed: °· Tape. °· Germ-free cleaning solution (sterile saline). °· Cotton swabs. °· Split gauze drain sponge: 4 x 4 inches (10 x 10 cm). °· Gauze square: 4 x 4 inches (10 x 10 cm). °How to care for your surgical drain °Care for your drain as told by your health care provider. This is important to help prevent infection. If your drain is placed at your back, or any other hard-to-reach area, ask another person to assist you in performing the following tasks: °General  care °· Keep the skin around the drain dry and covered with a dressing at all times. °· Check your drain area every day for signs of infection. Check for: °? Redness, swelling, or pain. °? Pus or a bad smell. °? Cloudy drainage. °? Tenderness or pressure at the drain exit site. °Changing the dressing °Follow instructions from your health care provider about how to change your dressing. Change your dressing at least once a day. Change it more often if needed to keep the dressing dry. Make sure you: °1. Gather your supplies. °2. Wash your hands with soap and water before you change your dressing. If soap and water are not available, use hand sanitizer. °3. Remove the old dressing. Avoid using scissors to do that. °4. Wash your hands with soap and water again after removing the old dressing. °5. Use sterile saline to clean your skin around the drain. You may need to use a cotton swab to clean the skin. °6. Place the tube through the slit in a drain sponge. Place the drain sponge so that it covers your wound. °7. Place the gauze square or another drain sponge on top of the drain sponge that is on the wound. Make sure the tube is between those layers. °8. Tape the dressing to your skin. °9. Tape the drainage tube to your   skin 1-2 inches (2.5-5 cm) below the place where the tube enters your body. Taping keeps the tube from pulling on any stitches (sutures) that you have. °10. Wash your hands with soap and water. °11. Write down the color of your drainage and how often you change your dressing. °How to empty your active drain ° °1. Make sure that you have a measuring cup that you can empty your drainage into. °2. Wash your hands with soap and water. If soap and water are not available, use hand sanitizer. °3. Loosen any pins or clips that hold the tube in place. °4. If your health care provider tells you to strip the tube to prevent clots and tube blockages: °? Hold the tube at the skin with one hand. Use your other hand  to pinch the tubing with your thumb and first finger. °? Gently move your fingers down the tube while squeezing very lightly. This clears any drainage, clots, or tissue from the tube. °? You may need to do this several times each day to keep the tube clear. Do not pull on the tube. °5. Open the bulb cap or the drain plug. Do not touch the inside of the cap or the bottom of the plug. °6. Turn the device upside down and gently squeeze. °7. Empty all of the drainage into the measuring cup. °8. Compress the bulb or the container and replace the cap or the plug. To compress the bulb or the container, squeeze it firmly in the middle while you close the cap or plug the container. °9. Write down the amount of drainage that you have in each 24-hour period. If you have less than 2 Tbsp (30 mL) of drainage during 24 hours, contact your health care provider. °10. Flush the drainage down the toilet. °11. Wash your hands with soap and water. °Contact a health care provider if: °· You have redness, swelling, or pain around your drain area. °· You have pus or a bad smell coming from your drain area. °· You have a fever or chills. °· The skin around your drain is warm to the touch. °· The amount of drainage that you have is increasing instead of decreasing. °· You have drainage that is cloudy. °· There is a sudden stop or a sudden decrease in the amount of drainage that you have. °· Your drain tube falls out. °· Your active drain does not stay compressed after you empty it. °Summary °· Surgical drains are used to remove extra fluid that normally builds up in a surgical wound after surgery. °· Different kinds of surgical drains include active drains and passive drains. Active drains use suction to pull drainage away from the surgical wound, and passive drains allow fluid to drain naturally. °· It is important to care for your drain to prevent infection. If your drain is placed at your back, or any other hard-to-reach area, ask  another person to assist you. °· Contact your health care provider if you have redness, swelling, or pain around your drain area. °This information is not intended to replace advice given to you by your health care provider. Make sure you discuss any questions you have with your health care provider. °Document Revised: 08/19/2018 Document Reviewed: 08/19/2018 °Elsevier Patient Education © 2020 [employer] ° °

## 2020-02-17 NOTE — Interval H&P Note (Signed)
History and Physical Interval Note:  02/17/2020 9:20 AM  Paige Gilbert  has presented today for surgery, with the diagnosis of RIGHT BREAST CANCER.  The various methods of treatment have been discussed with the patient and family. After consideration of risks, benefits and other options for treatment, the patient has consented to  Procedure(s): RIGHT MASTECTOMY MODIFIED RADICAL (Right) REMOVAL PORT-A-CATH (N/A) as a surgical intervention.  The patient's history has been reviewed, patient examined, no change in status, stable for surgery.  I have reviewed the patient's chart and labs.  Questions were answered to the patient's satisfaction.     Turner Daniels MD

## 2020-02-18 ENCOUNTER — Encounter (HOSPITAL_COMMUNITY): Payer: Self-pay | Admitting: Surgery

## 2020-02-18 LAB — CBC
HCT: 33.1 % — ABNORMAL LOW (ref 36.0–46.0)
Hemoglobin: 11 g/dL — ABNORMAL LOW (ref 12.0–15.0)
MCH: 29 pg (ref 26.0–34.0)
MCHC: 33.2 g/dL (ref 30.0–36.0)
MCV: 87.3 fL (ref 80.0–100.0)
Platelets: 157 10*3/uL (ref 150–400)
RBC: 3.79 MIL/uL — ABNORMAL LOW (ref 3.87–5.11)
RDW: 13.7 % (ref 11.5–15.5)
WBC: 6.4 10*3/uL (ref 4.0–10.5)
nRBC: 0 % (ref 0.0–0.2)

## 2020-02-18 LAB — BASIC METABOLIC PANEL
Anion gap: 6 (ref 5–15)
BUN: 12 mg/dL (ref 8–23)
CO2: 22 mmol/L (ref 22–32)
Calcium: 8 mg/dL — ABNORMAL LOW (ref 8.9–10.3)
Chloride: 109 mmol/L (ref 98–111)
Creatinine, Ser: 0.62 mg/dL (ref 0.44–1.00)
GFR calc Af Amer: >60 mL/min (ref >60)
GFR calc non Af Amer: >60 mL/min (ref >60)
Glucose, Bld: 375 mg/dL — ABNORMAL HIGH (ref 70–99)
Potassium: 3.8 mmol/L (ref 3.5–5.1)
Sodium: 137 mmol/L (ref 135–145)

## 2020-02-18 MED ORDER — IBUPROFEN 800 MG PO TABS
800.0000 mg | ORAL_TABLET | Freq: Three times a day (TID) | ORAL | 0 refills | Status: DC | PRN
Start: 2020-02-18 — End: 2021-03-07

## 2020-02-18 MED ORDER — OXYCODONE HCL 5 MG PO TABS
5.0000 mg | ORAL_TABLET | Freq: Four times a day (QID) | ORAL | 0 refills | Status: DC | PRN
Start: 2020-02-18 — End: 2020-06-28

## 2020-02-18 NOTE — Progress Notes (Signed)
Pt ambulated a full lap on unit

## 2020-02-18 NOTE — Discharge Summary (Signed)
Physician Discharge Summary  Patient ID: Paige Gilbert MRN: [id number] DOB/AGE: 63-Jan-1957 63 y.o.  Admit date: 02/17/2020 Discharge date: 02/18/2020  Admission Diagnoses:Active Problems:   Right breast cancer with T3 tumor, >5 cm in greatest dimension [facility])  Discharge Diagnoses:  Active Problems:   Right breast cancer with T3 tumor, >5 cm in greatest dimension [facility])   Discharged Condition: good  Hospital Course: Pt did well post mastectomy.  She tolerated her diet, ambulated and had good pain control.      Significant Diagnostic Studies: labs:  CBC    Component Value Date/Time   WBC 6.4 02/18/2020 0228   RBC 3.79 (L) 02/18/2020 0228   HGB 11.0 (L) 02/18/2020 0228   HGB 11.5 (L) 09/14/2019 0905   HCT 33.1 (L) 02/18/2020 0228   PLT 157 02/18/2020 0228   PLT 117 (L) 09/14/2019 0905   MCV 87.3 02/18/2020 0228   MCH 29.0 02/18/2020 0228   MCHC 33.2 02/18/2020 0228   RDW 13.7 02/18/2020 0228   LYMPHSABS 1.6 02/10/2020 1535   MONOABS 0.5 02/10/2020 1535   EOSABS 0.2 02/10/2020 1535   BASOSABS 0.1 02/10/2020 1535    Treatments: surgery: right MRM  Discharge Exam: Blood pressure (!) 99/53, pulse 64, temperature 98.3 F (36.8 C), temperature source Oral, resp. rate 16, height 5' 4.5" (1.638 m), weight 61.7 kg, SpO2 97 %. General appearance: alert and cooperative Resp: clear to auscultation bilaterally Cardio: regular rate and rhythm, S1, S2 normal, no murmur, click, rub or gallop Incision/Wound:RIGHT MASTECTOMY CDI flaps viable no hematoma drains serous   Disposition: Discharge disposition: 01-Home or Self Care       Discharge Instructions    Diet - low sodium heart healthy   Complete by: As directed    Increase activity slowly   Complete by: As directed      Allergies as of 02/18/2020   No Known Allergies     Medication List    TAKE these medications   CALCIUM CITRATE + D PO Take 1 tablet by mouth 3 (three) times a week.   ibuprofen 800 MG  tablet Commonly known as: ADVIL Take 1 tablet (800 mg total) by mouth every 8 (eight) hours as needed. What changed: Another medication with the same name was added. Make sure you understand how and when to take each.   ibuprofen 800 MG tablet Commonly known as: ADVIL Take 1 tablet (800 mg total) by mouth every 8 (eight) hours as needed. What changed: You were already taking a medication with the same name, and this prescription was added. Make sure you understand how and when to take each.   lidocaine-prilocaine cream Commonly known as: EMLA Apply to affected area once   LORazepam 0.5 MG tablet Commonly known as: Ativan Take 1 tablet (0.5 mg total) by mouth at bedtime as needed (Nausea or vomiting).   naproxen sodium 220 MG tablet Commonly known as: ALEVE Take 220 mg by mouth daily as needed (pain).   oxyCODONE 5 MG immediate release tablet Commonly known as: Oxy IR/ROXICODONE Take 1 tablet (5 mg total) by mouth every 6 (six) hours as needed for severe pain. What changed:   how much to take  how to take this  when to take this  reasons to take this   prochlorperazine 10 MG tablet Commonly known as: COMPAZINE Take 1 tablet (10 mg total) by mouth every 6 (six) hours as needed (Nausea or vomiting).        Signed: Turner Daniels MD  02/18/2020, 6:52 AM

## 2020-02-21 ENCOUNTER — Other Ambulatory Visit: Payer: Managed Care, Other (non HMO)

## 2020-02-21 ENCOUNTER — Ambulatory Visit: Payer: Managed Care, Other (non HMO) | Admitting: Oncology

## 2020-02-24 ENCOUNTER — Encounter: Payer: Self-pay | Admitting: *Deleted

## 2020-02-27 NOTE — Progress Notes (Addendum)
**Note Paige-Identified via Obfuscation** [place]  [phone] [phone]     ID: Paige Gilbert DOB: [birth year]  MR#: [id number]  KZL#:[id number]  Patient Care Team: [name], NP as PCP - General (Family Medicine) [name], RN as Oncology Nurse Navigator [name], RN as Oncology Nurse Navigator Amia Rynders, [name], MD as Consulting Physician (Oncology) [name], MD as Attending Physician (Radiation Oncology) [name], MD as Consulting Physician (General Surgery) [name], MD OTHER MD:  CHIEF COMPLAINT: Locally advanced estrogen receptor positive breast cancer  CURRENT TREATMENT: Herceptin, Perjeta, adjuvant radiation pending   INTERVAL HISTORY: Paige Gilbert returns today for follow up of her locally advanced breast cancer.  Since her last visit, she underwent right mastectomy on 02/17/2020 under Dr. Brantley Stage. Pathology from the procedure [id number]) showed: residual invasive and in situ ductal carcinoma status post neoadjuvant treatment, 7.5 cm; negative resection margins. Prognostic panel on this sample showed the estrogen receptor positive at 95%, strong, progesterone receptor 80%, strong to moderate.  HER-2 was 2+ by immunohistochemistry, with fish positive, ratio 2.98, number per cell 7.08  Out of 9 biopsied lymph nodes, 7 were positive for metastatic carcinoma. Extranodal extension was also present.   REVIEW OF SYSTEMS: Danaye did well with her surgery, with minimal pain, no evidence of lymphedema, and no unusual bleeding or fever.  She is experiencing some difficulty with range of motion and wonders when she will be able to get back to work.  Otherwise she has no unusual headaches visual changes cough phlegm production pleurisy shortness of breath or change in bowel or bladder habits.  Detailed review of systems was otherwise stable.  Incidentally she received the [place] vaccine x2 and tolerated it well.   HISTORY OF CURRENT  ILLNESS: From the original intake note:  Paige Gilbert has noted a difference between her right and left breast for several years.  She last had a mammogram she thinks about 7 or 8 years ago.  More recently it seemed to her that her right breast was becoming more firm.  She brought it to medical attention and underwent bilateral diagnostic mammography with tomography and right breast ultrasonography at [facility] on 07/07/2019 showing: heterogeneously dense breast tissue; right nipple retraction; architectural distortion with microcalcifications and irregular dense tissue in the upper-outer quadrant; at least 3 abnormal right axillary lymph nodes.  Accordingly on 07/13/2019 she proceeded to biopsy of the right breast area in question. The pathology from this procedure ([id number]) showed:   1. Right Breast, upper-outer quadrant, closer to the nipple  - invasive ductal carcinoma with micropapillary features, grade 2  - ductal carcinoma in situ, grade 2-3, solid and cribriform pattern with comedonecrosis  - Prognostic indicators significant for: estrogen receptor, 100% positive with strong staining intensity and progesterone receptor, 0% negative. HER2 equivocal by immunohistochemistry (2+), but negative by fluorescent in situ hybridization with a signals ratio 1.5 and number per cell 3.8.  2. Right Breast, upper-outer quadrant, axillary tail  - invasive ductal carcinoma, grade 2  - ductal carcinoma in situ, grade 2-3  - Prognostic indicators significant for: estrogen receptor, 100% positive and progesterone receptor, 50% positive, both with strong staining intensity. HER2 equivocal by immunohistochemistry (2+), but negative by fluorescent in situ hybridization with a signals ratio 1.9 and number per cell 5.3.  The patient's subsequent history is as detailed below.   PAST MEDICAL HISTORY: Past Medical History:  Diagnosis Date  . Anemia   . right breast ca dx'd 06/2019   chemo  .  Varicose vein   . Varicose veins of left lower extremity    "bluging ones at top- no pain"    PAST SURGICAL HISTORY: Past Surgical History:  Procedure Laterality Date  . MASTECTOMY MODIFIED RADICAL Right 02/17/2020   Procedure: RIGHT MASTECTOMY MODIFIED RADICAL;  Surgeon: [name], MD;  Location: [place];  Service: General;  Laterality: Right;  . NO PAST SURGERIES    . none    . PORT-A-CATH REMOVAL Right 02/17/2020   Procedure: REMOVAL PORT-A-CATH;  Surgeon: [name], MD;  Location: [place];  Service: General;  Laterality: Right;  . PORTACATH PLACEMENT Right 08/17/2019   Procedure: INSERTION PORT-A-CATH WITH ULTRASOUND;  Surgeon: [name], MD;  Location: [place];  Service: General;  Laterality: Right;  . wisdom teeth removal     remotely.     FAMILY HISTORY: Family History  Problem Relation Age of Onset  . Diabetes Father   . Blindness Mother   . Congestive Heart Failure Sister   . Gallbladder disease Sister        removed  . Diabetes Sister   . Colitis Sister    The patient's father died at age 97 from a stroke.  The patient's mother died from multiple medical problems not related to cancer at age 44.  The patient had 4 brothers, 7 sisters.  There is no breast ovarian or prostate cancer in the family to her knowledge.  She had one cousin with pancreatic cancer.   GYNECOLOGIC HISTORY:  No LMP recorded. Patient is postmenopausal. Menarche: 64 years old Age at first live birth: 64 years old [place] P 5 LMP 100 Contraceptive: Brief use of oral contraceptives as a teenager HRT no Hysterectomy?  No BSO?    SOCIAL HISTORY: (updated 07/2019)  Paige Gilbert works in a Engineer, agricultural, and Education administrator houses.  Her husband of 55 years [name] is a Chief Operating Officer.  Their children are Paige Gilbert, 65, who lives in [place] and is a Veterinary surgeon in the [employer]; Paige Gilbert 33 who works as a Designer, jewellery at [employer]; Paige Gilbert who lives in [place], and is not currently  employed; Paige Gilbert lives in [place], and Paige Gilbert who is an Armed forces training and education officer in [place].  The patient has 4 grandchildren all under the age of 6    ADVANCED DIRECTIVES: Husband [name] is her HCPOA.   HEALTH MAINTENANCE: Social History   Tobacco Use  . Smoking status: Never Smoker  . Smokeless tobacco: Never Used  Vaping Use  . Vaping Use: Never used  Substance Use Topics  . Alcohol use: Yes    Comment: rarely  . Drug use: No     Colonoscopy: never done  PAP: Remote  Bone density: Never   No Known Allergies  Current Outpatient Medications  Medication Sig Dispense Refill  . Calcium Citrate-Vitamin D (CALCIUM CITRATE + D PO) Take 1 tablet by mouth 3 (three) times a week.     Marland Kitchen ibuprofen (ADVIL) 800 MG tablet Take 1 tablet (800 mg total) by mouth every 8 (eight) hours as needed. (Patient not taking: Reported on 02/09/2020) 30 tablet 0  . ibuprofen (ADVIL) 800 MG tablet Take 1 tablet (800 mg total) by mouth every 8 (eight) hours as needed. 30 tablet 0  . lidocaine-prilocaine (EMLA) cream Apply to affected area once (Patient not taking: Reported on 02/09/2020) 30 g 3  . LORazepam (ATIVAN) 0.5 MG tablet Take 1 tablet (0.5 mg total) by mouth at bedtime as needed (Nausea or vomiting). (Patient not taking: Reported  on 02/09/2020) 30 tablet 0  . naproxen sodium (ALEVE) 220 MG tablet Take 220 mg by mouth daily as needed (pain).    Marland Kitchen oxyCODONE (OXY IR/ROXICODONE) 5 MG immediate release tablet Take 1 tablet (5 mg total) by mouth every 6 (six) hours as needed for severe pain. 15 tablet 0  . prochlorperazine (COMPAZINE) 10 MG tablet Take 1 tablet (10 mg total) by mouth every 6 (six) hours as needed (Nausea or vomiting). (Patient not taking: Reported on 02/09/2020) 30 tablet 1   No current facility-administered medications for this visit.    OBJECTIVE: White woman in no acute distress Vitals:   02/28/20 1101  BP: (!) 96/61  Pulse: 67  Resp: 18  Temp: 98.3 F (36.8 C)  SpO2:  100%     Body mass index is 23.27 kg/m.   Wt Readings from Last 3 Encounters:  02/28/20 137 lb 11.2 oz (62.5 kg)  02/17/20 136 lb (61.7 kg)  02/10/20 138 lb 11.2 oz (62.9 kg)  ECOG FS:1 - Symptomatic but completely ambulatory  Sclerae unicteric, EOMs intact Wearing a mask No cervical or supraclavicular adenopathy Lungs no rales or rhonchi Heart regular rate and rhythm Abd soft, nontender, positive bowel sounds MSK no focal spinal tenderness, no upper extremity lymphedema Neuro: nonfocal, well oriented, appropriate affect Breasts: The right breast is status post mastectomy.  The incision is healing very nicely, flat, no erythema or dehiscence, with one drain still in place.  Left breast and both axillae are benign.   LAB RESULTS:  CMP     Component Value Date/Time   NA 140 02/28/2020 1025   K 4.5 02/28/2020 1025   CL 107 02/28/2020 1025   CO2 26 02/28/2020 1025   GLUCOSE 80 02/28/2020 1025   BUN 18 02/28/2020 1025   CREATININE 0.80 02/28/2020 1025   CREATININE 0.79 08/04/2019 1546   CREATININE 0.76 03/27/2015 1146   CALCIUM 9.8 02/28/2020 1025   PROT 6.5 02/28/2020 1025   ALBUMIN 3.7 02/28/2020 1025   AST 18 02/28/2020 1025   AST 17 08/04/2019 1546   ALT 23 02/28/2020 1025   ALT 21 08/04/2019 1546   ALKPHOS 65 02/28/2020 1025   BILITOT 0.3 02/28/2020 1025   BILITOT 0.2 (L) 08/04/2019 1546   GFRNONAA >60 02/28/2020 1025   GFRNONAA >60 08/04/2019 1546   GFRNONAA 87 03/27/2015 1146   GFRAA >60 02/28/2020 1025   GFRAA >60 08/04/2019 1546   GFRAA >89 03/27/2015 1146    No results found for: TOTALPROTELP, ALBUMINELP, A1GS, A2GS, BETS, BETA2SER, GAMS, MSPIKE, SPEI  Lab Results  Component Value Date   WBC 5.2 02/28/2020   NEUTROABS 3.1 02/28/2020   HGB 13.0 02/28/2020   HCT 38.7 02/28/2020   MCV 87.6 02/28/2020   PLT 171 02/28/2020    No results found for: LABCA2  No components found for: CHYIFO277  No results for input(s): INR in the last 168 hours.  No  results found for: LABCA2  No results found for: AJO878  No results found for: MVE720  No results found for: NOB096  Lab Results  Component Value Date   CA2729 27.4 08/18/2019    No components found for: HGQUANT  No results found for: CEA1 / No results found for: CEA1   No results found for: AFPTUMOR  No results found for: CHROMOGRNA  No results found for: KPAFRELGTCHN, LAMBDASER, KAPLAMBRATIO (kappa/lambda light chains)  No results found for: HGBA, HGBA2QUANT, HGBFQUANT, HGBSQUAN (Hemoglobinopathy evaluation)   No results found for: LDH  Lab  Results  Component Value Date   IRON 94 02/15/2015   TIBC 272 02/15/2015   IRONPCTSAT 35 02/15/2015   (Iron and TIBC)  Lab Results  Component Value Date   FERRITIN 70 02/15/2015    Urinalysis No results found for: COLORURINE, APPEARANCEUR, LABSPEC, PHURINE, GLUCOSEU, HGBUR, BILIRUBINUR, KETONESUR, PROTEINUR, UROBILINOGEN, NITRITE, LEUKOCYTESUR   STUDIES: No results found.   ELIGIBLE FOR AVAILABLE RESEARCH PROTOCOL:   ASSESSMENT: 64 y.o. [place], [place] woman status post right breast biopsy x2 on 07/13/2019 for a clinical T3-T4 N1-2, stage III invasive ductal carcinoma, grade 2, estrogen receptor positive, progesterone receptor variable, HER-2 not amplified.  (a) breast MRI 08/15/2019 showed a 7.6 area of enhancement, and 5 suspicious right axillary lymph nodes as well as two 0.5 mm additional masses in the inferior right breast   (b) CT chest 08/10/2019 showed no evidence of metastatic disease  (c) bone scan 08/27/2019 showed a single focus of activity at T9,  (d) thoracic spine MRI 09/13/2019 showed degenerative changes, no evidence of metastatic disease  (1) neoadjuvant chemotherapy consisting of cyclophosphamide and doxorubicin in dose dense fashion x4 starting 08/18/2019, completed 09/28/2019, followed by weekly paclitaxel x12 starting 10/12/2019, to be completed 01/11/2020  (2) status post right modified radical  mastectomy 02/17/2020 for a residual T3 N2 invasive ductal carcinoma, with negative margins, estrogen and progesterone receptor positive, and now also HER-2 positive with a ratio of 2.98, number per cell 7.08  (3) adjuvant radiation to follow  (4) antiestrogens to start at the completion of local treatment  (5) trastuzumab/pertuzumab to start 03/15/2020, to continue for 1 year  (a) echocardiogram   PLAN: Jelitza did well with her surgery and will do even better once she gets her drain out and gets a little bit of rehab to regain her right upper extremity range of motion.  The big surprise of course is the HER-2 positivity.  This is not equivocal, it is quite definite according to these tests.  We discussed why this was read as negative before, namely the fact that cancers are heterogeneous.  The good news is that despite her very extensive residual tumor after neoadjuvant treatment, this gives [place] hope that her disease may be controlled long-term.  We discussed the possible toxicities side effects and complications of trastuzumab and Pertuzumab and also the fact that of course we had remove the port anticipating no further chemotherapy.  She is going to receive the first dose of Herceptin/Perjeta peripherally and if that goes well she would prefer to avoid t having a port replaced.  She will have an echocardiogram within the next week.  I am hopeful we can start her treatments 03/15/2020.  She understands she may go ahead and start radiation despite continuing anti-HER-2 treatments.  Once he completes radiation she will start antiestrogens  Total encounter time 40 minutes.Sarajane Jews C. Sindy Mccune, MD 02/28/20 5:15 PM Medical Oncology and Hematology [facility] [place], [place] [address] Tel. [phone]    Fax. [phone]   I, Wilburn Mylar, am acting as scribe for Dr. [name]. Boni Maclellan.  I, Lurline Del MD, have reviewed the above documentation  for accuracy and completeness, and I agree with the above.    *Total Encounter Time as defined by the Centers for Medicare and Medicaid Services includes, in addition to the face-to-face time of a patient visit (documented in the note above) non-face-to-face time: obtaining and reviewing outside history, ordering and reviewing medications, tests or procedures, care coordination (communications  with other health care professionals or caregivers) and documentation in the medical record.

## 2020-02-28 ENCOUNTER — Other Ambulatory Visit: Payer: Self-pay

## 2020-02-28 ENCOUNTER — Inpatient Hospital Stay (HOSPITAL_BASED_OUTPATIENT_CLINIC_OR_DEPARTMENT_OTHER): Payer: Managed Care, Other (non HMO) | Admitting: Oncology

## 2020-02-28 ENCOUNTER — Inpatient Hospital Stay: Payer: Managed Care, Other (non HMO)

## 2020-02-28 ENCOUNTER — Inpatient Hospital Stay: Payer: Managed Care, Other (non HMO) | Attending: Oncology

## 2020-02-28 VITALS — BP 96/61 | HR 67 | Temp 98.3°F | Resp 18 | Ht 64.5 in | Wt 137.7 lb

## 2020-02-28 DIAGNOSIS — Z5112 Encounter for antineoplastic immunotherapy: Secondary | ICD-10-CM | POA: Diagnosis not present

## 2020-02-28 DIAGNOSIS — Z170 Estrogen receptor positive status [ER+]: Secondary | ICD-10-CM | POA: Insufficient documentation

## 2020-02-28 DIAGNOSIS — C50911 Malignant neoplasm of unspecified site of right female breast: Secondary | ICD-10-CM | POA: Diagnosis not present

## 2020-02-28 DIAGNOSIS — C50411 Malignant neoplasm of upper-outer quadrant of right female breast: Secondary | ICD-10-CM | POA: Diagnosis not present

## 2020-02-28 LAB — CBC WITH DIFFERENTIAL/PLATELET
Abs Immature Granulocytes: 0 10*3/uL (ref 0.00–0.07)
Basophils Absolute: 0.1 10*3/uL (ref 0.0–0.1)
Basophils Relative: 1 %
Eosinophils Absolute: 0.1 10*3/uL (ref 0.0–0.5)
Eosinophils Relative: 2 %
HCT: 38.7 % (ref 36.0–46.0)
Hemoglobin: 13 g/dL (ref 12.0–15.0)
Immature Granulocytes: 0 %
Lymphocytes Relative: 29 %
Lymphs Abs: 1.5 10*3/uL (ref 0.7–4.0)
MCH: 29.4 pg (ref 26.0–34.0)
MCHC: 33.6 g/dL (ref 30.0–36.0)
MCV: 87.6 fL (ref 80.0–100.0)
Monocytes Absolute: 0.4 10*3/uL (ref 0.1–1.0)
Monocytes Relative: 8 %
Neutro Abs: 3.1 10*3/uL (ref 1.7–7.7)
Neutrophils Relative %: 60 %
Platelets: 171 10*3/uL (ref 150–400)
RBC: 4.42 MIL/uL (ref 3.87–5.11)
RDW: 13.1 % (ref 11.5–15.5)
WBC: 5.2 10*3/uL (ref 4.0–10.5)
nRBC: 0 % (ref 0.0–0.2)

## 2020-02-28 LAB — COMPREHENSIVE METABOLIC PANEL
ALT: 23 U/L (ref 0–44)
AST: 18 U/L (ref 15–41)
Albumin: 3.7 g/dL (ref 3.5–5.0)
Alkaline Phosphatase: 65 U/L (ref 38–126)
Anion gap: 7 (ref 5–15)
BUN: 18 mg/dL (ref 8–23)
CO2: 26 mmol/L (ref 22–32)
Calcium: 9.8 mg/dL (ref 8.9–10.3)
Chloride: 107 mmol/L (ref 98–111)
Creatinine, Ser: 0.8 mg/dL (ref 0.44–1.00)
GFR calc Af Amer: >60 mL/min (ref >60)
GFR calc non Af Amer: >60 mL/min (ref >60)
Glucose, Bld: 80 mg/dL (ref 70–99)
Potassium: 4.5 mmol/L (ref 3.5–5.1)
Sodium: 140 mmol/L (ref 135–145)
Total Bilirubin: 0.3 mg/dL (ref 0.3–1.2)
Total Protein: 6.5 g/dL (ref 6.5–8.1)

## 2020-02-28 LAB — SURGICAL PATHOLOGY

## 2020-02-29 ENCOUNTER — Telehealth: Payer: Self-pay | Admitting: Oncology

## 2020-02-29 NOTE — Telephone Encounter (Signed)
schedueld appts per 8/2 los. Left voicemail with appt date and time.

## 2020-03-09 NOTE — Progress Notes (Signed)
Pharmacist Chemotherapy Monitoring - Initial Assessment    Anticipated start date: 03/15/2020   Regimen:  . Are orders appropriate based on the patient's diagnosis, regimen, and cycle? Yes . Does the plan date match the patient's scheduled date? Yes . Is the sequencing of drugs appropriate? Yes . Are the premedications appropriate for the patient's regimen? Yes . Prior Authorization for treatment is: Pending o If applicable, is the correct biosimilar selected based on the patient's insurance? No   Organ Function and Labs: Marland Kitchen Are dose adjustments needed based on the patient's renal function, hepatic function, or hematologic function? Yes . Are appropriate labs ordered prior to the start of patient's treatment? Yes . Other organ system assessment, if indicated: trastuzumab: Echo/ MUGA and pertuzumab: Echo/ MUGA . The following baseline labs, if indicated, have been ordered: N/A  Dose Assessment: . Are the drug doses appropriate? Yes . Are the following correct: o Drug concentrations Yes o IV fluid compatible with drug Yes o Administration routes Yes o Timing of therapy Yes . If applicable, does the patient have documented access for treatment and/or plans for port-a-cath placement? no . If applicable, have lifetime cumulative doses been properly documented and assessed? yes Lifetime Dose Tracking  . Doxorubicin: 241.09 mg/m2 (408 mg) = 53.58 % of the maximum lifetime dose of 450 mg/m2  o   Toxicity Monitoring/Prevention: . The patient has the following take home antiemetics prescribed: Prochlorperazine . The patient has the following take home medications prescribed: N/A . Medication allergies and previous infusion related reactions, if applicable, have been reviewed and addressed. Yes . The patient's current medication list has been assessed for drug-drug interactions with their chemotherapy regimen. no significant drug-drug interactions were identified on review.  Order  Review: . Are the treatment plan orders signed? Yes . Is the patient scheduled to see a provider prior to their treatment? Yes  I verify that I have reviewed each item in the above checklist and answered each question accordingly.  Roselind Klus D 03/09/2020 10:03 AM

## 2020-03-09 NOTE — Progress Notes (Signed)
.  The following biosimilar Calla Kicks has been selected for use in this patient due to insurance requirements.  Henreitta Leber, PharmD

## 2020-03-10 ENCOUNTER — Other Ambulatory Visit: Payer: Self-pay

## 2020-03-10 ENCOUNTER — Ambulatory Visit (HOSPITAL_COMMUNITY): Payer: Managed Care, Other (non HMO) | Attending: Cardiology

## 2020-03-10 DIAGNOSIS — C50911 Malignant neoplasm of unspecified site of right female breast: Secondary | ICD-10-CM

## 2020-03-10 DIAGNOSIS — Z0189 Encounter for other specified special examinations: Secondary | ICD-10-CM | POA: Diagnosis not present

## 2020-03-10 DIAGNOSIS — C50411 Malignant neoplasm of upper-outer quadrant of right female breast: Secondary | ICD-10-CM

## 2020-03-10 DIAGNOSIS — Z170 Estrogen receptor positive status [ER+]: Secondary | ICD-10-CM | POA: Diagnosis present

## 2020-03-10 LAB — ECHOCARDIOGRAM COMPLETE
Area-P 1/2: 3.83 cm2
S' Lateral: 3.5 cm

## 2020-03-10 NOTE — Progress Notes (Signed)
2D Echocardiogram has been completed.  Amay Mijangos, RCS 

## 2020-03-10 NOTE — Progress Notes (Signed)
Location of Breast Cancer: Malignant neoplasm of upper-outer quadrant of RIGHT breast, estrogen receptor positive  Histology per Pathology Report:  02/17/2020 FINAL MICROSCOPIC DIAGNOSIS:  A. BREAST, RIGHT, MODIFIED RADICAL MASTECTOMY:  - Residual invasive and in situ ductal carcinoma status post neoadjuvant  treatment.  - Margins of resection are not involved (Closest margin: < 1 mm, deep).  - Metastatic carcinoma in (7) of (9) lymph nodes.   Receptor Status:   Right Breast, upper-outer quadrant, closer to the nipple: ER(100%), PR (negative), Her2-neu (negative), Ki-67(no reported)  Right Breast, upper-outer quadrant, axillary tail: ER(100%), PR (50%), Her2-neu (negative), Ki-67(not reported)  Did patient present with symptoms (if so, please note symptoms) or was this found on screening mammography?:  Patient had a bilateral diagnostic mammography with tomography and right breast ultrasonography at [facility] on 07/07/2019 showing: heterogeneously dense breast tissue; right nipple retraction; architectural distortion with microcalcifications and irregular dense tissue in the upper-outer quadrant; at least 3 abnormal right axillary lymph nodes.  Past/Anticipated interventions by surgeon, if any: 02/17/2020 Dr. Marcello Moores [place] Right Modified Radical Mastectomy   Past/Anticipated interventions by medical oncology, if any:  Under care of Dr. Sarajane Jews Magrinat:  02/28/2020 (1) neoadjuvant chemotherapy consisting of cyclophosphamide and doxorubicin in dose dense fashion x4 starting 08/18/2019, completed 09/28/2019, followed by weekly paclitaxel x12 starting 10/12/2019, to be completed 01/11/2020 (2) status post right modified radical mastectomy 02/17/2020 for a residual T3 N2 invasive ductal carcinoma, with negative margins, estrogen and progesterone receptor positive, and now also HER-2 positive with a ratio of 2.98, number per cell 7.08 (3) adjuvant radiation to follow (4) antiestrogens  to start at the completion of local treatment (5) trastuzumab/pertuzumab to start 03/15/2020, to continue for 1 year             (a) echocardiogram  Lymphedema issues, if any:  Patient denies    Pain issues, if any:  Occasionally some discomfort to right arm if she stretches her arm too far. New pain to the left wrist that she plans to address in he MedOnc appointment on Wednesday. Numbness along underside of right arm   SAFETY ISSUES:  Prior radiation? No  Pacemaker/ICD? No  Possible current pregnancy? No  Is the patient on methotrexate? No  Current Complaints / other details:  Surgical drain scheduled to be removed next week 03/20/20

## 2020-03-13 ENCOUNTER — Ambulatory Visit
Admission: RE | Admit: 2020-03-13 | Discharge: 2020-03-13 | Disposition: A | Discharge status: Home / Self Care | Payer: Managed Care, Other (non HMO) | Source: Ambulatory Visit | Attending: Radiation Oncology | Admitting: Radiation Oncology

## 2020-03-13 ENCOUNTER — Encounter: Payer: Self-pay | Admitting: *Deleted

## 2020-03-13 ENCOUNTER — Telehealth: Payer: Self-pay | Admitting: Licensed Clinical Social Worker

## 2020-03-13 ENCOUNTER — Other Ambulatory Visit: Payer: Self-pay

## 2020-03-13 ENCOUNTER — Encounter: Payer: Self-pay | Admitting: Radiation Oncology

## 2020-03-13 VITALS — BP 108/65 | HR 71 | Temp 98.2°F | Resp 18 | Ht 64.5 in | Wt 139.1 lb

## 2020-03-13 DIAGNOSIS — Z170 Estrogen receptor positive status [ER+]: Secondary | ICD-10-CM | POA: Diagnosis not present

## 2020-03-13 DIAGNOSIS — C50411 Malignant neoplasm of upper-outer quadrant of right female breast: Secondary | ICD-10-CM | POA: Insufficient documentation

## 2020-03-13 DIAGNOSIS — Z79899 Other long term (current) drug therapy: Secondary | ICD-10-CM | POA: Insufficient documentation

## 2020-03-13 NOTE — Telephone Encounter (Signed)
[facility] Psychosocial Distress Screening Clinical Social Work  Clinical Social Work was referred by distress screening protocol.  The patient scored a 5 on the Psychosocial Distress Thermometer which indicates moderate distress. Clinical Social Worker attempted to contact patient by phone to assess for distress and other psychosocial needs.   No answer. Left VM with direct contact information.  ONCBCN DISTRESS SCREENING 03/13/2020  Screening Type Initial Screening  Distress experienced in past week (1-10) 5  Emotional problem type Adjusting to illness;Feeling hopeless;Adjusting to appearance changes  Spiritual/Religous concerns type Facing my mortality;Loss of sense of purpose  Physician notified of physical symptoms Yes  Referral to clinical psychology No  Referral to clinical social work No  Referral to financial advocate No  Referral to support programs Yes  Referral to palliative care No    Clinical Social Worker follow up needed: Yes.    If yes, follow up plan: CSW will attempt to contact once more in the next 3 days  Kristina Bertone E, LCSW

## 2020-03-13 NOTE — Progress Notes (Signed)
Radiation Oncology         [phone] ________________________________  Name: Paige Gilbert MRN: [id number]  Date: 03/13/2020  DOB: 06/02/1956  Follow-Up Visit Note  Outpatient  CC: [name], NP  [name], [name]*  Diagnosis:      ICD-10-CM   1. Malignant neoplasm of upper-outer quadrant of right breast in female, estrogen receptor positive ([place])  C50.411 Ambulatory referral to Social Work   Z17.0     Cancer Staging Malignant neoplasm of upper-outer quadrant of right breast in female, estrogen receptor positive ([place]) Staging form: Breast, AJCC 8th Edition - Clinical: Stage IIIB (cT4, cN2, cM0, G2, ER+, PR-, HER2-) - Unsigned - Pathologic stage from 02/17/2020: No Stage Recommended (ypT3, pN2a, cM0, G2, ER+, PR+, HER2+) - Signed by Gardenia Phlegm, NP on 03/01/2020  CHIEF COMPLAINT: Here to discuss management of right breast cancer  Narrative:  The patient returns today for follow-up. She has undergone neoadjuvant chemotherapy consisting of cyclophosphamide and doxorubicin in dose dense fashion x4 followed by weekly paclitaxel x12 completed 01/11/2020.   She then underwent mastectomy 3 weeks ago:  Histology per Pathology Report:  02/17/2020 FINAL MICROSCOPIC DIAGNOSIS:  A. BREAST, RIGHT, MODIFIED RADICAL MASTECTOMY:  - Residual invasive and in situ ductal carcinoma status post neoadjuvant  treatment.  - Margins of resection are not involved (Closest margin: < 1 mm, deep).  - Metastatic carcinoma in (7) of (9) lymph nodes.   Now her cancer is also HER-2 positive so she will receive trastuzumab/pertuzumab to start 03/15/2020, to continue for 1 year.     Lymphedema issues, if any:  Patient denies    Pain issues, if any:  Occasionally some discomfort to right arm if she stretches her arm too far. New pain to the left wrist that she plans to address in her MedOnc appointment on Wednesday. Numbness along underside of right arm   SAFETY ISSUES:  Prior  radiation? No  Pacemaker/ICD? No  Possible current pregnancy? No  Is the patient on methotrexate? No  Current Complaints / other details:  Surgical drain scheduled to be removed next week 03/20/20           ALLERGIES:  has No Known Allergies.  Meds: Current Outpatient Medications  Medication Sig Dispense Refill  . Calcium Citrate-Vitamin D (CALCIUM CITRATE + D PO) Take 1 tablet by mouth 3 (three) times a week.  (Patient not taking: Reported on 03/13/2020)    . ibuprofen (ADVIL) 800 MG tablet Take 1 tablet (800 mg total) by mouth every 8 (eight) hours as needed. (Patient not taking: Reported on 02/09/2020) 30 tablet 0  . ibuprofen (ADVIL) 800 MG tablet Take 1 tablet (800 mg total) by mouth every 8 (eight) hours as needed. (Patient not taking: Reported on 03/13/2020) 30 tablet 0  . lidocaine-prilocaine (EMLA) cream Apply to affected area once (Patient not taking: Reported on 02/09/2020) 30 g 3  . LORazepam (ATIVAN) 0.5 MG tablet Take 1 tablet (0.5 mg total) by mouth at bedtime as needed (Nausea or vomiting). (Patient not taking: Reported on 02/09/2020) 30 tablet 0  . naproxen sodium (ALEVE) 220 MG tablet Take 220 mg by mouth daily as needed (pain). (Patient not taking: Reported on 03/13/2020)    . oxyCODONE (OXY IR/ROXICODONE) 5 MG immediate release tablet Take 1 tablet (5 mg total) by mouth every 6 (six) hours as needed for severe pain. (Patient not taking: Reported on 03/13/2020) 15 tablet 0  . prochlorperazine (COMPAZINE) 10 MG tablet Take 1 tablet (10 mg  total) by mouth every 6 (six) hours as needed (Nausea or vomiting). (Patient not taking: Reported on 02/09/2020) 30 tablet 1   No current facility-administered medications for this encounter.    Physical Findings:  height is 5' 4.5" (1.638 m) and weight is 139 lb 2 oz (63.1 kg). Her oral temperature is 98.2 F (36.8 C). Her blood pressure is 108/65 and her pulse is 71. Her respiration is 18 and oxygen saturation is 100%. .     General: Alert  and oriented, in no acute distress Psychiatric: Judgment and insight are intact. Affect is appropriate. Breast exam reveals s/p right mastectomy; surgical scars healing well, drain in place MSK: able to raise arms above her head   Lab Findings: Lab Results  Component Value Date   WBC 5.2 02/28/2020   HGB 13.0 02/28/2020   HCT 38.7 02/28/2020   MCV 87.6 02/28/2020   PLT 171 02/28/2020      Radiographic Findings: ECHOCARDIOGRAM COMPLETE  Result Date: 03/10/2020    ECHOCARDIOGRAM REPORT   Patient Name:   [name] Date of Exam: 03/10/2020 Medical Rec #:  [id number]         Height:       64.5 in Accession #:    [id number]        Weight:       137.7 lb Date of Birth:  Sep 29, 1955         BSA:          1.678 m Patient Age:    64 years          BP:           115/65 mmHg Patient Gender: F                 HR:           68 bpm. Exam Location:  Outpatient Procedure: 2D Echo, 3D Echo, Cardiac Doppler, Color Doppler and Strain Analysis Indications:    Z51.11 Encounter for antineoplastic chemotheraphy  History:        Patient has prior history of Echocardiogram examinations, most                 recent 08/10/2019. Breast Cancer.  Sonographer:    Cloria Spring RCS Referring Phys: [facility]  1. Left ventricular ejection fraction, by estimation, is 60 to 65%. The left ventricle has normal function. The left ventricle has no regional wall motion abnormalities. Left ventricular diastolic parameters were normal. The average left ventricular global longitudinal strain is -15.6 %. GLS likely underestimated due to poor endocardial tracking.  2. Right ventricular systolic function is normal. The right ventricular size is normal. There is normal pulmonary artery systolic pressure.  3. Left atrial size was mildly dilated.  4. The mitral valve is normal in structure. Trivial mitral valve regurgitation.  5. The aortic valve is normal in structure. Aortic valve regurgitation is not visualized.   6. The inferior vena cava is normal in size with greater than 50% respiratory variability, suggesting right atrial pressure of 3 mmHg. FINDINGS  Left Ventricle: Left ventricular ejection fraction, by estimation, is 60 to 65%. The left ventricle has normal function. The left ventricle has no regional wall motion abnormalities. The average left ventricular global longitudinal strain is -15.6 %. The left ventricular internal cavity size was normal in size. There is no left ventricular hypertrophy. Left ventricular diastolic parameters were normal. Right Ventricle: The right ventricular size is normal. No increase in right ventricular  wall thickness. Right ventricular systolic function is normal. There is normal pulmonary artery systolic pressure. The tricuspid regurgitant velocity is 2.16 m/s, and  with an assumed right atrial pressure of 3 mmHg, the estimated right ventricular systolic pressure is 73.7 mmHg. Left Atrium: Left atrial size was mildly dilated. Right Atrium: Right atrial size was normal in size. Pericardium: There is no evidence of pericardial effusion. Mitral Valve: The mitral valve is normal in structure. Trivial mitral valve regurgitation. Tricuspid Valve: The tricuspid valve is normal in structure. Tricuspid valve regurgitation is trivial. Aortic Valve: The aortic valve is normal in structure. Aortic valve regurgitation is not visualized. Pulmonic Valve: The pulmonic valve was grossly normal. Pulmonic valve regurgitation is not visualized. Aorta: The aortic root and ascending aorta are structurally normal, with no evidence of dilitation. Venous: The inferior vena cava is normal in size with greater than 50% respiratory variability, suggesting right atrial pressure of 3 mmHg. IAS/Shunts: No atrial level shunt detected by color flow Doppler.  LEFT VENTRICLE PLAX 2D LVIDd:         4.30 cm  Diastology LVIDs:         3.50 cm  LV e' lateral:   9.90 cm/s LV PW:         0.80 cm  LV E/e' lateral: 6.4 LV IVS:         0.70 cm  LV e' medial:    7.29 cm/s LVOT diam:     1.90 cm  LV E/e' medial:  8.8 LV SV:         51 LV SV Index:   30       2D Longitudinal Strain LVOT Area:     2.84 cm 2D Strain GLS Avg:     -15.6 %                          3D Volume EF:                         3D EF:        58 %                         LV EDV:       92 ml                         LV ESV:       38 ml                         LV SV:        54 ml RIGHT VENTRICLE             IVC RV Basal diam:  2.60 cm     IVC diam: 2.20 cm RV S prime:     11.10 cm/s TAPSE (M-mode): 2.3 cm LEFT ATRIUM             Index       RIGHT ATRIUM           Index LA Vol (A2C):   62.6 ml 37.30 ml/m RA Area:     15.10 cm LA Vol (A4C):   45.4 ml 27.05 ml/m RA Volume:   37.60 ml  22.40 ml/m LA Biplane Vol: 55.0 ml 32.77 ml/m  AORTIC VALVE LVOT Vmax:   85.50 cm/s LVOT Vmean:  62.600  cm/s LVOT VTI:    0.180 m  AORTA Ao Root diam: 3.00 cm MITRAL VALVE               TRICUSPID VALVE MV Area (PHT): 3.83 cm    TR Peak grad:   18.7 mmHg MV Decel Time: 198 msec    TR Vmax:        216.00 cm/s MV E velocity: 63.80 cm/s MV A velocity: 57.00 cm/s  SHUNTS MV E/A ratio:  1.12        Systemic VTI:  0.18 m                            Systemic Diam: 1.90 cm Glori Bickers MD Electronically signed by Glori Bickers MD Signature Date/Time: 03/10/2020/6:02:22 PM    Final     Impression/Plan: We discussed adjuvant radiotherapy today.  I recommend 6 weeks of adjuvant radiotherapy to the right chest wall, IM nodes, PAB/SCV nodes, in order to reduce risk of locoregional recurrence by 2/3.  I reviewed the logistics, benefits, risks, and potential side effects of this treatment in detail. Risks may include but not necessary be limited to acute and late injury tissue in the radiation fields such as skin irritation (change in color/pigmentation, itching, dryness, pain, peeling). She may experience fatigue. We also discussed possible risk of long term cosmetic changes or scar tissue. There is  also a smaller risk for lung toxicity/ internal organ injury, lymphedema, musculoskeletal changes, rib fragility or late chronic non-healing soft tissue wound.    The patient asked good questions which I answered to her satisfaction. She is enthusiastic about proceeding with treatment. A consent form has been signed and placed in her chart.  Simulation attempted today, but she had too much discomfort raising arm. Exercises advised to help w/ this. Will move appt back by >1wk, and reattempt. Anticipate starting RT Aug 30 after drain removal.  On date of service, in total, I spent 30 minutes on this encounter. Patient was seen in person.  _____________________________________   [name], MD

## 2020-03-15 ENCOUNTER — Inpatient Hospital Stay: Payer: Managed Care, Other (non HMO)

## 2020-03-15 ENCOUNTER — Other Ambulatory Visit: Payer: Self-pay

## 2020-03-15 ENCOUNTER — Inpatient Hospital Stay (HOSPITAL_BASED_OUTPATIENT_CLINIC_OR_DEPARTMENT_OTHER): Payer: Managed Care, Other (non HMO) | Admitting: Nurse Practitioner

## 2020-03-15 ENCOUNTER — Encounter: Payer: Self-pay | Admitting: Nurse Practitioner

## 2020-03-15 VITALS — BP 114/54 | HR 69 | Temp 97.8°F | Resp 18 | Ht 64.0 in | Wt 139.6 lb

## 2020-03-15 DIAGNOSIS — Z170 Estrogen receptor positive status [ER+]: Secondary | ICD-10-CM | POA: Diagnosis not present

## 2020-03-15 DIAGNOSIS — C50411 Malignant neoplasm of upper-outer quadrant of right female breast: Secondary | ICD-10-CM

## 2020-03-15 DIAGNOSIS — C50911 Malignant neoplasm of unspecified site of right female breast: Secondary | ICD-10-CM

## 2020-03-15 DIAGNOSIS — Z5112 Encounter for antineoplastic immunotherapy: Secondary | ICD-10-CM | POA: Diagnosis not present

## 2020-03-15 LAB — COMPREHENSIVE METABOLIC PANEL
ALT: 29 U/L (ref 0–44)
AST: 22 U/L (ref 15–41)
Albumin: 3.5 g/dL (ref 3.5–5.0)
Alkaline Phosphatase: 72 U/L (ref 38–126)
Anion gap: 7 (ref 5–15)
BUN: 13 mg/dL (ref 8–23)
CO2: 23 mmol/L (ref 22–32)
Calcium: 9.3 mg/dL (ref 8.9–10.3)
Chloride: 109 mmol/L (ref 98–111)
Creatinine, Ser: 0.73 mg/dL (ref 0.44–1.00)
GFR calc Af Amer: >60 mL/min (ref >60)
GFR calc non Af Amer: >60 mL/min (ref >60)
Glucose, Bld: 99 mg/dL (ref 70–99)
Potassium: 4.1 mmol/L (ref 3.5–5.1)
Sodium: 139 mmol/L (ref 135–145)
Total Bilirubin: 0.3 mg/dL (ref 0.3–1.2)
Total Protein: 6.2 g/dL — ABNORMAL LOW (ref 6.5–8.1)

## 2020-03-15 LAB — CBC WITH DIFFERENTIAL/PLATELET
Abs Immature Granulocytes: 0.01 10*3/uL (ref 0.00–0.07)
Basophils Absolute: 0.1 10*3/uL (ref 0.0–0.1)
Basophils Relative: 1 %
Eosinophils Absolute: 0.1 10*3/uL (ref 0.0–0.5)
Eosinophils Relative: 2 %
HCT: 37.6 % (ref 36.0–46.0)
Hemoglobin: 12.7 g/dL (ref 12.0–15.0)
Immature Granulocytes: 0 %
Lymphocytes Relative: 30 %
Lymphs Abs: 1.4 10*3/uL (ref 0.7–4.0)
MCH: 29.1 pg (ref 26.0–34.0)
MCHC: 33.8 g/dL (ref 30.0–36.0)
MCV: 86.2 fL (ref 80.0–100.0)
Monocytes Absolute: 0.3 10*3/uL (ref 0.1–1.0)
Monocytes Relative: 7 %
Neutro Abs: 2.8 10*3/uL (ref 1.7–7.7)
Neutrophils Relative %: 60 %
Platelets: 151 10*3/uL (ref 150–400)
RBC: 4.36 MIL/uL (ref 3.87–5.11)
RDW: 12.5 % (ref 11.5–15.5)
WBC: 4.7 10*3/uL (ref 4.0–10.5)
nRBC: 0 % (ref 0.0–0.2)

## 2020-03-15 MED ORDER — SODIUM CHLORIDE 0.9 % IV SOLN
840.0000 mg | Freq: Once | INTRAVENOUS | Status: AC
  Administered 2020-03-15: 840 mg via INTRAVENOUS
  Filled 2020-03-15: qty 28

## 2020-03-15 MED ORDER — DIPHENHYDRAMINE HCL 25 MG PO CAPS
ORAL_CAPSULE | ORAL | Status: AC
  Filled 2020-03-15: qty 1

## 2020-03-15 MED ORDER — DIPHENHYDRAMINE HCL 25 MG PO CAPS
25.0000 mg | ORAL_CAPSULE | Freq: Once | ORAL | Status: AC
  Administered 2020-03-15: 25 mg via ORAL

## 2020-03-15 MED ORDER — ACETAMINOPHEN 325 MG PO TABS
650.0000 mg | ORAL_TABLET | Freq: Once | ORAL | Status: AC
  Administered 2020-03-15: 650 mg via ORAL

## 2020-03-15 MED ORDER — ACETAMINOPHEN 325 MG PO TABS
ORAL_TABLET | ORAL | Status: AC
  Filled 2020-03-15: qty 2

## 2020-03-15 MED ORDER — TRASTUZUMAB-ANNS CHEMO 150 MG IV SOLR
8.0000 mg/kg | Freq: Once | INTRAVENOUS | Status: AC
  Administered 2020-03-15: 504 mg via INTRAVENOUS
  Filled 2020-03-15: qty 24

## 2020-03-15 MED ORDER — SODIUM CHLORIDE 0.9 % IV SOLN
Freq: Once | INTRAVENOUS | Status: AC
  Filled 2020-03-15: qty 250

## 2020-03-15 NOTE — Patient Instructions (Signed)
[facility] Discharge Instructions for Patients Receiving Chemotherapy  Today you received the following chemotherapy agents Trastuzumab; Pertuzumab  To help prevent nausea and vomiting after your treatment, we encourage you to take your nausea medication as directed   If you develop nausea and vomiting that is not controlled by your nausea medication, call the clinic.   BELOW ARE SYMPTOMS THAT SHOULD BE REPORTED IMMEDIATELY:  *FEVER GREATER THAN 100.5 F  *CHILLS WITH OR WITHOUT FEVER  NAUSEA AND VOMITING THAT IS NOT CONTROLLED WITH YOUR NAUSEA MEDICATION  *UNUSUAL SHORTNESS OF BREATH  *UNUSUAL BRUISING OR BLEEDING  TENDERNESS IN MOUTH AND THROAT WITH OR WITHOUT PRESENCE OF ULCERS  *URINARY PROBLEMS  *BOWEL PROBLEMS  UNUSUAL RASH Items with * indicate a potential emergency and should be followed up as soon as possible.  Feel free to call the clinic should you have any questions or concerns. The clinic phone number is [phone].  Please show the [facility] at check-in to the Emergency Department and triage nurse.  Trastuzumab injection for infusion What is this medicine? TRASTUZUMAB (tras TOO zoo mab) is a monoclonal antibody. It is used to treat breast cancer and stomach cancer. This medicine may be used for other purposes; ask your health care provider or pharmacist if you have questions. COMMON BRAND NAME(S): Herceptin, Galvin Proffer, Trazimera What should I tell my health care provider before I take this medicine? They need to know if you have any of these conditions:  heart disease  heart failure  lung or breathing disease, like asthma  an unusual or allergic reaction to trastuzumab, benzyl alcohol, or other medications, foods, dyes, or preservatives  pregnant or trying to get pregnant  breast-feeding How should I use this medicine? This drug is given as an infusion into a vein. It is administered in a hospital  or clinic by a specially trained health care professional. Talk to your pediatrician regarding the use of this medicine in children. This medicine is not approved for use in children. Overdosage: If you think you have taken too much of this medicine contact a poison control center or emergency room at once. NOTE: This medicine is only for you. Do not share this medicine with others. What if I miss a dose? It is important not to miss a dose. Call your doctor or health care professional if you are unable to keep an appointment. What may interact with this medicine? This medicine may interact with the following medications:  certain types of chemotherapy, such as daunorubicin, doxorubicin, epirubicin, and idarubicin This list may not describe all possible interactions. Give your health care provider a list of all the medicines, herbs, non-prescription drugs, or dietary supplements you use. Also tell them if you smoke, drink alcohol, or use illegal drugs. Some items may interact with your medicine. What should I watch for while using this medicine? Visit your doctor for checks on your progress. Report any side effects. Continue your course of treatment even though you feel ill unless your doctor tells you to stop. Call your doctor or health care professional for advice if you get a fever, chills or sore throat, or other symptoms of a cold or flu. Do not treat yourself. Try to avoid being around people who are sick. You may experience fever, chills and shaking during your first infusion. These effects are usually mild and can be treated with other medicines. Report any side effects during the infusion to your health care professional. Fever and  chills usually do not happen with later infusions. Do not become pregnant while taking this medicine or for 7 months after stopping it. Women should inform their doctor if they wish to become pregnant or think they might be pregnant. Women of child-bearing potential  will need to have a negative pregnancy test before starting this medicine. There is a potential for serious side effects to an unborn child. Talk to your health care professional or pharmacist for more information. Do not breast-feed an infant while taking this medicine or for 7 months after stopping it. Women must use effective birth control with this medicine. What side effects may I notice from receiving this medicine? Side effects that you should report to your doctor or health care professional as soon as possible:  allergic reactions like skin rash, itching or hives, swelling of the face, lips, or tongue  chest pain or palpitations  cough  dizziness  feeling faint or lightheaded, falls  fever  general ill feeling or flu-like symptoms  signs of worsening heart failure like breathing problems; swelling in your legs and feet  unusually weak or tired Side effects that usually do not require medical attention (report to your doctor or health care professional if they continue or are bothersome):  bone pain  changes in taste  diarrhea  joint pain  nausea/vomiting  weight loss This list may not describe all possible side effects. Call your doctor for medical advice about side effects. You may report side effects to FDA at 1-800-FDA-1088. Where should I keep my medicine? This drug is given in a hospital or clinic and will not be stored at home. NOTE: This sheet is a summary. It may not cover all possible information. If you have questions about this medicine, talk to your doctor, pharmacist, or health care provider.  2020 Elsevier/Gold Standard (2016-07-09 14:37:52)  Pertuzumab injection What is this medicine? PERTUZUMAB (per TOOZ ue mab) is a monoclonal antibody. It is used to treat breast cancer. This medicine may be used for other purposes; ask your health care provider or pharmacist if you have questions. COMMON BRAND NAME(S): PERJETA What should I tell my health care  provider before I take this medicine? They need to know if you have any of these conditions:  heart disease  heart failure  high blood pressure  history of irregular heart beat  recent or ongoing radiation therapy  an unusual or allergic reaction to pertuzumab, other medicines, foods, dyes, or preservatives  pregnant or trying to get pregnant  breast-feeding How should I use this medicine? This medicine is for infusion into a vein. It is given by a health care professional in a hospital or clinic setting. Talk to your pediatrician regarding the use of this medicine in children. Special care may be needed. Overdosage: If you think you have taken too much of this medicine contact a poison control center or emergency room at once. NOTE: This medicine is only for you. Do not share this medicine with others. What if I miss a dose? It is important not to miss your dose. Call your doctor or health care professional if you are unable to keep an appointment. What may interact with this medicine? Interactions are not expected. Give your health care provider a list of all the medicines, herbs, non-prescription drugs, or dietary supplements you use. Also tell them if you smoke, drink alcohol, or use illegal drugs. Some items may interact with your medicine. This list may not describe all possible interactions. Give your  health care provider a list of all the medicines, herbs, non-prescription drugs, or dietary supplements you use. Also tell them if you smoke, drink alcohol, or use illegal drugs. Some items may interact with your medicine. What should I watch for while using this medicine? Your condition will be monitored carefully while you are receiving this medicine. Report any side effects. Continue your course of treatment even though you feel ill unless your doctor tells you to stop. Do not become pregnant while taking this medicine or for 7 months after stopping it. Women should inform their  doctor if they wish to become pregnant or think they might be pregnant. Women of child-bearing potential will need to have a negative pregnancy test before starting this medicine. There is a potential for serious side effects to an unborn child. Talk to your health care professional or pharmacist for more information. Do not breast-feed an infant while taking this medicine or for 7 months after stopping it. Women must use effective birth control with this medicine. Call your doctor or health care professional for advice if you get a fever, chills or sore throat, or other symptoms of a cold or flu. Do not treat yourself. Try to avoid being around people who are sick. You may experience fever, chills, and headache during the infusion. Report any side effects during the infusion to your health care professional. What side effects may I notice from receiving this medicine? Side effects that you should report to your doctor or health care professional as soon as possible:  breathing problems  chest pain or palpitations  dizziness  feeling faint or lightheaded  fever or chills  skin rash, itching or hives  sore throat  swelling of the face, lips, or tongue  swelling of the legs or ankles  unusually weak or tired Side effects that usually do not require medical attention (report to your doctor or health care professional if they continue or are bothersome):  diarrhea  hair loss  nausea, vomiting  tiredness This list may not describe all possible side effects. Call your doctor for medical advice about side effects. You may report side effects to FDA at 1-800-FDA-1088. Where should I keep my medicine? This drug is given in a hospital or clinic and will not be stored at home. NOTE: This sheet is a summary. It may not cover all possible information. If you have questions about this medicine, talk to your doctor, pharmacist, or health care provider.  2020 Elsevier/Gold Standard  (2015-08-17 12:08:50)

## 2020-03-15 NOTE — Progress Notes (Signed)
Paige Gilbert   [phone] [phone]   Clinic Follow up Note   Patient Care Team: [name], NP as PCP - General (Family Medicine) [name], RN as Oncology Nurse Navigator [name], RN as Oncology Nurse Navigator Magrinat, [name], MD as Consulting Physician (Oncology) [name], MD as Attending Physician (Radiation Oncology) [name], MD as Consulting Physician (General Surgery) 03/15/2020  CHIEF COMPLAINT: F/u ER+ right breast cancer   CURRENT THERAPY: starting adjuvant Herceptin and perjeta 03/15/20, adjuvant RT  INTERVAL HISTORY: Paige Gilbert returns for f/u and treatment as scheduled. She was last seen by Dr. Jana Hakim to review surgical path on 02/28/20 and was found to have HER2+ disease. The port was removed during surgery. She hopes to avoid needing it back in. Echo on 8/13 shows EF 60-65%.  She tried to do the CT some on 8/18 but could not raise her arm high enough due to right chest wall pain.  She still has 50 cc daily from her JP drain.  She is scheduled to start PT tomorrow.   She is otherwise doing well.  She has some soreness in the right chest wall, pain level fluctuates but not constant.  Not requiring pain medication.  She is out of bed and active at home, eating and drinking well.  Her nails are starting to regrow.  Denies nausea, vomiting, constipation, diarrhea, fever, chills, cough, shortness of breath, neuropathy.   MEDICAL HISTORY:  Past Medical History:  Diagnosis Date  . Anemia   . right breast ca dx'd 06/2019   chemo  . Varicose vein   . Varicose veins of left lower extremity    "bluging ones at top- no pain"    SURGICAL HISTORY: Past Surgical History:  Procedure Laterality Date  . MASTECTOMY MODIFIED RADICAL Right 02/17/2020   Procedure: RIGHT MASTECTOMY MODIFIED RADICAL;  Surgeon: [name], MD;  Location: [place];  Service: General;  Laterality: Right;  . NO PAST SURGERIES    .  none    . PORT-A-CATH REMOVAL Right 02/17/2020   Procedure: REMOVAL PORT-A-CATH;  Surgeon: [name], MD;  Location: [place];  Service: General;  Laterality: Right;  . PORTACATH PLACEMENT Right 08/17/2019   Procedure: INSERTION PORT-A-CATH WITH ULTRASOUND;  Surgeon: [name], MD;  Location: [place];  Service: General;  Laterality: Right;  . wisdom teeth removal     remotely.     I have reviewed the social history and family history with the patient and they are unchanged from previous note.  ALLERGIES:  has No Known Allergies.  MEDICATIONS:  Current Outpatient Medications  Medication Sig Dispense Refill  . Calcium Citrate-Vitamin D (CALCIUM CITRATE + D PO) Take 1 tablet by mouth 3 (three) times a week.     Marland Kitchen ibuprofen (ADVIL) 800 MG tablet Take 1 tablet (800 mg total) by mouth every 8 (eight) hours as needed. 30 tablet 0  . ibuprofen (ADVIL) 800 MG tablet Take 1 tablet (800 mg total) by mouth every 8 (eight) hours as needed. (Patient not taking: Reported on 03/13/2020) 30 tablet 0  . lidocaine-prilocaine (EMLA) cream Apply to affected area once (Patient not taking: Reported on 02/09/2020) 30 g 3  . LORazepam (ATIVAN) 0.5 MG tablet Take 1 tablet (0.5 mg total) by mouth at bedtime as needed (Nausea or vomiting). (Patient not taking: Reported on 02/09/2020) 30 tablet 0  . naproxen sodium (ALEVE) 220 MG tablet Take 220 mg by mouth daily as needed (pain). (Patient  not taking: Reported on 03/13/2020)    . oxyCODONE (OXY IR/ROXICODONE) 5 MG immediate release tablet Take 1 tablet (5 mg total) by mouth every 6 (six) hours as needed for severe pain. (Patient not taking: Reported on 03/13/2020) 15 tablet 0  . prochlorperazine (COMPAZINE) 10 MG tablet Take 1 tablet (10 mg total) by mouth every 6 (six) hours as needed (Nausea or vomiting). (Patient not taking: Reported on 02/09/2020) 30 tablet 1   No current facility-administered medications for this visit.   Facility-Administered Medications Ordered  in Other Visits  Medication Dose Route Frequency Provider Last Rate Last Admin  . pertuzumab (PERJETA) 840 mg in sodium chloride 0.9 % 250 mL chemo infusion  840 mg Intravenous Once Magrinat, [name], MD      . Theotis Burrow [facility]) 504 mg in sodium chloride 0.9 % 250 mL chemo infusion  8 mg/kg (Treatment Plan Recorded) Intravenous Once Magrinat, [name], MD        PHYSICAL EXAMINATION: ECOG PERFORMANCE STATUS: 0 - Asymptomatic  Vitals:   03/15/20 1147  BP: (!) 114/54  Pulse: 69  Resp: 18  Temp: 97.8 F (36.6 C)  SpO2: 99%   Filed Weights   03/15/20 1147  Weight: 139 lb 9.6 oz (63.3 kg)    GENERAL:alert, no distress and comfortable SKIN: No rash to exposed skin EYES:  sclera clear LUNGS: clear with normal breathing effort HEART: regular rate & rhythm, no lower extremity edema Musculoskeletal: Right arm to 90 degrees NEURO: alert & oriented x 3 with fluent speech S/p right mastectomy, incision closed without erythema or drainage, healing well.  Right chest wall drainage tube sutured in place with mild erythema at the opening.  PAC removed   LABORATORY DATA:  I have reviewed the data as listed CBC Latest Ref Rng & Units 03/15/2020 02/28/2020 02/18/2020  WBC 4.0 - 10.5 K/uL 4.7 5.2 6.4  Hemoglobin 12.0 - 15.0 g/dL 12.7 13.0 11.0(L)  Hematocrit 36 - 46 % 37.6 38.7 33.1(L)  Platelets 150 - 400 K/uL 151 171 157     CMP Latest Ref Rng & Units 03/15/2020 02/28/2020 02/18/2020  Glucose 70 - 99 mg/dL 99 80 375(H)  BUN 8 - 23 mg/dL _0 Creatinine 0.44 - 1.00 mg/dL 0.73 0.80 0.62  Sodium 135 - 145 mmol/L 139 140 137  Potassium 3.5 - 5.1 mmol/L 4.1 4.5 3.8  Chloride 98 - 111 mmol/L 109 107 109  CO2 22 - 32 mmol/L _1 Calcium 8.9 - 10.3 mg/dL 9.3 9.8 8.0(L)  Total Protein 6.5 - 8.1 g/dL 6.2(L) 6.5 -  Total Bilirubin 0.3 - 1.2 mg/dL 0.3 0.3 -  Alkaline Phos 38 - 126 U/L 72 65 -  AST 15 - 41 U/L 22 18 -  ALT 0 - 44 U/L 29 23 -      RADIOGRAPHIC STUDIES: I  have personally reviewed the radiological images as listed and agreed with the findings in the report. No results found.   ASSESSMENT & PLAN: 64 y.o. woman   1.  Malignant neoplasm of the upper outer quadrant of right breast -Diagnosed 07/13/2019 s/p right breast biopsy x2; invasive ductal carcinoma, grade 2, ER positive, PR variable, HER-2 not amplified cT3-4,N1-2 clinical stage III -breast MRI 08/15/2019 showed a 7.6 area of enhancement, and 5 suspicious right axillary lymph nodes as well as two 0.5 mm additional masses in the inferior right breast  -Staging CT/bone scan on 07/2019 showed no evidence of distant metastasis except a single focus  of activity at T9.  Thoracic spine MRI on 09/13/2019 showed degenerative changes no evidence of metastatic disease -Baseline echo on 08/10/2019 LVEF 60-65% -S/p neoadjuvant chemo with ddAC x4 followed by weekly Taxol x12, 08/18/2019-01/11/2020 -S/p right modified radical mastectomy 02/17/2020 for a residual T3 N2 invasive ductal carcinoma, with negative margins, estrogen and progesterone receptor positive, and now also HER-2 positive with a ratio of 2.98, number per cell 7.08.  Port-A-Cath removed and surgery -To reduce risk of local recurrence she will proceed with adjuvant radiation per Dr. Isidore Moos once her right arm mobility improves and she can position for CT Sim  -Due to her ER positive disease she is a candidate for antiestrogen therapy after she completes adjuvant radiation -Echo 03/10/2020 shows LVEF 60-65% -Beginning adjuvant anti-HER-2 therapy with Herceptin/Perjeta on 03/15/2020 to complete 1 year   Disposition: Ms. Arshad appears stable.  She continues to recover well from right mastectomy.  Drainage tube still has moderate output, 50 cc daily.  She has some range of motion limitation and discomfort which has impeded the start of adjuvant radiation.  She plans to start physical therapy on 03/16/2020.  The plan is to reattempt CT SIM next week with an  anticipated start date of 8/30.  I reviewed the CBC and CMP from today, I discussed her repeat echo shows a normal ejection fraction.  She will proceed with cycle 1 adjuvant Herceptin (Kanjinti) and Perjeta today.  We again reviewed potential side effects and symptom management.  She will return for follow-up and cycle 2 in 3 weeks.  All questions were answered. The patient knows to call the clinic with any problems, questions or concerns. No barriers to learning was detected.     Alla Feeling, NP 03/15/20

## 2020-03-16 ENCOUNTER — Encounter: Payer: Self-pay | Admitting: Physical Therapy

## 2020-03-16 ENCOUNTER — Telehealth: Payer: Self-pay | Admitting: Licensed Clinical Social Worker

## 2020-03-16 ENCOUNTER — Ambulatory Visit: Payer: Managed Care, Other (non HMO) | Attending: Surgery | Admitting: Physical Therapy

## 2020-03-16 ENCOUNTER — Telehealth: Payer: Self-pay | Admitting: Nurse Practitioner

## 2020-03-16 DIAGNOSIS — R293 Abnormal posture: Secondary | ICD-10-CM | POA: Diagnosis present

## 2020-03-16 DIAGNOSIS — Z170 Estrogen receptor positive status [ER+]: Secondary | ICD-10-CM | POA: Diagnosis present

## 2020-03-16 DIAGNOSIS — C50911 Malignant neoplasm of unspecified site of right female breast: Secondary | ICD-10-CM | POA: Diagnosis present

## 2020-03-16 DIAGNOSIS — M25611 Stiffness of right shoulder, not elsewhere classified: Secondary | ICD-10-CM | POA: Diagnosis not present

## 2020-03-16 NOTE — Therapy (Signed)
[place], [place], [address] Phone: [phone]   Fax:  [phone]  Physical Therapy Treatment  Patient Details  Name: Paige Gilbert MRN: [id number] Date of Birth: 1956/03/02 Referring Provider (PT): Cornett   Encounter Date: 03/16/2020   PT End of Session - 03/16/20 1537    Visit Number 2    Number of Visits 10    Date for PT Re-Evaluation 04/20/20    PT Start Time 1501    PT Stop Time 1536    PT Time Calculation (min) 35 min    Activity Tolerance Patient tolerated treatment well;Treatment limited secondary to medical complications (Comment)   pt had drains in place so unable to do treatment   Behavior During Therapy [facility] for tasks assessed/performed           Past Medical History:  Diagnosis Date  . Anemia   . right breast ca dx'd 06/2019   chemo  . Varicose vein   . Varicose veins of left lower extremity    "bluging ones at top- no pain"    Past Surgical History:  Procedure Laterality Date  . MASTECTOMY MODIFIED RADICAL Right 02/17/2020   Procedure: RIGHT MASTECTOMY MODIFIED RADICAL;  Surgeon: [name], MD;  Location: [place];  Service: General;  Laterality: Right;  . NO PAST SURGERIES    . none    . PORT-A-CATH REMOVAL Right 02/17/2020   Procedure: REMOVAL PORT-A-CATH;  Surgeon: [name], MD;  Location: [place];  Service: General;  Laterality: Right;  . PORTACATH PLACEMENT Right 08/17/2019   Procedure: INSERTION PORT-A-CATH WITH ULTRASOUND;  Surgeon: [name], MD;  Location: [place];  Service: General;  Laterality: Right;  . wisdom teeth removal     remotely.     There were no vitals filed for this visit.   Subjective Assessment - 03/16/20 1507    Subjective I started immunotherapy yesterday. I am having trouble with my range of motion. I still have my drain in.    Pertinent History R breast cancer, completed chemotherapy, DCIS grade 2-3, ER+ PR+    Patient  Stated Goals reduce lymphedema risk and learn post op exercises    Currently in Pain? No/denies    Pain Score 0-No pain              [facility] PT Assessment - 03/16/20 0001      AROM   Right Shoulder Flexion 110 Degrees    Right Shoulder ABduction 112 Degrees    Right Shoulder Internal Rotation 60 Degrees    Right Shoulder External Rotation 76 Degrees             LYMPHEDEMA/ONCOLOGY QUESTIONNAIRE - 03/16/20 0001      Right Upper Extremity Lymphedema   15 cm Proximal to Olecranon Process 26.5 cm    Olecranon Process 23.8 cm    15 cm Proximal to Ulnar Styloid Process 22.6 cm    Just Proximal to Ulnar Styloid Process 16 cm    Across Hand at [employer] 19.1 cm    At [place] of 2nd Digit 5.6 cm                                   PT Long Term Goals - 03/16/20 1543      PT LONG TERM GOAL #1   Title Pt will demonstrate she has regained full shoulder ROM and function post operatively  compared to baselines.    Time 8    Period Weeks    Status On-going      PT LONG TERM GOAL #2   Title Pt will demonstrate 165 degrees of left shoulder flexion to allow her to reach overhad.    Baseline 110    Time 5    Period Weeks    Status New    Target Date 04/20/20      PT LONG TERM GOAL #3   Title Pt will demonstrate 165 degrees of right shoulder abduction to allow her to reach out to the side.    Baseline 112    Time 5    Period Weeks    Status New    Target Date 04/20/20      PT LONG TERM GOAL #4   Title Pt will report a 75% improvement in pain and tightness across right chest when turning head to the left to allow improved comfort.    Time 5    Period Weeks    Status New    Target Date 04/20/20      PT LONG TERM GOAL #5   Title Pt will be independent in a home exercise program for continued strengthening and stretching    Time 5    Period Weeks    Status New    Target Date 04/20/20                 Plan - 03/16/20 1537    Clinical  Impression Statement Pt returns to PT for post op follow up. Reassessed pt's ROM which has decreased signficantly in her RUE. She had an appointment with radiation to undergo simulation and was unable due to limited ROM. Educated pt that she should not raise her arm above 90 degrees until her drains have been removed. Will reschedule pt to be seen in a week pending drain removal. Sent inbasket message to Dr. Brantley Stage to see what he would like to do since pt has an appointment to get her drain removed on the 23th and radiation simulation on the 25th which does not allow any time for therapy to improve shoulder ROM. Circumferential measurements taken today do no demonstate any increase in swelling. Will add new goals today to address decreased ROM and pec tightness.    Rehab Potential Good    PT Frequency 2x / week    PT Duration --   5 weeks   PT Treatment/Interventions ADLs/Self Care Home Management;Therapeutic exercise;Patient/family education;Therapeutic activities;Manual techniques;Manual lymph drainage;Compression bandaging;Scar mobilization;Passive range of motion;Taping    PT Next Visit Plan begin ROM of R shoulder 1 week after drain removal unless instructed otherwise by Dr. Brantley Stage, manual therapy to R pec muscle to decrease tightness    Consulted and Agree with Plan of Care Patient           Patient will benefit from skilled therapeutic intervention in order to improve the following deficits and impairments:  Decreased knowledge of precautions, Postural dysfunction, Impaired UE functional use, Pain, Increased fascial restricitons, Decreased strength, Decreased range of motion, Decreased scar mobility  Visit Diagnosis: Stiffness of right shoulder, not elsewhere classified  Abnormal posture  Malignant neoplasm of right breast in female, estrogen receptor positive, unspecified site of breast [facility])     Problem List Patient Active Problem List   Diagnosis Date Noted  . Right breast  cancer with T3 tumor, >5 cm in greatest dimension ([place]) 02/17/2020  . Malignant neoplasm of upper-outer quadrant of right breast  in female, estrogen receptor positive ([place]) 08/03/2019  . Plantar fasciitis, bilateral 02/15/2015  . Lumbago 02/15/2015  . Screening 02/15/2015  . Hemisensory loss 06/11/2013  . Weakness 06/11/2013    Allyson Sabal [facility] 03/16/2020, 3:47 PM  [place], [place], [address] Phone: [phone]   Fax:  [phone]  Name: Paige Gilbert MRN: [id number] Date of Birth: [birth year]  Manus Gunning, PT 03/16/20 3:47 PM

## 2020-03-16 NOTE — Telephone Encounter (Signed)
[place] Clinical Social Work  CSW left 2nd VM in attempt to contact patient after positive distress screen.  Patient may be re-referred as needed to support services.   Edwinna Areola [employer] [employer], LCSW

## 2020-03-16 NOTE — Telephone Encounter (Signed)
Scheduled as is. Pt is already scheduled in 6 weeks per 8/18 los.

## 2020-03-17 ENCOUNTER — Telehealth: Payer: Self-pay | Admitting: *Deleted

## 2020-03-17 NOTE — Telephone Encounter (Signed)
Called pt to see how she did with her treatment earlier this week.  She reports having some chills when she got home but got ok & yesterday was ok but had h/a last hs.  She has slept well & denies any other problems.  She was concerned about still having drain in & couldn't raise her arm high enough for simulation.  She is to return on Wed & states that drain is to come out hopefully Monday.  Informed to call radiation onc if she is still having problems with drain/arm. She expressed understanding & knows how to reach [place] if she has concerns.

## 2020-03-17 NOTE — Telephone Encounter (Signed)
-----   Message from Priscille Loveless, RN sent at 03/15/2020  3:38 PM EDT ----- Regarding: First chemo f/u Magrinat First trastuzumab/ pertuzumab

## 2020-03-22 ENCOUNTER — Ambulatory Visit: Payer: Managed Care, Other (non HMO) | Admitting: Radiation Oncology

## 2020-03-27 ENCOUNTER — Telehealth: Payer: Self-pay | Admitting: Adult Health

## 2020-03-27 ENCOUNTER — Other Ambulatory Visit: Payer: Self-pay

## 2020-03-27 ENCOUNTER — Ambulatory Visit
Admission: RE | Admit: 2020-03-27 | Discharge: 2020-03-27 | Disposition: A | Discharge status: Home / Self Care | Payer: Managed Care, Other (non HMO) | Source: Ambulatory Visit | Attending: Radiation Oncology | Admitting: Radiation Oncology

## 2020-03-27 ENCOUNTER — Ambulatory Visit: Payer: Managed Care, Other (non HMO) | Admitting: Radiation Oncology

## 2020-03-27 DIAGNOSIS — C50411 Malignant neoplasm of upper-outer quadrant of right female breast: Secondary | ICD-10-CM | POA: Diagnosis not present

## 2020-03-27 NOTE — Telephone Encounter (Signed)
Cancelled office visit per [facility] request. Moved labs closer to infusion appt to avoid long wait for patient. Pt confirmed new arrival time.

## 2020-03-28 ENCOUNTER — Ambulatory Visit: Payer: Managed Care, Other (non HMO)

## 2020-03-29 ENCOUNTER — Ambulatory Visit: Payer: Managed Care, Other (non HMO)

## 2020-03-29 DIAGNOSIS — Z510 Encounter for antineoplastic radiation therapy: Secondary | ICD-10-CM | POA: Insufficient documentation

## 2020-03-29 DIAGNOSIS — Z5112 Encounter for antineoplastic immunotherapy: Secondary | ICD-10-CM | POA: Diagnosis present

## 2020-03-29 DIAGNOSIS — R293 Abnormal posture: Secondary | ICD-10-CM | POA: Diagnosis not present

## 2020-03-29 DIAGNOSIS — Z23 Encounter for immunization: Secondary | ICD-10-CM | POA: Diagnosis not present

## 2020-03-29 DIAGNOSIS — C50411 Malignant neoplasm of upper-outer quadrant of right female breast: Secondary | ICD-10-CM | POA: Insufficient documentation

## 2020-03-29 DIAGNOSIS — M25611 Stiffness of right shoulder, not elsewhere classified: Secondary | ICD-10-CM | POA: Diagnosis not present

## 2020-03-29 DIAGNOSIS — Z170 Estrogen receptor positive status [ER+]: Secondary | ICD-10-CM | POA: Insufficient documentation

## 2020-03-30 ENCOUNTER — Other Ambulatory Visit: Payer: Self-pay

## 2020-03-30 ENCOUNTER — Ambulatory Visit: Payer: Managed Care, Other (non HMO) | Admitting: Radiation Oncology

## 2020-03-30 ENCOUNTER — Ambulatory Visit: Payer: Managed Care, Other (non HMO) | Admitting: Physical Therapy

## 2020-03-30 DIAGNOSIS — M25611 Stiffness of right shoulder, not elsewhere classified: Secondary | ICD-10-CM

## 2020-03-30 DIAGNOSIS — Z170 Estrogen receptor positive status [ER+]: Secondary | ICD-10-CM | POA: Insufficient documentation

## 2020-03-30 DIAGNOSIS — R293 Abnormal posture: Secondary | ICD-10-CM

## 2020-03-30 DIAGNOSIS — C50911 Malignant neoplasm of unspecified site of right female breast: Secondary | ICD-10-CM | POA: Insufficient documentation

## 2020-03-30 DIAGNOSIS — Z510 Encounter for antineoplastic radiation therapy: Secondary | ICD-10-CM | POA: Diagnosis not present

## 2020-03-30 NOTE — Therapy (Signed)
[place], [place], [address] Phone: [phone]   Fax:  [phone]  Physical Therapy Treatment  Patient Details  Name: Paige Gilbert MRN: [id number] Date of Birth: June 06, 1956 Referring Provider (PT): Cornett   Encounter Date: 03/30/2020   PT End of Session - 03/30/20 1711    Visit Number 3    Number of Visits 10    Date for PT Re-Evaluation 04/20/20    PT Start Time 1505    PT Stop Time 1555    PT Time Calculation (min) 50 min    Activity Tolerance Patient tolerated treatment well    Behavior During Therapy [facility] for tasks assessed/performed           Past Medical History:  Diagnosis Date  . Anemia   . right breast ca dx'd 06/2019   chemo  . Varicose vein   . Varicose veins of left lower extremity    "bluging ones at top- no pain"    Past Surgical History:  Procedure Laterality Date  . MASTECTOMY MODIFIED RADICAL Right 02/17/2020   Procedure: RIGHT MASTECTOMY MODIFIED RADICAL;  Surgeon: [name], MD;  Location: [place];  Service: General;  Laterality: Right;  . NO PAST SURGERIES    . none    . PORT-A-CATH REMOVAL Right 02/17/2020   Procedure: REMOVAL PORT-A-CATH;  Surgeon: [name], MD;  Location: [place];  Service: General;  Laterality: Right;  . PORTACATH PLACEMENT Right 08/17/2019   Procedure: INSERTION PORT-A-CATH WITH ULTRASOUND;  Surgeon: [name], MD;  Location: [place];  Service: General;  Laterality: Right;  . wisdom teeth removal     remotely.     There were no vitals filed for this visit.   Subjective Assessment - 03/30/20 1511    Subjective I had my radiaiton sim this week and will start radiation Sept 7 My arm doesn't hurt unless I raise it up,  She has been doing exercises at home and describes dowel and other stretches.  Pt had cellulitis in her chest that has since resolved and she completed her antibiotic    Pertinent History R breast cancer, completed  chemotherapy, DCIS grade 2-3, ER+ PR+  pt with right mastectomy 02/16/2200 with 7 of  9 nodes positive .  Pt had drain in about 4 weeks and developed a cellulitis.  Pt will receive herceptin infusions and radiation    Patient Stated Goals reduce lymphedema risk and learn post op exercises    Currently in Pain? Yes   pulling and tightnes when elevating her arm , none with arms at her side   Pain Score --   did not rate   Pain Location Axilla    Pain Orientation Right    Pain Descriptors / Indicators Tightness    Pain Type Surgical pain    Pain Radiating Towards down right arm    Pain Onset More than a month ago              [facility] PT Assessment - 03/30/20 0001      AROM   Right Shoulder Flexion 130 Degrees    Right Shoulder ABduction 145 Degrees   prominent cord in axilla with tightness    Right Shoulder Internal Rotation 60 Degrees    Right Shoulder External Rotation 90 Degrees                         [facility] Adult PT Treatment/Exercise - 03/30/20 0001  Exercises   Exercises Shoulder      Shoulder Exercises: Sidelying   ABduction AROM;Right;5 reps    Other Sidelying Exercises small circles with hand pointed to ceiling       Manual Therapy   Manual Therapy Edema management;Myofascial release;Manual Lymphatic Drainage (MLD);Passive ROM    Manual therapy comments provided small tg soft to right arm to try to help decrease cording     Edema Management gentle scar massage to port scar on right chest     Myofascial Release to right forearm     Manual Lymphatic Drainage (MLD) short neck, diphragmatic breaths. right shoulder,  upper   lower arm with return alon pathways then to sidelying for back and right lateral trunk     Passive ROM to right shoulder in extremes of flexion, abduction and external rotatio                        PT Long Term Goals - 03/16/20 1543      PT LONG TERM GOAL #1   Title Pt will demonstrate she has regained full shoulder  ROM and function post operatively compared to baselines.    Time 8    Period Weeks    Status On-going      PT LONG TERM GOAL #2   Title Pt will demonstrate 165 degrees of left shoulder flexion to allow her to reach overhad.    Baseline 110    Time 5    Period Weeks    Status New    Target Date 04/20/20      PT LONG TERM GOAL #3   Title Pt will demonstrate 165 degrees of right shoulder abduction to allow her to reach out to the side.    Baseline 112    Time 5    Period Weeks    Status New    Target Date 04/20/20      PT LONG TERM GOAL #4   Title Pt will report a 75% improvement in pain and tightness across right chest when turning head to the left to allow improved comfort.    Time 5    Period Weeks    Status New    Target Date 04/20/20      PT LONG TERM GOAL #5   Title Pt will be independent in a home exercise program for continued strengthening and stretching    Time 5    Period Weeks    Status New    Target Date 04/20/20                 Plan - 03/30/20 1712    Clinical Impression Statement Pt has improved, but still limited, right shoulder ROM and was able to complete her radiation sim, but has significanet cording in right axilla down into forearm.  Begain work on this today with reinforcement of exercises and provided tg soft to see if she could get symptomatic relief and allow more stretching.  Pt wiht significnat congestion in axilla and at lateral incision    Personal Factors and Comorbidities Comorbidity 3+    Comorbidities chemo, surgery , 9 nodes removed, cellulitis    Stability/Clinical Decision Making Stable/Uncomplicated    Rehab Potential Good    PT Frequency 2x / week    PT Treatment/Interventions ADLs/Self Care Home Management;Therapeutic exercise;Patient/family education;Therapeutic activities;Manual techniques;Manual lymph drainage;Compression bandaging;Scar mobilization;Passive range of motion;Taping    PT Next Visit Plan check to see if tg soft  helped,  continue with MLD and myofascial release to cording PROM to shoulder. and progress to pulley, wall stretches , prograction stretching as able    PT Home Exercise Plan post op breast exercises    Consulted and Agree with Plan of Care Patient           Patient will benefit from skilled therapeutic intervention in order to improve the following deficits and impairments:  Decreased knowledge of precautions, Postural dysfunction, Impaired UE functional use, Pain, Increased fascial restricitons, Decreased strength, Decreased range of motion, Decreased scar mobility  Visit Diagnosis: Stiffness of right shoulder, not elsewhere classified  Abnormal posture     Problem List Patient Active Problem List   Diagnosis Date Noted  . Right breast cancer with T3 tumor, >5 cm in greatest dimension ([place]) 02/17/2020  . Malignant neoplasm of upper-outer quadrant of right breast in female, estrogen receptor positive ([place]) 08/03/2019  . Plantar fasciitis, bilateral 02/15/2015  . Lumbago 02/15/2015  . Screening 02/15/2015  . Hemisensory loss 06/11/2013  . Weakness 06/11/2013   Donato Heinz. Owens Shark PT  Norwood Levo 03/30/2020, 5:16 PM  [place], [place], [address] Phone: [phone]   Fax:  [phone]  Name: Paige Gilbert MRN: [id number] Date of Birth: April 06, 1956

## 2020-03-31 ENCOUNTER — Ambulatory Visit: Payer: Managed Care, Other (non HMO)

## 2020-04-04 ENCOUNTER — Other Ambulatory Visit: Payer: Self-pay

## 2020-04-04 ENCOUNTER — Ambulatory Visit
Admission: RE | Admit: 2020-04-04 | Discharge: 2020-04-04 | Disposition: A | Discharge status: Home / Self Care | Payer: Managed Care, Other (non HMO) | Source: Ambulatory Visit | Attending: Radiation Oncology | Admitting: Radiation Oncology

## 2020-04-04 ENCOUNTER — Encounter: Payer: Self-pay | Admitting: Rehabilitation

## 2020-04-04 ENCOUNTER — Ambulatory Visit: Payer: Managed Care, Other (non HMO) | Admitting: Rehabilitation

## 2020-04-04 DIAGNOSIS — R293 Abnormal posture: Secondary | ICD-10-CM

## 2020-04-04 DIAGNOSIS — M25611 Stiffness of right shoulder, not elsewhere classified: Secondary | ICD-10-CM

## 2020-04-04 DIAGNOSIS — C50911 Malignant neoplasm of unspecified site of right female breast: Secondary | ICD-10-CM

## 2020-04-04 DIAGNOSIS — Z510 Encounter for antineoplastic radiation therapy: Secondary | ICD-10-CM | POA: Diagnosis not present

## 2020-04-04 NOTE — Therapy (Signed)
[place], [place], [address] Phone: [phone]   Fax:  [phone]  Physical Therapy Treatment  Patient Details  Name: Paige Gilbert MRN: [id number] Date of Birth: 26-Jul-1956 Referring Provider (PT): Cornett   Encounter Date: 04/04/2020   PT End of Session - 04/04/20 1555    Visit Number 4    Number of Visits 10    Date for PT Re-Evaluation 04/20/20    PT Start Time 1500    PT Stop Time 1550    PT Time Calculation (min) 50 min    Activity Tolerance Patient tolerated treatment well    Behavior During Therapy [facility] for tasks assessed/performed           Past Medical History:  Diagnosis Date  . Anemia   . right breast ca dx'd 06/2019   chemo  . Varicose vein   . Varicose veins of left lower extremity    "bluging ones at top- no pain"    Past Surgical History:  Procedure Laterality Date  . MASTECTOMY MODIFIED RADICAL Right 02/17/2020   Procedure: RIGHT MASTECTOMY MODIFIED RADICAL;  Surgeon: [name], MD;  Location: [facility];  Service: General;  Laterality: Right;  . NO PAST SURGERIES    . none    . PORT-A-CATH REMOVAL Right 02/17/2020   Procedure: REMOVAL PORT-A-CATH;  Surgeon: [name], MD;  Location: [facility];  Service: General;  Laterality: Right;  . PORTACATH PLACEMENT Right 08/17/2019   Procedure: INSERTION PORT-A-CATH WITH ULTRASOUND;  Surgeon: [name], MD;  Location: [facility];  Service: General;  Laterality: Right;  . wisdom teeth removal     remotely.     There were no vitals filed for this visit.   Subjective Assessment - 04/04/20 1501    Subjective I had my first radiation today. I have infusion tomorrow    Pertinent History R breast cancer, completed chemotherapy, DCIS grade 2-3, ER+ PR+  pt with right mastectomy 02/16/2200 with 7 of  9 nodes positive .  Pt had drain in about 4 weeks and developed a cellulitis.  Pt will receive herceptin infusions and radiation     Patient Stated Goals reduce lymphedema risk and learn post op exercises    Currently in Pain? No/denies              [facility] PT Assessment - 04/04/20 0001      AROM   Right Shoulder Flexion 135 Degrees   pull in the upper arm    Right Shoulder ABduction 145 Degrees   cording evident                        [facility] Adult PT Treatment/Exercise - 04/04/20 0001      Shoulder Exercises: Seated   Other Seated Exercises retraction/posterior shoulder rolls x 6    Other Seated Exercises Y/W x 6      Shoulder Exercises: Pulleys   Flexion 2 minutes    Flexion Limitations with cueing for intiial performance     ABduction 2 minutes    ABduction Limitations with cueing for initial performance      Shoulder Exercises: Stretch   Corner Stretch 2 reps;20 seconds    Corner Stretch Limitations doorway both arms      Manual Therapy   Myofascial Release gently to axilla with blocking during PROM and over proximal cords into upper arm    Manual Lymphatic Drainage (MLD) short neck, right shoulder and axillary nodes,  Rt inguinal nodes, lateral trunk clearance and then upper arm with return along pathways    Passive ROM to right shoulder to tolerance flexion, abduction and external rotation                       PT Long Term Goals - 03/16/20 1543      PT LONG TERM GOAL #1   Title Pt will demonstrate she has regained full shoulder ROM and function post operatively compared to baselines.    Time 8    Period Weeks    Status On-going      PT LONG TERM GOAL #2   Title Pt will demonstrate 165 degrees of left shoulder flexion to allow her to reach overhad.    Baseline 110    Time 5    Period Weeks    Status New    Target Date 04/20/20      PT LONG TERM GOAL #3   Title Pt will demonstrate 165 degrees of right shoulder abduction to allow her to reach out to the side.    Baseline 112    Time 5    Period Weeks    Status New    Target Date 04/20/20      PT LONG TERM  GOAL #4   Title Pt will report a 75% improvement in pain and tightness across right chest when turning head to the left to allow improved comfort.    Time 5    Period Weeks    Status New    Target Date 04/20/20      PT LONG TERM GOAL #5   Title Pt will be independent in a home exercise program for continued strengthening and stretching    Time 5    Period Weeks    Status New    Target Date 04/20/20                 Plan - 04/04/20 1556    Clinical Impression Statement Started pulleys today which was tolerated well as well as more stretches and postural movements.  Pt continues with cording and pull in the Rt axilla and decreased PROM/AROM and tightness of the Rt chest wall.    PT Frequency 2x / week    PT Treatment/Interventions ADLs/Self Care Home Management;Therapeutic exercise;Patient/family education;Therapeutic activities;Manual techniques;Manual lymph drainage;Compression bandaging;Scar mobilization;Passive range of motion;Taping    PT Next Visit Plan continue with MLD and myofascial release to cording PROM to Rtshoulder. pulleys and wall ball    Consulted and Agree with Plan of Care Patient           Patient will benefit from skilled therapeutic intervention in order to improve the following deficits and impairments:     Visit Diagnosis: Stiffness of right shoulder, not elsewhere classified  Abnormal posture  Malignant neoplasm of right breast in female, estrogen receptor positive, unspecified site of breast [facility])     Problem List Patient Active Problem List   Diagnosis Date Noted  . Right breast cancer with T3 tumor, >5 cm in greatest dimension ([place]) 02/17/2020  . Malignant neoplasm of upper-outer quadrant of right breast in female, estrogen receptor positive ([place]) 08/03/2019  . Plantar fasciitis, bilateral 02/15/2015  . Lumbago 02/15/2015  . Screening 02/15/2015  . Hemisensory loss 06/11/2013  . Weakness 06/11/2013    Stark Bray 04/04/2020, 3:59  PM  [place], [place], [address] Phone: [phone]   Fax:  [phone]  Name: Lilyann Gravelle MRN: [id number] Date of Birth: 1956-04-27

## 2020-04-05 ENCOUNTER — Other Ambulatory Visit: Payer: Managed Care, Other (non HMO)

## 2020-04-05 ENCOUNTER — Inpatient Hospital Stay: Payer: Managed Care, Other (non HMO)

## 2020-04-05 ENCOUNTER — Ambulatory Visit
Admission: RE | Admit: 2020-04-05 | Discharge: 2020-04-05 | Disposition: A | Discharge status: Home / Self Care | Payer: Managed Care, Other (non HMO) | Source: Ambulatory Visit | Attending: Radiation Oncology | Admitting: Radiation Oncology

## 2020-04-05 ENCOUNTER — Other Ambulatory Visit: Payer: Self-pay

## 2020-04-05 ENCOUNTER — Ambulatory Visit: Payer: Managed Care, Other (non HMO) | Admitting: Adult Health

## 2020-04-05 ENCOUNTER — Inpatient Hospital Stay: Payer: Managed Care, Other (non HMO) | Attending: Oncology

## 2020-04-05 ENCOUNTER — Telehealth: Payer: Self-pay | Admitting: Oncology

## 2020-04-05 VITALS — BP 107/67 | HR 64 | Temp 98.0°F | Resp 18

## 2020-04-05 DIAGNOSIS — C50911 Malignant neoplasm of unspecified site of right female breast: Secondary | ICD-10-CM

## 2020-04-05 DIAGNOSIS — C50411 Malignant neoplasm of upper-outer quadrant of right female breast: Secondary | ICD-10-CM

## 2020-04-05 DIAGNOSIS — Z510 Encounter for antineoplastic radiation therapy: Secondary | ICD-10-CM | POA: Diagnosis not present

## 2020-04-05 DIAGNOSIS — Z170 Estrogen receptor positive status [ER+]: Secondary | ICD-10-CM

## 2020-04-05 LAB — CBC WITH DIFFERENTIAL/PLATELET
Abs Immature Granulocytes: 0.02 10*3/uL (ref 0.00–0.07)
Basophils Absolute: 0.1 10*3/uL (ref 0.0–0.1)
Basophils Relative: 1 %
Eosinophils Absolute: 0.1 10*3/uL (ref 0.0–0.5)
Eosinophils Relative: 2 %
HCT: 38.8 % (ref 36.0–46.0)
Hemoglobin: 13.1 g/dL (ref 12.0–15.0)
Immature Granulocytes: 0 %
Lymphocytes Relative: 32 %
Lymphs Abs: 1.5 10*3/uL (ref 0.7–4.0)
MCH: 28.9 pg (ref 26.0–34.0)
MCHC: 33.8 g/dL (ref 30.0–36.0)
MCV: 85.7 fL (ref 80.0–100.0)
Monocytes Absolute: 0.3 10*3/uL (ref 0.1–1.0)
Monocytes Relative: 6 %
Neutro Abs: 2.7 10*3/uL (ref 1.7–7.7)
Neutrophils Relative %: 59 %
Platelets: 182 10*3/uL (ref 150–400)
RBC: 4.53 MIL/uL (ref 3.87–5.11)
RDW: 12.2 % (ref 11.5–15.5)
WBC: 4.5 10*3/uL (ref 4.0–10.5)
nRBC: 0 % (ref 0.0–0.2)

## 2020-04-05 LAB — COMPREHENSIVE METABOLIC PANEL
ALT: 40 U/L (ref 0–44)
AST: 26 U/L (ref 15–41)
Albumin: 3.6 g/dL (ref 3.5–5.0)
Alkaline Phosphatase: 85 U/L (ref 38–126)
Anion gap: 9 (ref 5–15)
BUN: 14 mg/dL (ref 8–23)
CO2: 24 mmol/L (ref 22–32)
Calcium: 9.2 mg/dL (ref 8.9–10.3)
Chloride: 107 mmol/L (ref 98–111)
Creatinine, Ser: 0.74 mg/dL (ref 0.44–1.00)
GFR calc Af Amer: >60 mL/min (ref >60)
GFR calc non Af Amer: >60 mL/min (ref >60)
Glucose, Bld: 98 mg/dL (ref 70–99)
Potassium: 3.9 mmol/L (ref 3.5–5.1)
Sodium: 140 mmol/L (ref 135–145)
Total Bilirubin: 0.3 mg/dL (ref 0.3–1.2)
Total Protein: 6.4 g/dL — ABNORMAL LOW (ref 6.5–8.1)

## 2020-04-05 MED ORDER — DIPHENHYDRAMINE HCL 25 MG PO CAPS
25.0000 mg | ORAL_CAPSULE | Freq: Once | ORAL | Status: AC
  Administered 2020-04-05: 25 mg via ORAL

## 2020-04-05 MED ORDER — TRASTUZUMAB-DKST CHEMO 150 MG IV SOLR
6.0000 mg/kg | Freq: Once | INTRAVENOUS | Status: AC
  Administered 2020-04-05: 378 mg via INTRAVENOUS
  Filled 2020-04-05: qty 18

## 2020-04-05 MED ORDER — ALRA NON-METALLIC DEODORANT (RAD-ONC)
1.0000 "application " | Freq: Once | TOPICAL | Status: AC
  Administered 2020-04-05: 1 via TOPICAL

## 2020-04-05 MED ORDER — SODIUM CHLORIDE 0.9 % IV SOLN
Freq: Once | INTRAVENOUS | Status: AC
  Filled 2020-04-05: qty 250

## 2020-04-05 MED ORDER — SODIUM CHLORIDE 0.9 % IV SOLN
420.0000 mg | Freq: Once | INTRAVENOUS | Status: AC
  Administered 2020-04-05: 420 mg via INTRAVENOUS
  Filled 2020-04-05: qty 14

## 2020-04-05 MED ORDER — ACETAMINOPHEN 325 MG PO TABS
650.0000 mg | ORAL_TABLET | Freq: Once | ORAL | Status: AC
  Administered 2020-04-05: 650 mg via ORAL

## 2020-04-05 MED ORDER — DIPHENHYDRAMINE HCL 25 MG PO CAPS
ORAL_CAPSULE | ORAL | Status: AC
  Filled 2020-04-05: qty 1

## 2020-04-05 MED ORDER — RADIAPLEXRX EX GEL: Freq: Once | CUTANEOUS | Status: AC

## 2020-04-05 MED ORDER — ACETAMINOPHEN 325 MG PO TABS
ORAL_TABLET | ORAL | Status: AC
  Filled 2020-04-05: qty 2

## 2020-04-05 NOTE — Telephone Encounter (Signed)
Called pt per 9/8 sch msg - left message for patient with appt date and time

## 2020-04-05 NOTE — Progress Notes (Signed)

## 2020-04-06 ENCOUNTER — Other Ambulatory Visit: Payer: Self-pay

## 2020-04-06 ENCOUNTER — Ambulatory Visit
Admission: RE | Admit: 2020-04-06 | Discharge: 2020-04-06 | Disposition: A | Discharge status: Home / Self Care | Payer: Managed Care, Other (non HMO) | Source: Ambulatory Visit | Attending: Radiation Oncology | Admitting: Radiation Oncology

## 2020-04-06 DIAGNOSIS — Z510 Encounter for antineoplastic radiation therapy: Secondary | ICD-10-CM | POA: Diagnosis not present

## 2020-04-07 ENCOUNTER — Other Ambulatory Visit: Payer: Self-pay

## 2020-04-07 ENCOUNTER — Ambulatory Visit
Admission: RE | Admit: 2020-04-07 | Discharge: 2020-04-07 | Disposition: A | Discharge status: Home / Self Care | Payer: Managed Care, Other (non HMO) | Source: Ambulatory Visit | Attending: Radiation Oncology | Admitting: Radiation Oncology

## 2020-04-07 DIAGNOSIS — Z510 Encounter for antineoplastic radiation therapy: Secondary | ICD-10-CM | POA: Diagnosis not present

## 2020-04-10 ENCOUNTER — Other Ambulatory Visit: Payer: Self-pay

## 2020-04-10 ENCOUNTER — Ambulatory Visit: Payer: Managed Care, Other (non HMO)

## 2020-04-10 ENCOUNTER — Ambulatory Visit
Admission: RE | Admit: 2020-04-10 | Discharge: 2020-04-10 | Disposition: A | Discharge status: Home / Self Care | Payer: Managed Care, Other (non HMO) | Source: Ambulatory Visit | Attending: Radiation Oncology | Admitting: Radiation Oncology

## 2020-04-10 ENCOUNTER — Inpatient Hospital Stay: Payer: Managed Care, Other (non HMO)

## 2020-04-10 DIAGNOSIS — Z23 Encounter for immunization: Secondary | ICD-10-CM

## 2020-04-10 DIAGNOSIS — Z170 Estrogen receptor positive status [ER+]: Secondary | ICD-10-CM

## 2020-04-10 DIAGNOSIS — M25611 Stiffness of right shoulder, not elsewhere classified: Secondary | ICD-10-CM

## 2020-04-10 DIAGNOSIS — Z510 Encounter for antineoplastic radiation therapy: Secondary | ICD-10-CM | POA: Diagnosis not present

## 2020-04-10 DIAGNOSIS — R293 Abnormal posture: Secondary | ICD-10-CM

## 2020-04-10 NOTE — Therapy (Signed)
[place], [place], [address] Phone: [phone]   Fax:  [phone]  Physical Therapy Treatment  Patient Details  Name: Paige Gilbert MRN: [id number] Date of Birth: [birth year] Referring Provider (PT): Cornett   Encounter Date: 04/10/2020   PT End of Session - 04/10/20 1405    Visit Number 5    Number of Visits 10    Date for PT Re-Evaluation 04/20/20    PT Start Time 3212    PT Stop Time 1404    PT Time Calculation (min) 59 min    Activity Tolerance Patient tolerated treatment well    Behavior During Therapy [facility] for tasks assessed/performed           Past Medical History:  Diagnosis Date  . Anemia   . right breast ca dx'd 06/2019   chemo  . Varicose vein   . Varicose veins of left lower extremity    "bluging ones at top- no pain"    Past Surgical History:  Procedure Laterality Date  . MASTECTOMY MODIFIED RADICAL Right 02/17/2020   Procedure: RIGHT MASTECTOMY MODIFIED RADICAL;  Surgeon: [name], MD;  Location: [facility];  Service: General;  Laterality: Right;  . NO PAST SURGERIES    . none    . PORT-A-CATH REMOVAL Right 02/17/2020   Procedure: REMOVAL PORT-A-CATH;  Surgeon: [name], MD;  Location: [place];  Service: General;  Laterality: Right;  . PORTACATH PLACEMENT Right 08/17/2019   Procedure: INSERTION PORT-A-CATH WITH ULTRASOUND;  Surgeon: [name], MD;  Location: [place] [facility];  Service: General;  Laterality: Right;  . wisdom teeth removal     remotely.     There were no vitals filed for this visit.   Subjective Assessment - 04/10/20 1317    Subjective I have to cancel my Wednesday appt. Radiation is going okay, I haven't had much problem getting my arm into position either as long as I do it slowly.    Pertinent History R breast cancer, completed chemotherapy, DCIS grade 2-3, ER+ PR+  pt with right mastectomy 02/16/2200 with 7 of  9 nodes positive .  Pt had drain in  about 4 weeks and developed a cellulitis.  Pt will receive herceptin infusions and radiation    Patient Stated Goals reduce lymphedema risk and learn post op exercises    Currently in Pain? No/denies                             [facility] Adult PT Treatment/Exercise - 04/10/20 0001      Shoulder Exercises: Pulleys   Flexion 2 minutes    Flexion Limitations VCs to remind pt of correct technique    ABduction 2 minutes      Shoulder Exercises: Therapy Ball   Flexion Both;10 reps   forward lean into end of stretch   Flexion Limitations Returning therapist demo    ABduction Right;5 reps   same side lean into end of stretch     Manual Therapy   Manual Therapy Myofascial release;Passive ROM;Neural Stretch    Myofascial Release gently to Rt axilla during PROM and over cords into upper arm and antecubital fossa    Manual Lymphatic Drainage (MLD) --    Passive ROM to right shoulder to tolerance flexion and abduction    Neural Stretch To Rt UE during P/ROM  PT Long Term Goals - 03/16/20 1543      PT LONG TERM GOAL #1   Title Pt will demonstrate she has regained full shoulder ROM and function post operatively compared to baselines.    Time 8    Period Weeks    Status On-going      PT LONG TERM GOAL #2   Title Pt will demonstrate 165 degrees of left shoulder flexion to allow her to reach overhad.    Baseline 110    Time 5    Period Weeks    Status New    Target Date 04/20/20      PT LONG TERM GOAL #3   Title Pt will demonstrate 165 degrees of right shoulder abduction to allow her to reach out to the side.    Baseline 112    Time 5    Period Weeks    Status New    Target Date 04/20/20      PT LONG TERM GOAL #4   Title Pt will report a 75% improvement in pain and tightness across right chest when turning head to the left to allow improved comfort.    Time 5    Period Weeks    Status New    Target Date 04/20/20      PT LONG TERM  GOAL #5   Title Pt will be independent in a home exercise program for continued strengthening and stretching    Time 5    Period Weeks    Status New    Target Date 04/20/20                 Plan - 04/10/20 1405    Clinical Impression Statement Continued with AA/ROM stretching adding ball on wall. Then continued manual therapy stretching to Rt shoulder and UE for fascial tightness. Good imporvement of end P/ROM by end of session. Encouraged pt to work her stretches into her ADLs during day as well to promote furhter progress during radiation.    Personal Factors and Comorbidities Comorbidity 3+    Comorbidities chemo, surgery , 9 nodes removed, cellulitis    Stability/Clinical Decision Making Stable/Uncomplicated    Rehab Potential Good    PT Frequency 2x / week    PT Duration --   5 weeks   PT Treatment/Interventions ADLs/Self Care Home Management;Therapeutic exercise;Patient/family education;Therapeutic activities;Manual techniques;Manual lymph drainage;Compression bandaging;Scar mobilization;Passive range of motion;Taping    PT Next Visit Plan continue with MLD and myofascial release to cording PROM to Rtshoulder. pulleys and wall ball    PT Home Exercise Plan post op breast exercises    Consulted and Agree with Plan of Care Patient           Patient will benefit from skilled therapeutic intervention in order to improve the following deficits and impairments:  Decreased knowledge of precautions, Postural dysfunction, Impaired UE functional use, Pain, Increased fascial restricitons, Decreased strength, Decreased range of motion, Decreased scar mobility  Visit Diagnosis: Stiffness of right shoulder, not elsewhere classified  Abnormal posture  Malignant neoplasm of right breast in female, estrogen receptor positive, unspecified site of breast [facility])     Problem List Patient Active Problem List   Diagnosis Date Noted  . Right breast cancer with T3 tumor, >5 cm in greatest  dimension ([place]) 02/17/2020  . Malignant neoplasm of upper-outer quadrant of right breast in female, estrogen receptor positive ([place]) 08/03/2019  . Plantar fasciitis, bilateral 02/15/2015  . Lumbago 02/15/2015  . Screening 02/15/2015  .  Hemisensory loss 06/11/2013  . Weakness 06/11/2013    Otelia Limes, PTA 04/10/2020, 2:08 PM  [place], [place], [address] Phone: [phone]   Fax:  [phone]  Name: Paige Gilbert MRN: [id number] Date of Birth: 12-Oct-1955

## 2020-04-10 NOTE — Progress Notes (Signed)
   Covid-19 Vaccination Clinic  Name:  Delrose Rohwer    MRN: [id number] DOB: 02-10-1956  04/10/2020  Ms. Lavery was observed post Covid-19 immunization for 15 minutes without incident. She was provided with Vaccine Information Sheet and instruction to access the V-Safe system.   Ms. Daris was instructed to call 911 with any severe reactions post vaccine: Marland Kitchen Difficulty breathing  . Swelling of face and throat  . A fast heartbeat  . A bad rash all over body  . Dizziness and weakness

## 2020-04-11 ENCOUNTER — Ambulatory Visit
Admission: RE | Admit: 2020-04-11 | Discharge: 2020-04-11 | Disposition: A | Discharge status: Home / Self Care | Payer: Managed Care, Other (non HMO) | Source: Ambulatory Visit | Attending: Radiation Oncology | Admitting: Radiation Oncology

## 2020-04-11 ENCOUNTER — Other Ambulatory Visit: Payer: Self-pay

## 2020-04-11 ENCOUNTER — Ambulatory Visit: Payer: Managed Care, Other (non HMO)

## 2020-04-11 DIAGNOSIS — Z510 Encounter for antineoplastic radiation therapy: Secondary | ICD-10-CM | POA: Diagnosis not present

## 2020-04-12 ENCOUNTER — Encounter: Payer: Managed Care, Other (non HMO) | Admitting: Physical Therapy

## 2020-04-12 ENCOUNTER — Ambulatory Visit
Admission: RE | Admit: 2020-04-12 | Discharge: 2020-04-12 | Disposition: A | Discharge status: Home / Self Care | Payer: Managed Care, Other (non HMO) | Source: Ambulatory Visit | Attending: Radiation Oncology | Admitting: Radiation Oncology

## 2020-04-12 DIAGNOSIS — Z510 Encounter for antineoplastic radiation therapy: Secondary | ICD-10-CM | POA: Diagnosis not present

## 2020-04-13 ENCOUNTER — Other Ambulatory Visit: Payer: Self-pay

## 2020-04-13 ENCOUNTER — Ambulatory Visit
Admission: RE | Admit: 2020-04-13 | Discharge: 2020-04-13 | Disposition: A | Discharge status: Home / Self Care | Payer: Managed Care, Other (non HMO) | Source: Ambulatory Visit | Attending: Radiation Oncology | Admitting: Radiation Oncology

## 2020-04-13 DIAGNOSIS — Z510 Encounter for antineoplastic radiation therapy: Secondary | ICD-10-CM | POA: Diagnosis not present

## 2020-04-14 ENCOUNTER — Ambulatory Visit
Admission: RE | Admit: 2020-04-14 | Discharge: 2020-04-14 | Disposition: A | Discharge status: Home / Self Care | Payer: Managed Care, Other (non HMO) | Source: Ambulatory Visit | Attending: Radiation Oncology | Admitting: Radiation Oncology

## 2020-04-14 ENCOUNTER — Other Ambulatory Visit: Payer: Self-pay

## 2020-04-14 DIAGNOSIS — Z510 Encounter for antineoplastic radiation therapy: Secondary | ICD-10-CM | POA: Diagnosis not present

## 2020-04-17 ENCOUNTER — Ambulatory Visit: Payer: Managed Care, Other (non HMO) | Admitting: Physical Therapy

## 2020-04-17 ENCOUNTER — Ambulatory Visit
Admission: RE | Admit: 2020-04-17 | Discharge: 2020-04-17 | Disposition: A | Discharge status: Home / Self Care | Payer: Managed Care, Other (non HMO) | Source: Ambulatory Visit | Attending: Radiation Oncology | Admitting: Radiation Oncology

## 2020-04-17 ENCOUNTER — Other Ambulatory Visit: Payer: Self-pay

## 2020-04-17 ENCOUNTER — Encounter: Payer: Self-pay | Admitting: Physical Therapy

## 2020-04-17 DIAGNOSIS — Z510 Encounter for antineoplastic radiation therapy: Secondary | ICD-10-CM | POA: Diagnosis not present

## 2020-04-17 DIAGNOSIS — M25611 Stiffness of right shoulder, not elsewhere classified: Secondary | ICD-10-CM

## 2020-04-17 NOTE — Therapy (Signed)
[place], [place], [address] Phone: [phone]   Fax:  [phone]  Physical Therapy Treatment  Patient Details  Name: Paige Gilbert MRN: [id number] Date of Birth: 1955-09-18 Referring Provider (PT): Cornett   Encounter Date: 04/17/2020   PT End of Session - 04/17/20 1358    Visit Number 6    Number of Visits 10    Date for PT Re-Evaluation 04/20/20    PT Start Time 1320    PT Stop Time 1355    PT Time Calculation (min) 35 min    Activity Tolerance Patient tolerated treatment well    Behavior During Therapy [facility] for tasks assessed/performed           Past Medical History:  Diagnosis Date  . Anemia   . right breast ca dx'd 06/2019   chemo  . Varicose vein   . Varicose veins of left lower extremity    "bluging ones at top- no pain"    Past Surgical History:  Procedure Laterality Date  . MASTECTOMY MODIFIED RADICAL Right 02/17/2020   Procedure: RIGHT MASTECTOMY MODIFIED RADICAL;  Surgeon: [name], MD;  Location: [facility];  Service: General;  Laterality: Right;  . NO PAST SURGERIES    . none    . PORT-A-CATH REMOVAL Right 02/17/2020   Procedure: REMOVAL PORT-A-CATH;  Surgeon: [name], MD;  Location: [place];  Service: General;  Laterality: Right;  . PORTACATH PLACEMENT Right 08/17/2019   Procedure: INSERTION PORT-A-CATH WITH ULTRASOUND;  Surgeon: [name], MD;  Location: [place];  Service: General;  Laterality: Right;  . wisdom teeth removal     remotely.     There were no vitals filed for this visit.   Subjective Assessment - 04/17/20 1333    Subjective We had a family emergency so I drove from here to [facility] and back this morning.    Pertinent History R breast cancer, completed chemotherapy, DCIS grade 2-3, ER+ PR+  pt with right mastectomy 02/16/2200 with 7 of  9 nodes positive .  Pt had drain in about 4 weeks and developed a cellulitis.  Pt will receive  herceptin infusions and radiation    Patient Stated Goals reduce lymphedema risk and learn post op exercises    Currently in Pain? No/denies    Pain Score 0-No pain                             [facility] Adult PT Treatment/Exercise - 04/17/20 0001      Shoulder Exercises: Pulleys   Flexion 2 minutes    ABduction 2 minutes      Shoulder Exercises: Therapy Ball   Flexion Both;10 reps   forward lean into end of stretch   ABduction Right;10 reps   same side lean into end of stretch     Manual Therapy   Manual Therapy Myofascial release;Passive ROM;Soft tissue mobilization    Soft tissue mobilization gently to skin across right chest to help mobilize skin    Myofascial Release gently to Rt axilla during PROM and over cords into upper arm and antecubital fossa    Passive ROM to right shoulder to tolerance flexion and abduction    Neural Stretch --                       PT Long Term Goals - 03/16/20 1543      PT LONG TERM  GOAL #1   Title Pt will demonstrate she has regained full shoulder ROM and function post operatively compared to baselines.    Time 8    Period Weeks    Status On-going      PT LONG TERM GOAL #2   Title Pt will demonstrate 165 degrees of left shoulder flexion to allow her to reach overhad.    Baseline 110    Time 5    Period Weeks    Status New    Target Date 04/20/20      PT LONG TERM GOAL #3   Title Pt will demonstrate 165 degrees of right shoulder abduction to allow her to reach out to the side.    Baseline 112    Time 5    Period Weeks    Status New    Target Date 04/20/20      PT LONG TERM GOAL #4   Title Pt will report a 75% improvement in pain and tightness across right chest when turning head to the left to allow improved comfort.    Time 5    Period Weeks    Status New    Target Date 04/20/20      PT LONG TERM GOAL #5   Title Pt will be independent in a home exercise program for continued strengthening and  stretching    Time 5    Period Weeks    Status New    Target Date 04/20/20                 Plan - 04/17/20 1358    Clinical Impression Statement Continued with AAROM stretches using pulleys and ball on wall. Pt is still having significant cording in RUE  that limits her ROM. Focused today in increased skin mobility across R chest and decreasing tightness from cording with myofascial release.    PT Frequency 2x / week    PT Duration --   5 weeks   PT Treatment/Interventions ADLs/Self Care Home Management;Therapeutic exercise;Patient/family education;Therapeutic activities;Manual techniques;Manual lymph drainage;Compression bandaging;Scar mobilization;Passive range of motion;Taping    PT Next Visit Plan continue with MLD and myofascial release to cording PROM to Rtshoulder. pulleys and wall ball    PT Home Exercise Plan post op breast exercises    Consulted and Agree with Plan of Care Patient           Patient will benefit from skilled therapeutic intervention in order to improve the following deficits and impairments:  Decreased knowledge of precautions, Postural dysfunction, Impaired UE functional use, Pain, Increased fascial restricitons, Decreased strength, Decreased range of motion, Decreased scar mobility  Visit Diagnosis: Stiffness of right shoulder, not elsewhere classified     Problem List Patient Active Problem List   Diagnosis Date Noted  . Right breast cancer with T3 tumor, >5 cm in greatest dimension ([place]) 02/17/2020  . Malignant neoplasm of upper-outer quadrant of right breast in female, estrogen receptor positive ([place]) 08/03/2019  . Plantar fasciitis, bilateral 02/15/2015  . Lumbago 02/15/2015  . Screening 02/15/2015  . Hemisensory loss 06/11/2013  . Weakness 06/11/2013    Allyson Sabal [facility] 04/17/2020, 2:00 PM  [place], [place], [address] Phone: [phone]   Fax:   [phone]  Name: Paige Gilbert MRN: [id number] Date of Birth: 04/26/1956  Manus Gunning, PT 04/17/20 2:00 PM

## 2020-04-18 ENCOUNTER — Ambulatory Visit
Admission: RE | Admit: 2020-04-18 | Discharge: 2020-04-18 | Disposition: A | Discharge status: Home / Self Care | Payer: Managed Care, Other (non HMO) | Source: Ambulatory Visit | Attending: Radiation Oncology | Admitting: Radiation Oncology

## 2020-04-18 ENCOUNTER — Other Ambulatory Visit: Payer: Self-pay

## 2020-04-18 DIAGNOSIS — Z510 Encounter for antineoplastic radiation therapy: Secondary | ICD-10-CM | POA: Diagnosis not present

## 2020-04-19 ENCOUNTER — Encounter: Payer: Self-pay | Admitting: Physical Therapy

## 2020-04-19 ENCOUNTER — Ambulatory Visit
Admission: RE | Admit: 2020-04-19 | Discharge: 2020-04-19 | Disposition: A | Discharge status: Home / Self Care | Payer: Managed Care, Other (non HMO) | Source: Ambulatory Visit | Attending: Radiation Oncology | Admitting: Radiation Oncology

## 2020-04-19 ENCOUNTER — Ambulatory Visit: Payer: Managed Care, Other (non HMO) | Admitting: Physical Therapy

## 2020-04-19 DIAGNOSIS — Z510 Encounter for antineoplastic radiation therapy: Secondary | ICD-10-CM | POA: Diagnosis not present

## 2020-04-19 DIAGNOSIS — R293 Abnormal posture: Secondary | ICD-10-CM

## 2020-04-19 DIAGNOSIS — M25611 Stiffness of right shoulder, not elsewhere classified: Secondary | ICD-10-CM

## 2020-04-19 NOTE — Therapy (Signed)
[place], [place], [address] Phone: [phone]   Fax:  [phone]  Physical Therapy Treatment  Patient Details  Name: Paige Gilbert MRN: [id number] Date of Birth: 02/08/1956 Referring Provider (PT): Cornett   Encounter Date: 04/19/2020   PT End of Session - 04/19/20 1629    Visit Number 7    Number of Visits 10    Date for PT Re-Evaluation 04/20/20    PT Start Time 1300    PT Stop Time 1345    PT Time Calculation (min) 45 min    Activity Tolerance Patient tolerated treatment well    Behavior During Therapy [facility] for tasks assessed/performed           Past Medical History:  Diagnosis Date  . Anemia   . right breast ca dx'd 06/2019   chemo  . Varicose vein   . Varicose veins of left lower extremity    "bluging ones at top- no pain"    Past Surgical History:  Procedure Laterality Date  . MASTECTOMY MODIFIED RADICAL Right 02/17/2020   Procedure: RIGHT MASTECTOMY MODIFIED RADICAL;  Surgeon: [name], MD;  Location: [place];  Service: General;  Laterality: Right;  . NO PAST SURGERIES    . none    . PORT-A-CATH REMOVAL Right 02/17/2020   Procedure: REMOVAL PORT-A-CATH;  Surgeon: [name], MD;  Location: [place];  Service: General;  Laterality: Right;  . PORTACATH PLACEMENT Right 08/17/2019   Procedure: INSERTION PORT-A-CATH WITH ULTRASOUND;  Surgeon: [name], MD;  Location: [place];  Service: General;  Laterality: Right;  . wisdom teeth removal     remotely.     There were no vitals filed for this visit.   Subjective Assessment - 04/19/20 1316    Subjective Pt said she got relief from the treatment last visit.  She has been wearing the Tg soft at time and feels that that helped. She would like to get a prophylactic compression sleeve. She has been swimming at times and finds that helps with her shoulder  Pt will have radiation til October 18    Pertinent History R breast  cancer, completed chemotherapy, DCIS grade 2-3, ER+ PR+  pt with right mastectomy 02/16/2200 with 7 of  9 nodes positive .  Pt had drain in about 4 weeks and developed a cellulitis.  Pt will receive herceptin infusions and radiation    Patient Stated Goals reduce lymphedema risk and learn post op exercises    Currently in Pain? No/denies              [facility] PT Assessment - 04/19/20 0001      AROM   Right Shoulder Flexion 145 Degrees    Right Shoulder ABduction 168 Degrees                         [facility] Adult PT Treatment/Exercise - 04/19/20 0001      Shoulder Exercises: Supine   Horizontal ABduction AROM;Right;5 reps    External Rotation AROM;Right;5 reps    Other Supine Exercises small circles with hand pointed to ceiling     Other Supine Exercises lower trunk rotation for stretch to trunk       Shoulder Exercises: Sidelying   External Rotation AROM;10 reps    ABduction AROM;Right    Other Sidelying Exercises small circles with hand pointed to ceiling       Manual Therapy   Manual Therapy Myofascial release;Passive  ROM;Soft tissue mobilization    Manual therapy comments used mirror for visual cues about how exercise to arm and lower body can stretch the skin on the chest     Edema Management Pt ok'd to send demographics to [facility] for compression sleeve     Myofascial Release to cording in right axilla     Manual Lymphatic Drainage (MLD) avoided skin on left chest that appears to be radiation, gently stationary circles down right arm to wrist and return     Passive ROM to right shoulder to tolerance flexion and abduction                       PT Long Term Goals - 03/16/20 1543      PT LONG TERM GOAL #1   Title Pt will demonstrate she has regained full shoulder ROM and function post operatively compared to baselines.    Time 8    Period Weeks    Status On-going      PT LONG TERM GOAL #2   Title Pt will demonstrate 165 degrees of left shoulder  flexion to allow her to reach overhad.    Baseline 110    Time 5    Period Weeks    Status New    Target Date 04/20/20      PT LONG TERM GOAL #3   Title Pt will demonstrate 165 degrees of right shoulder abduction to allow her to reach out to the side.    Baseline 112    Time 5    Period Weeks    Status New    Target Date 04/20/20      PT LONG TERM GOAL #4   Title Pt will report a 75% improvement in pain and tightness across right chest when turning head to the left to allow improved comfort.    Time 5    Period Weeks    Status New    Target Date 04/20/20      PT LONG TERM GOAL #5   Title Pt will be independent in a home exercise program for continued strengthening and stretching    Time 5    Period Weeks    Status New    Target Date 04/20/20                 Plan - 04/19/20 1630    Clinical Impression Statement Pt has impoved shoulder ROM but still has sensation ofpulling down her arm and thick cording in axilla.  She would benefit from prophylactic sleeve to wear while working to help this.She is beginning to show changes in skin from radiation esecially at chest so care was taken not to manually stress this area. Pt felt better at end of session    Personal Factors and Comorbidities Comorbidity 3+    Comorbidities chemo, surgery , 9 nodes removed, cellulitis    Stability/Clinical Decision Making Stable/Uncomplicated    Rehab Potential Good    PT Frequency 2x / week    PT Treatment/Interventions ADLs/Self Care Home Management;Therapeutic exercise;Patient/family education;Therapeutic activities;Manual techniques;Manual lymph drainage;Compression bandaging;Scar mobilization;Passive range of motion;Taping    PT Next Visit Plan continue with MLD and myofascial release to cording PROM to Rtshoulder. pulleys and wall ball  schedule pt for fittingfor prophylactic sleeve  with [place] post op breast exercises    Recommended Other Services demographics  sent to [facility] fo prophylactic sleeve    Consulted and  Agree with Plan of Care Patient           Patient will benefit from skilled therapeutic intervention in order to improve the following deficits and impairments:  Decreased knowledge of precautions, Postural dysfunction, Impaired UE functional use, Pain, Increased fascial restricitons, Decreased strength, Decreased range of motion, Decreased scar mobility  Visit Diagnosis: Stiffness of right shoulder, not elsewhere classified  Abnormal posture     Problem List Patient Active Problem List   Diagnosis Date Noted  . Right breast cancer with T3 tumor, >5 cm in greatest dimension ([place]) 02/17/2020  . Malignant neoplasm of upper-outer quadrant of right breast in female, estrogen receptor positive ([place]) 08/03/2019  . Plantar fasciitis, bilateral 02/15/2015  . Lumbago 02/15/2015  . Screening 02/15/2015  . Hemisensory loss 06/11/2013  . Weakness 06/11/2013   Donato Heinz. Owens Shark PT  Norwood Levo 04/19/2020, 4:37 PM  [place], [place], [address] Phone: [phone]   Fax:  [phone]  Name: Myriah Boggus MRN: [id number] Date of Birth: 25-Apr-1956

## 2020-04-20 ENCOUNTER — Other Ambulatory Visit: Payer: Self-pay

## 2020-04-20 ENCOUNTER — Ambulatory Visit
Admission: RE | Admit: 2020-04-20 | Discharge: 2020-04-20 | Disposition: A | Discharge status: Home / Self Care | Payer: Managed Care, Other (non HMO) | Source: Ambulatory Visit | Attending: Radiation Oncology | Admitting: Radiation Oncology

## 2020-04-20 DIAGNOSIS — Z510 Encounter for antineoplastic radiation therapy: Secondary | ICD-10-CM | POA: Diagnosis not present

## 2020-04-21 ENCOUNTER — Ambulatory Visit
Admission: RE | Admit: 2020-04-21 | Discharge: 2020-04-21 | Disposition: A | Discharge status: Home / Self Care | Payer: Managed Care, Other (non HMO) | Source: Ambulatory Visit | Attending: Radiation Oncology | Admitting: Radiation Oncology

## 2020-04-21 DIAGNOSIS — Z510 Encounter for antineoplastic radiation therapy: Secondary | ICD-10-CM | POA: Diagnosis not present

## 2020-04-24 ENCOUNTER — Ambulatory Visit
Admission: RE | Admit: 2020-04-24 | Discharge: 2020-04-24 | Disposition: A | Discharge status: Home / Self Care | Payer: Managed Care, Other (non HMO) | Source: Ambulatory Visit | Attending: Radiation Oncology | Admitting: Radiation Oncology

## 2020-04-24 ENCOUNTER — Encounter: Payer: Self-pay | Admitting: Physical Therapy

## 2020-04-24 ENCOUNTER — Other Ambulatory Visit: Payer: Self-pay

## 2020-04-24 ENCOUNTER — Ambulatory Visit: Payer: Managed Care, Other (non HMO) | Admitting: Physical Therapy

## 2020-04-24 DIAGNOSIS — M25611 Stiffness of right shoulder, not elsewhere classified: Secondary | ICD-10-CM

## 2020-04-24 DIAGNOSIS — Z510 Encounter for antineoplastic radiation therapy: Secondary | ICD-10-CM | POA: Diagnosis not present

## 2020-04-24 DIAGNOSIS — R293 Abnormal posture: Secondary | ICD-10-CM

## 2020-04-24 DIAGNOSIS — Z170 Estrogen receptor positive status [ER+]: Secondary | ICD-10-CM

## 2020-04-24 NOTE — Therapy (Signed)
[place], [place], [address] Phone: [phone]   Fax:  [phone]  Physical Therapy Treatment  Patient Details  Name: Paige Gilbert MRN: [id number] Date of Birth: [birth year] Referring Provider (PT): Cornett   Encounter Date: 04/24/2020   PT End of Session - 04/24/20 1357    Visit Number 8    Number of Visits 14    Date for PT Re-Evaluation 06/05/20   increased time due to hold during end of radiation   PT Start Time 1315   pt arrived late   PT Stop Time 1350    PT Time Calculation (min) 35 min    Activity Tolerance Patient tolerated treatment well    Behavior During Therapy [facility] for tasks assessed/performed           Past Medical History:  Diagnosis Date  . Anemia   . right breast ca dx'd 06/2019   chemo  . Varicose vein   . Varicose veins of left lower extremity    "bluging ones at top- no pain"    Past Surgical History:  Procedure Laterality Date  . MASTECTOMY MODIFIED RADICAL Right 02/17/2020   Procedure: RIGHT MASTECTOMY MODIFIED RADICAL;  Surgeon: [name], MD;  Location: [place];  Service: General;  Laterality: Right;  . NO PAST SURGERIES    . none    . PORT-A-CATH REMOVAL Right 02/17/2020   Procedure: REMOVAL PORT-A-CATH;  Surgeon: [name], MD;  Location: [place];  Service: General;  Laterality: Right;  . PORTACATH PLACEMENT Right 08/17/2019   Procedure: INSERTION PORT-A-CATH WITH ULTRASOUND;  Surgeon: [name], MD;  Location: [place];  Service: General;  Laterality: Right;  . wisdom teeth removal     remotely.     There were no vitals filed for this visit.   Subjective Assessment - 04/24/20 1320    Subjective I would say my cording is better compared to last time I was here.    Pertinent History R breast cancer, completed chemotherapy, DCIS grade 2-3, ER+ PR+  pt with right mastectomy 02/16/2200 with 7 of  9 nodes positive .  Pt had drain in about 4 weeks and  developed a cellulitis.  Pt will receive herceptin infusions and radiation    Patient Stated Goals reduce lymphedema risk and learn post op exercises    Currently in Pain? No/denies    Pain Score 0-No pain              [facility] PT Assessment - 04/24/20 0001      AROM   Right Shoulder Flexion 155 Degrees    Right Shoulder ABduction 178 Degrees                         [facility] Adult PT Treatment/Exercise - 04/24/20 0001      Manual Therapy   Myofascial Release to cording in right axilla     Manual Lymphatic Drainage (MLD) avoided skin on right chest that appears to be radiation, gently stationary circles down right arm to wrist and return     Passive ROM to right shoulder to tolerance flexion and abduction                       PT Long Term Goals - 04/24/20 1320      PT LONG TERM GOAL #1   Title Pt will demonstrate she has regained full shoulder ROM and function post operatively compared to  baselines.    Baseline 04/24/20- pt has return to baseline ROM    Time 8    Period Weeks    Status Achieved      PT LONG TERM GOAL #2   Title Pt will demonstrate 165 degrees of left shoulder flexion to allow her to reach overhad.    Baseline 110, 04/24/20- 155    Time 5    Period Weeks    Status On-going      PT LONG TERM GOAL #3   Title Pt will demonstrate 165 degrees of right shoulder abduction to allow her to reach out to the side.    Baseline 112, 04/24/20- 178 degrees    Time 5    Period Weeks    Status Achieved      PT LONG TERM GOAL #4   Title Pt will report a 75% improvement in pain and tightness across right chest when turning head to the left to allow improved comfort.    Baseline 04/24/20- 85% improvement    Time 5    Period Weeks    Status Achieved      PT LONG TERM GOAL #5   Title Pt will be independent in a home exercise program for continued strengthening and stretching    Time 5    Period Weeks    Status On-going                  Plan - 04/24/20 1354    Clinical Impression Statement Reassessed pt's progress towards goals in therapy. Pt has met many of her goals for therapy and has returned to baseline ROM. She still has tightness from cording at end range shoulder flexion and it progressing towards shoulder flexion goal. Pt's skin is becoming more red from radiation so avoided this area. WIll see pt one more time prior to her completing radiation then will place her on hold for two weeks to allow her skin to heal. Once her skin is healed will begin scar mobilization at port site where pt feels increased tightness. Pt would benefit from additional skilled PT services to continue to decrease cording, improve shoulder flexion ROM at end range and improve skin mobility across right chest.    Rehab Potential Good    PT Frequency 1x / week    PT Duration 6 weeks   will be placed on hold 2 weeks at end of radiation to allow skin to heal   PT Treatment/Interventions ADLs/Self Care Home Management;Therapeutic exercise;Patient/family education;Therapeutic activities;Manual techniques;Manual lymph drainage;Compression bandaging;Scar mobilization;Passive range of motion;Taping    PT Next Visit Plan see if pt scheduled 1x/wk appt for 4 wks starting week of 10/25, continue with MLD and myofascial release to cording PROM to Rtshoulder. pulleys and wall ball  schedule pt for fittingfor prophylactic sleeve  with [facility]    PT Home Exercise Plan post op breast exercises    Consulted and Agree with Plan of Care Patient           Patient will benefit from skilled therapeutic intervention in order to improve the following deficits and impairments:  Decreased knowledge of precautions, Postural dysfunction, Impaired UE functional use, Pain, Increased fascial restricitons, Decreased strength, Decreased range of motion, Decreased scar mobility  Visit Diagnosis: Stiffness of right shoulder, not elsewhere classified     Problem List Patient  Active Problem List   Diagnosis Date Noted  . Right breast cancer with T3 tumor, >5 cm in greatest dimension ([place]) 02/17/2020  . Malignant neoplasm  of upper-outer quadrant of right breast in female, estrogen receptor positive ([place]) 08/03/2019  . Plantar fasciitis, bilateral 02/15/2015  . Lumbago 02/15/2015  . Screening 02/15/2015  . Hemisensory loss 06/11/2013  . Weakness 06/11/2013    Allyson Sabal [facility] 04/24/2020, 1:58 PM  [place], [place], [address] Phone: [phone]   Fax:  [phone]  Name: Paige Gilbert MRN: [id number] Date of Birth: 11/14/1955  Manus Gunning, PT 04/24/20 1:58 PM

## 2020-04-24 NOTE — Addendum Note (Signed)
Addended by: Wynelle Beckmann, Hilaria Ota L on: 04/24/2020 02:00 PM   Modules accepted: Orders

## 2020-04-25 ENCOUNTER — Ambulatory Visit
Admission: RE | Admit: 2020-04-25 | Discharge: 2020-04-25 | Disposition: A | Discharge status: Home / Self Care | Payer: Managed Care, Other (non HMO) | Source: Ambulatory Visit | Attending: Radiation Oncology | Admitting: Radiation Oncology

## 2020-04-25 DIAGNOSIS — Z510 Encounter for antineoplastic radiation therapy: Secondary | ICD-10-CM | POA: Diagnosis not present

## 2020-04-25 NOTE — Progress Notes (Signed)
[place]  [phone] [phone]     ID: Paige Gilbert DOB: [birth year]  MR#: [id number]  GMW#:[id number]  Patient Care Team: [name], NP as PCP - General (Family Medicine) [name], RN as Oncology Nurse Navigator [name], RN as Oncology Nurse Navigator Milan Perkins, [name], MD as Consulting Physician (Oncology) [name], MD as Attending Physician (Radiation Oncology) [name], MD as Consulting Physician (General Surgery) [name], MD OTHER MD:  CHIEF COMPLAINT: Locally advanced estrogen receptor positive breast cancer  CURRENT TREATMENT: Herceptin, Perjeta, adjuvant radiation   INTERVAL HISTORY: Paige Gilbert returns today for follow up of her locally advanced breast cancer.  Since her last visit, she underwent echocardiogram on 03/10/2020 showing an ejection fraction of 60-65%.  She was referred back to Dr. Isidore Moos on 03/13/2020 to discuss radiation therapy. She was scheduled for CT simulation that day, but she was unable to raise her arm high enough due to right chest wall pain. This was rescheduled for 03/27/2020 and she subsequently began treatment on 04/04/2020. Her final treatment is scheduled for 05/15/2020.  She transitioned to herceptin and perjeta on 03/15/2020. Today is day 1 cycle 3.   REVIEW OF SYSTEMS: Paige Gilbert is tolerating radiation generally well.  She is having some itching and some hyperpigmentation.  She feels tired although she is still working a few days a week.  She has had both doses of the Pfizer vaccine and is planning to get the booster within the next few days.  She is also tolerating Herceptin and Pertuzumab well.  She has minimal change in her bowel movements.  Detailed review of systems was otherwise noncontributory   HISTORY OF CURRENT ILLNESS: From the original intake note:  Paige Gilbert has noted a difference between her right and left breast for several years.  She  last had a mammogram she thinks about 7 or 8 years ago.  More recently it seemed to her that her right breast was becoming more firm.  She brought it to medical attention and underwent bilateral diagnostic mammography with tomography and right breast ultrasonography at [facility] on 07/07/2019 showing: heterogeneously dense breast tissue; right nipple retraction; architectural distortion with microcalcifications and irregular dense tissue in the upper-outer quadrant; at least 3 abnormal right axillary lymph nodes.  Accordingly on 07/13/2019 she proceeded to biopsy of the right breast area in question. The pathology from this procedure ([id number]) showed:   1. Right Breast, upper-outer quadrant, closer to the nipple  - invasive ductal carcinoma with micropapillary features, grade 2  - ductal carcinoma in situ, grade 2-3, solid and cribriform pattern with comedonecrosis  - Prognostic indicators significant for: estrogen receptor, 100% positive with strong staining intensity and progesterone receptor, 0% negative. HER2 equivocal by immunohistochemistry (2+), but negative by fluorescent in situ hybridization with a signals ratio 1.5 and number per cell 3.8.  2. Right Breast, upper-outer quadrant, axillary tail  - invasive ductal carcinoma, grade 2  - ductal carcinoma in situ, grade 2-3  - Prognostic indicators significant for: estrogen receptor, 100% positive and progesterone receptor, 50% positive, both with strong staining intensity. HER2 equivocal by immunohistochemistry (2+), but negative by fluorescent in situ hybridization with a signals ratio 1.9 and number per cell 5.3.  The patient's subsequent history is as detailed below.   PAST MEDICAL HISTORY: Past Medical History:  Diagnosis Date  . Anemia   . right breast ca dx'd 06/2019   chemo  . Varicose vein   . Varicose veins  of left lower extremity    "bluging ones at top- no pain"    PAST SURGICAL HISTORY: Past Surgical History:   Procedure Laterality Date  . MASTECTOMY MODIFIED RADICAL Right 02/17/2020   Procedure: RIGHT MASTECTOMY MODIFIED RADICAL;  Surgeon: [name], MD;  Location: [place];  Service: General;  Laterality: Right;  . NO PAST SURGERIES    . none    . PORT-A-CATH REMOVAL Right 02/17/2020   Procedure: REMOVAL PORT-A-CATH;  Surgeon: [name], MD;  Location: [place];  Service: General;  Laterality: Right;  . PORTACATH PLACEMENT Right 08/17/2019   Procedure: INSERTION PORT-A-CATH WITH ULTRASOUND;  Surgeon: [name], MD;  Location: [place];  Service: General;  Laterality: Right;  . wisdom teeth removal     remotely.     FAMILY HISTORY: Family History  Problem Relation Age of Onset  . Diabetes Father   . Blindness Mother   . Congestive Heart Failure Sister   . Gallbladder disease Sister        removed  . Diabetes Sister   . Colitis Sister    The patient's father died at age 48 from a stroke.  The patient's mother died from multiple medical problems not related to cancer at age 59.  The patient had 4 brothers, 7 sisters.  There is no breast ovarian or prostate cancer in the family to her knowledge.  She had one cousin with pancreatic cancer.   GYNECOLOGIC HISTORY:  No LMP recorded. Patient is postmenopausal. Menarche: 65 years old Age at first live birth: 64 years old [place] P 5 LMP 45 Contraceptive: Brief use of oral contraceptives as a teenager HRT no Hysterectomy?  No BSO?    SOCIAL HISTORY: (updated 07/2019)  Paige Gilbert works in a Engineer, agricultural, and Education administrator houses.  Her husband of 74 years Paige Gilbert is a Chief Operating Officer.  Their children are [name], 86, who lives in [place] and is a Veterinary surgeon in the [employer]; [name] 33 who works as a Designer, jewellery at [employer]; [name] who lives in [place], and is not currently employed; Sherwood lives in [place], and [name] who is an Armed forces training and education officer in [place].  The patient has 4 grandchildren all under  the age of 30    ADVANCED DIRECTIVES: Husband Paige Gilbert is her HCPOA.   HEALTH MAINTENANCE: Social History   Tobacco Use  . Smoking status: Never Smoker  . Smokeless tobacco: Never Used  Vaping Use  . Vaping Use: Never used  Substance Use Topics  . Alcohol use: Yes    Comment: rarely  . Drug use: No     Colonoscopy: never done  PAP: Remote  Bone density: Never   No Known Allergies  Current Outpatient Medications  Medication Sig Dispense Refill  . Calcium Citrate-Vitamin D (CALCIUM CITRATE + D PO) Take 1 tablet by mouth 3 (three) times a week.     Marland Kitchen ibuprofen (ADVIL) 800 MG tablet Take 1 tablet (800 mg total) by mouth every 8 (eight) hours as needed. 30 tablet 0  . ibuprofen (ADVIL) 800 MG tablet Take 1 tablet (800 mg total) by mouth every 8 (eight) hours as needed. 30 tablet 0  . lidocaine-prilocaine (EMLA) cream Apply to affected area once 30 g 3  . LORazepam (ATIVAN) 0.5 MG tablet Take 1 tablet (0.5 mg total) by mouth at bedtime as needed (Nausea or vomiting). 30 tablet 0  . naproxen sodium (ALEVE) 220 MG tablet Take 220 mg by mouth daily as needed (pain).     Marland Kitchen  oxyCODONE (OXY IR/ROXICODONE) 5 MG immediate release tablet Take 1 tablet (5 mg total) by mouth every 6 (six) hours as needed for severe pain. 15 tablet 0  . prochlorperazine (COMPAZINE) 10 MG tablet Take 1 tablet (10 mg total) by mouth every 6 (six) hours as needed (Nausea or vomiting). 30 tablet 1   No current facility-administered medications for this visit.    OBJECTIVE: White woman who appears younger than stated age 59:   04/26/20 1240  BP: 106/61  Pulse: 69  Resp: 17  Temp: 98.4 F (36.9 C)  SpO2: 99%     Body mass index is 24.41 kg/m.   Wt Readings from Last 3 Encounters:  04/26/20 142 lb 3.2 oz (64.5 kg)  03/15/20 139 lb 9.6 oz (63.3 kg)  03/13/20 139 lb 2 oz (63.1 kg)  ECOG FS:1 - Symptomatic but completely ambulatory  Sclerae unicteric, EOMs intact Wearing a mask No cervical or  supraclavicular adenopathy Lungs no rales or rhonchi Heart regular rate and rhythm Abd soft, nontender, positive bowel sounds MSK no focal spinal tenderness, no upper extremity lymphedema Neuro: nonfocal, well oriented, appropriate affect Breasts: Status post right mastectomy, no evidence of local recurrence.  Left breast is benign.  Both axillae are benign.   LAB RESULTS:  CMP     Component Value Date/Time   NA 139 04/26/2020 1213   K 3.9 04/26/2020 1213   CL 108 04/26/2020 1213   CO2 23 04/26/2020 1213   GLUCOSE 105 (H) 04/26/2020 1213   BUN 14 04/26/2020 1213   CREATININE 0.74 04/26/2020 1213   CREATININE 0.79 08/04/2019 1546   CREATININE 0.76 03/27/2015 1146   CALCIUM 9.1 04/26/2020 1213   PROT 6.6 04/26/2020 1213   ALBUMIN 3.8 04/26/2020 1213   AST 18 04/26/2020 1213   AST 17 08/04/2019 1546   ALT 26 04/26/2020 1213   ALT 21 08/04/2019 1546   ALKPHOS 73 04/26/2020 1213   BILITOT 0.3 04/26/2020 1213   BILITOT 0.2 (L) 08/04/2019 1546   GFRNONAA >60 04/26/2020 1213   GFRNONAA >60 08/04/2019 1546   GFRNONAA 87 03/27/2015 1146   GFRAA >60 04/26/2020 1213   GFRAA >60 08/04/2019 1546   GFRAA >89 03/27/2015 1146    No results found for: TOTALPROTELP, ALBUMINELP, A1GS, A2GS, BETS, BETA2SER, GAMS, MSPIKE, SPEI  Lab Results  Component Value Date   WBC 3.7 (L) 04/26/2020   NEUTROABS 2.3 04/26/2020   HGB 12.2 04/26/2020   HCT 36.1 04/26/2020   MCV 83.8 04/26/2020   PLT 160 04/26/2020    No results found for: LABCA2  No components found for: NOIBBC488  No results for input(s): INR in the last 168 hours.  No results found for: LABCA2  No results found for: QBV694  No results found for: HWT888  No results found for: KCM034  Lab Results  Component Value Date   CA2729 27.4 08/18/2019    No components found for: HGQUANT  No results found for: CEA1 / No results found for: CEA1   No results found for: AFPTUMOR  No results found for: CHROMOGRNA  No  results found for: KPAFRELGTCHN, LAMBDASER, KAPLAMBRATIO (kappa/lambda light chains)  No results found for: HGBA, HGBA2QUANT, HGBFQUANT, HGBSQUAN (Hemoglobinopathy evaluation)   No results found for: LDH  Lab Results  Component Value Date   IRON 94 02/15/2015   TIBC 272 02/15/2015   IRONPCTSAT 35 02/15/2015   (Iron and TIBC)  Lab Results  Component Value Date   FERRITIN 70 02/15/2015    Urinalysis  No results found for: COLORURINE, APPEARANCEUR, LABSPEC, PHURINE, GLUCOSEU, HGBUR, BILIRUBINUR, KETONESUR, PROTEINUR, UROBILINOGEN, NITRITE, LEUKOCYTESUR   STUDIES: No results found.   ELIGIBLE FOR AVAILABLE RESEARCH PROTOCOL:   ASSESSMENT: 64 y.o. [place], [place] woman status post right breast biopsy x2 on 07/13/2019 for a clinical T3-T4 N1-2, stage III invasive ductal carcinoma, grade 2, estrogen receptor positive, progesterone receptor variable, HER-2 not amplified.  (a) breast MRI 08/15/2019 showed a 7.6 area of enhancement, and 5 suspicious right axillary lymph nodes as well as two 0.5 mm additional masses in the inferior right breast   (b) CT chest 08/10/2019 showed no evidence of metastatic disease  (c) bone scan 08/27/2019 showed a single focus of activity at T9,  (d) thoracic spine MRI 09/13/2019 showed degenerative changes, no evidence of metastatic disease  (1) neoadjuvant chemotherapy consisting of cyclophosphamide and doxorubicin in dose dense fashion x4 starting 08/18/2019, completed 09/28/2019, followed by weekly paclitaxel x12 starting 10/12/2019, completed 01/11/2020  (2) status post right modified radical mastectomy 02/17/2020 for a residual ypT3 ypN2 invasive ductal carcinoma, with negative margins, estrogen and progesterone receptor positive, and now also HER-2 positive with a ratio of 2.98, number per cell 7.08  (3) adjuvant radiation to be completed 05/15/2020  (4) antiestrogens to start at the completion of local treatment  (5) trastuzumab/pertuzumab started  03/15/2020, to continue for 1 year  (a) echocardiogram 03/10/2020 shows an ejection fraction in the 60-65% range   PLAN: Perry will complete her radiation treatments mid-October.  I do not generally start the antiestrogens immediately after the stop of radiation but will start them early November.  Before her early November visit with me she will have a baseline bone density scan at [facility] which will help [place] decide which is the best antiestrogen for her.  She is tolerating the trastuzumab and Pertuzumab well and the plan is to continue that through August of next year.  I plan to obtain a baseline CT of the chest sometime in December.  She knows to call for any other issue that may develop before the next visit.  Total encounter time 30 minutes.Sarajane Jews C. Tobey Schmelzle, MD 04/26/20 1:17 PM Medical Oncology and Hematology [facility] [place], [place] [address] Tel. [phone]    Fax. [phone]   I, Wilburn Mylar, am acting as scribe for Dr. [name]. Negin Hegg.  I, Lurline Del MD, have reviewed the above documentation for accuracy and completeness, and I agree with the above.   *Total Encounter Time as defined by the Centers for Medicare and Medicaid Services includes, in addition to the face-to-face time of a patient visit (documented in the note above) non-face-to-face time: obtaining and reviewing outside history, ordering and reviewing medications, tests or procedures, care coordination (communications with other health care professionals or caregivers) and documentation in the medical record.

## 2020-04-26 ENCOUNTER — Inpatient Hospital Stay (HOSPITAL_BASED_OUTPATIENT_CLINIC_OR_DEPARTMENT_OTHER): Payer: Managed Care, Other (non HMO) | Admitting: Oncology

## 2020-04-26 ENCOUNTER — Inpatient Hospital Stay: Payer: Managed Care, Other (non HMO)

## 2020-04-26 ENCOUNTER — Ambulatory Visit
Admission: RE | Admit: 2020-04-26 | Discharge: 2020-04-26 | Disposition: A | Discharge status: Home / Self Care | Payer: Managed Care, Other (non HMO) | Source: Ambulatory Visit | Attending: Radiation Oncology | Admitting: Radiation Oncology

## 2020-04-26 ENCOUNTER — Other Ambulatory Visit: Payer: Self-pay

## 2020-04-26 VITALS — BP 106/61 | HR 69 | Temp 98.4°F | Resp 17 | Ht 64.0 in | Wt 142.2 lb

## 2020-04-26 DIAGNOSIS — C50411 Malignant neoplasm of upper-outer quadrant of right female breast: Secondary | ICD-10-CM | POA: Diagnosis not present

## 2020-04-26 DIAGNOSIS — Z510 Encounter for antineoplastic radiation therapy: Secondary | ICD-10-CM | POA: Diagnosis not present

## 2020-04-26 DIAGNOSIS — C50911 Malignant neoplasm of unspecified site of right female breast: Secondary | ICD-10-CM

## 2020-04-26 DIAGNOSIS — Z170 Estrogen receptor positive status [ER+]: Secondary | ICD-10-CM

## 2020-04-26 LAB — COMPREHENSIVE METABOLIC PANEL
ALT: 26 U/L (ref 0–44)
AST: 18 U/L (ref 15–41)
Albumin: 3.8 g/dL (ref 3.5–5.0)
Alkaline Phosphatase: 73 U/L (ref 38–126)
Anion gap: 8 (ref 5–15)
BUN: 14 mg/dL (ref 8–23)
CO2: 23 mmol/L (ref 22–32)
Calcium: 9.1 mg/dL (ref 8.9–10.3)
Chloride: 108 mmol/L (ref 98–111)
Creatinine, Ser: 0.74 mg/dL (ref 0.44–1.00)
GFR calc Af Amer: >60 mL/min (ref >60)
GFR calc non Af Amer: >60 mL/min (ref >60)
Glucose, Bld: 105 mg/dL — ABNORMAL HIGH (ref 70–99)
Potassium: 3.9 mmol/L (ref 3.5–5.1)
Sodium: 139 mmol/L (ref 135–145)
Total Bilirubin: 0.3 mg/dL (ref 0.3–1.2)
Total Protein: 6.6 g/dL (ref 6.5–8.1)

## 2020-04-26 LAB — CBC WITH DIFFERENTIAL/PLATELET
Abs Immature Granulocytes: 0.01 10*3/uL (ref 0.00–0.07)
Basophils Absolute: 0.1 10*3/uL (ref 0.0–0.1)
Basophils Relative: 1 %
Eosinophils Absolute: 0.1 10*3/uL (ref 0.0–0.5)
Eosinophils Relative: 3 %
HCT: 36.1 % (ref 36.0–46.0)
Hemoglobin: 12.2 g/dL (ref 12.0–15.0)
Immature Granulocytes: 0 %
Lymphocytes Relative: 27 %
Lymphs Abs: 1 10*3/uL (ref 0.7–4.0)
MCH: 28.3 pg (ref 26.0–34.0)
MCHC: 33.8 g/dL (ref 30.0–36.0)
MCV: 83.8 fL (ref 80.0–100.0)
Monocytes Absolute: 0.3 10*3/uL (ref 0.1–1.0)
Monocytes Relative: 8 %
Neutro Abs: 2.3 10*3/uL (ref 1.7–7.7)
Neutrophils Relative %: 61 %
Platelets: 160 10*3/uL (ref 150–400)
RBC: 4.31 MIL/uL (ref 3.87–5.11)
RDW: 12.8 % (ref 11.5–15.5)
WBC: 3.7 10*3/uL — ABNORMAL LOW (ref 4.0–10.5)
nRBC: 0 % (ref 0.0–0.2)

## 2020-04-26 MED ORDER — DIPHENHYDRAMINE HCL 25 MG PO CAPS
25.0000 mg | ORAL_CAPSULE | Freq: Once | ORAL | Status: AC
  Administered 2020-04-26: 25 mg via ORAL

## 2020-04-26 MED ORDER — PALONOSETRON HCL INJECTION 0.25 MG/5ML
INTRAVENOUS | Status: AC
  Filled 2020-04-26: qty 5

## 2020-04-26 MED ORDER — SODIUM CHLORIDE 0.9 % IV SOLN
Freq: Once | INTRAVENOUS | Status: AC
  Filled 2020-04-26: qty 250

## 2020-04-26 MED ORDER — TRASTUZUMAB-DKST CHEMO 150 MG IV SOLR
6.0000 mg/kg | Freq: Once | INTRAVENOUS | Status: AC
  Administered 2020-04-26: 378 mg via INTRAVENOUS
  Filled 2020-04-26: qty 18

## 2020-04-26 MED ORDER — SODIUM CHLORIDE 0.9 % IV SOLN
420.0000 mg | Freq: Once | INTRAVENOUS | Status: AC
  Administered 2020-04-26: 420 mg via INTRAVENOUS
  Filled 2020-04-26: qty 14

## 2020-04-26 MED ORDER — ACETAMINOPHEN 325 MG PO TABS
650.0000 mg | ORAL_TABLET | Freq: Once | ORAL | Status: AC
  Administered 2020-04-26: 650 mg via ORAL

## 2020-04-26 MED ORDER — DIPHENHYDRAMINE HCL 25 MG PO CAPS
ORAL_CAPSULE | ORAL | Status: AC
  Filled 2020-04-26: qty 1

## 2020-04-26 MED ORDER — ACETAMINOPHEN 325 MG PO TABS
ORAL_TABLET | ORAL | Status: AC
  Filled 2020-04-26: qty 2

## 2020-04-26 NOTE — Patient Instructions (Signed)
[facility] Discharge Instructions for Patients Receiving Chemotherapy  Today you received the following chemotherapy agents: Trastuzumab and Pertuzumab  To help prevent nausea and vomiting after your treatment, we encourage you to take your nausea medication  as prescribed.    If you develop nausea and vomiting that is not controlled by your nausea medication, call the clinic.   BELOW ARE SYMPTOMS THAT SHOULD BE REPORTED IMMEDIATELY:  *FEVER GREATER THAN 100.5 F  *CHILLS WITH OR WITHOUT FEVER  NAUSEA AND VOMITING THAT IS NOT CONTROLLED WITH YOUR NAUSEA MEDICATION  *UNUSUAL SHORTNESS OF BREATH  *UNUSUAL BRUISING OR BLEEDING  TENDERNESS IN MOUTH AND THROAT WITH OR WITHOUT PRESENCE OF ULCERS  *URINARY PROBLEMS  *BOWEL PROBLEMS  UNUSUAL RASH Items with * indicate a potential emergency and should be followed up as soon as possible.  Feel free to call the clinic should you have any questions or concerns. The clinic phone number is [phone].  Please show the CHEMO ALERT CARD at check-in to the Emergency Department and triage nurse.   

## 2020-04-27 ENCOUNTER — Telehealth: Payer: Self-pay | Admitting: Oncology

## 2020-04-27 ENCOUNTER — Ambulatory Visit
Admission: RE | Admit: 2020-04-27 | Discharge: 2020-04-27 | Disposition: A | Discharge status: Home / Self Care | Payer: Managed Care, Other (non HMO) | Source: Ambulatory Visit | Attending: Radiation Oncology | Admitting: Radiation Oncology

## 2020-04-27 ENCOUNTER — Encounter: Payer: Managed Care, Other (non HMO) | Admitting: Physical Therapy

## 2020-04-27 DIAGNOSIS — Z510 Encounter for antineoplastic radiation therapy: Secondary | ICD-10-CM | POA: Diagnosis not present

## 2020-04-27 NOTE — Telephone Encounter (Signed)
Scheduled appts per 9/29 los. Left voicemail with appt dates and times.

## 2020-04-28 ENCOUNTER — Other Ambulatory Visit: Payer: Self-pay

## 2020-04-28 ENCOUNTER — Ambulatory Visit
Admission: RE | Admit: 2020-04-28 | Discharge: 2020-04-28 | Disposition: A | Discharge status: Home / Self Care | Payer: Managed Care, Other (non HMO) | Source: Ambulatory Visit | Attending: Radiation Oncology | Admitting: Radiation Oncology

## 2020-04-28 DIAGNOSIS — R293 Abnormal posture: Secondary | ICD-10-CM | POA: Diagnosis present

## 2020-04-28 DIAGNOSIS — Z170 Estrogen receptor positive status [ER+]: Secondary | ICD-10-CM | POA: Diagnosis present

## 2020-04-28 DIAGNOSIS — C50411 Malignant neoplasm of upper-outer quadrant of right female breast: Secondary | ICD-10-CM | POA: Insufficient documentation

## 2020-04-28 DIAGNOSIS — M25611 Stiffness of right shoulder, not elsewhere classified: Secondary | ICD-10-CM | POA: Diagnosis present

## 2020-04-28 DIAGNOSIS — C50911 Malignant neoplasm of unspecified site of right female breast: Secondary | ICD-10-CM | POA: Diagnosis present

## 2020-05-01 ENCOUNTER — Ambulatory Visit
Admission: RE | Admit: 2020-05-01 | Discharge: 2020-05-01 | Disposition: A | Discharge status: Home / Self Care | Payer: Managed Care, Other (non HMO) | Source: Ambulatory Visit | Attending: Radiation Oncology | Admitting: Radiation Oncology

## 2020-05-01 ENCOUNTER — Ambulatory Visit: Payer: Managed Care, Other (non HMO) | Admitting: Radiation Oncology

## 2020-05-01 DIAGNOSIS — M25611 Stiffness of right shoulder, not elsewhere classified: Secondary | ICD-10-CM | POA: Diagnosis not present

## 2020-05-02 ENCOUNTER — Ambulatory Visit
Admission: RE | Admit: 2020-05-02 | Discharge: 2020-05-02 | Disposition: A | Discharge status: Home / Self Care | Payer: Managed Care, Other (non HMO) | Source: Ambulatory Visit | Attending: Radiation Oncology | Admitting: Radiation Oncology

## 2020-05-02 DIAGNOSIS — M25611 Stiffness of right shoulder, not elsewhere classified: Secondary | ICD-10-CM | POA: Diagnosis not present

## 2020-05-03 ENCOUNTER — Ambulatory Visit
Admission: RE | Admit: 2020-05-03 | Discharge: 2020-05-03 | Disposition: A | Discharge status: Home / Self Care | Payer: Managed Care, Other (non HMO) | Source: Ambulatory Visit | Attending: Radiation Oncology | Admitting: Radiation Oncology

## 2020-05-03 DIAGNOSIS — M25611 Stiffness of right shoulder, not elsewhere classified: Secondary | ICD-10-CM | POA: Diagnosis not present

## 2020-05-04 ENCOUNTER — Other Ambulatory Visit: Payer: Self-pay

## 2020-05-04 ENCOUNTER — Ambulatory Visit
Admission: RE | Admit: 2020-05-04 | Discharge: 2020-05-04 | Disposition: A | Discharge status: Home / Self Care | Payer: Managed Care, Other (non HMO) | Source: Ambulatory Visit | Attending: Radiation Oncology | Admitting: Radiation Oncology

## 2020-05-04 ENCOUNTER — Encounter: Payer: Self-pay | Admitting: Rehabilitation

## 2020-05-04 ENCOUNTER — Ambulatory Visit: Payer: Managed Care, Other (non HMO) | Attending: Surgery | Admitting: Rehabilitation

## 2020-05-04 DIAGNOSIS — Z170 Estrogen receptor positive status [ER+]: Secondary | ICD-10-CM | POA: Insufficient documentation

## 2020-05-04 DIAGNOSIS — M25611 Stiffness of right shoulder, not elsewhere classified: Secondary | ICD-10-CM | POA: Diagnosis not present

## 2020-05-04 DIAGNOSIS — R293 Abnormal posture: Secondary | ICD-10-CM | POA: Insufficient documentation

## 2020-05-04 DIAGNOSIS — C50911 Malignant neoplasm of unspecified site of right female breast: Secondary | ICD-10-CM | POA: Insufficient documentation

## 2020-05-04 NOTE — Therapy (Signed)
[place], [place], [address] Phone: [phone]   Fax:  [phone]  Physical Therapy Treatment  Patient Details  Name: Paige Gilbert MRN: [id number] Date of Birth: 1956/04/25 Referring Provider (PT): [place]   Encounter Date: 05/04/2020   PT End of Session - 05/04/20 1444    Visit Number 9    Number of Visits 14    Date for PT Re-Evaluation 06/05/20    PT Start Time 6962    PT Stop Time 9528   leaving for radiation   PT Time Calculation (min) 39 min    Activity Tolerance Patient tolerated treatment well;Treatment limited secondary to medical complications (Comment)    Behavior During Therapy [facility] for tasks assessed/performed           Past Medical History:  Diagnosis Date  . Anemia   . right breast ca dx'd 06/2019   chemo  . Varicose vein   . Varicose veins of left lower extremity    "bluging ones at top- no pain"    Past Surgical History:  Procedure Laterality Date  . MASTECTOMY MODIFIED RADICAL Right 02/17/2020   Procedure: RIGHT MASTECTOMY MODIFIED RADICAL;  Surgeon: [name], MD;  Location: [place];  Service: General;  Laterality: Right;  . NO PAST SURGERIES    . none    . PORT-A-CATH REMOVAL Right 02/17/2020   Procedure: REMOVAL PORT-A-CATH;  Surgeon: [name], MD;  Location: [place];  Service: General;  Laterality: Right;  . PORTACATH PLACEMENT Right 08/17/2019   Procedure: INSERTION PORT-A-CATH WITH ULTRASOUND;  Surgeon: [name], MD;  Location: [place];  Service: General;  Laterality: Right;  . wisdom teeth removal     remotely.     There were no vitals filed for this visit.   Subjective Assessment - 05/04/20 1408    Subjective I am really red and starting to blisters.    Pertinent History R breast cancer, completed chemotherapy, DCIS grade 2-3, ER+ PR+  pt with right mastectomy 02/16/2200 with 7 of  9 nodes positive .  Pt had drain in about 4 weeks and  developed a cellulitis.  Pt will receive herceptin infusions and radiation    Patient Stated Goals reduce lymphedema risk and learn post op exercises    Currently in Pain? No/denies                             [facility] Adult PT Treatment/Exercise - 05/04/20 0001      Manual Therapy   Edema Management Pt reports she does not want to get a sleeve and will let us know in the future if she wants to get one    Soft tissue mobilization with coconut to the forearm and antecubital fossa with work into extension at the elbow     Myofascial Release to cording in right axilla     Passive ROM to right shoulder to tolerance flexion and abduction                       PT Long Term Goals - 04/24/20 1320      PT LONG TERM GOAL #1   Title Pt will demonstrate she has regained full shoulder ROM and function post operatively compared to baselines.    Baseline 04/24/20- pt has return to baseline ROM    Time 8    Period Weeks    Status Achieved  PT LONG TERM GOAL #2   Title Pt will demonstrate 165 degrees of left shoulder flexion to allow her to reach overhad.    Baseline 110, 04/24/20- 155    Time 5    Period Weeks    Status On-going      PT LONG TERM GOAL #3   Title Pt will demonstrate 165 degrees of right shoulder abduction to allow her to reach out to the side.    Baseline 112, 04/24/20- 178 degrees    Time 5    Period Weeks    Status Achieved      PT LONG TERM GOAL #4   Title Pt will report a 75% improvement in pain and tightness across right chest when turning head to the left to allow improved comfort.    Baseline 04/24/20- 85% improvement    Time 5    Period Weeks    Status Achieved      PT LONG TERM GOAL #5   Title Pt will be independent in a home exercise program for continued strengthening and stretching    Time 5    Period Weeks    Status On-going                 Plan - 05/04/20 1444    Clinical Impression Statement Pt starting to get  some open blistering onthe chest with increased redness now with 1.5 weeks left of radiation.  Performed Rt shoulder PROM and focused on elbow extension vs cording in the axilla due to radiation right after treatment allowing no lotion in the area.  Pt will call back to schedule 2 weeks post radiation for follow up    PT Frequency 1x / week    PT Duration 6 weeks    PT Treatment/Interventions ADLs/Self Care Home Management;Therapeutic exercise;Patient/family education;Therapeutic activities;Manual techniques;Manual lymph drainage;Compression bandaging;Scar mobilization;Passive range of motion;Taping    PT Next Visit Plan recheck post radiation / goals / ROM, etc, continue with MLD and myofascial release to cording PROM to Rt shoulder. pulleys and wall ball    PT Home Exercise Plan post op breast exercises    Recommended Other Services pt would not like to get a sleeve at this time    Consulted and Agree with Plan of Care Patient           Patient will benefit from skilled therapeutic intervention in order to improve the following deficits and impairments:     Visit Diagnosis: Stiffness of right shoulder, not elsewhere classified  Abnormal posture  Malignant neoplasm of right breast in female, estrogen receptor positive, unspecified site of breast [facility])     Problem List Patient Active Problem List   Diagnosis Date Noted  . Right breast cancer with T3 tumor, >5 cm in greatest dimension ([place]) 02/17/2020  . Malignant neoplasm of upper-outer quadrant of right breast in female, estrogen receptor positive ([place]) 08/03/2019  . Plantar fasciitis, bilateral 02/15/2015  . Lumbago 02/15/2015  . Screening 02/15/2015  . Hemisensory loss 06/11/2013  . Weakness 06/11/2013    Stark Bray 05/04/2020, 2:47 PM  [place], [place], [address] Phone: [phone]   Fax:  [phone]  Name: Paige Gilbert MRN:  [id number] Date of Birth: June 10, 1956

## 2020-05-05 ENCOUNTER — Ambulatory Visit
Admission: RE | Admit: 2020-05-05 | Discharge: 2020-05-05 | Disposition: A | Discharge status: Home / Self Care | Payer: Managed Care, Other (non HMO) | Source: Ambulatory Visit | Attending: Radiation Oncology | Admitting: Radiation Oncology

## 2020-05-05 DIAGNOSIS — M25611 Stiffness of right shoulder, not elsewhere classified: Secondary | ICD-10-CM | POA: Diagnosis not present

## 2020-05-08 ENCOUNTER — Encounter: Payer: Self-pay | Admitting: *Deleted

## 2020-05-08 ENCOUNTER — Ambulatory Visit
Admission: RE | Admit: 2020-05-08 | Discharge: 2020-05-08 | Disposition: A | Discharge status: Home / Self Care | Payer: Managed Care, Other (non HMO) | Source: Ambulatory Visit | Attending: Radiation Oncology | Admitting: Radiation Oncology

## 2020-05-08 DIAGNOSIS — M25611 Stiffness of right shoulder, not elsewhere classified: Secondary | ICD-10-CM | POA: Diagnosis not present

## 2020-05-09 ENCOUNTER — Ambulatory Visit
Admission: RE | Admit: 2020-05-09 | Discharge: 2020-05-09 | Disposition: A | Discharge status: Home / Self Care | Payer: Managed Care, Other (non HMO) | Source: Ambulatory Visit | Attending: Radiation Oncology | Admitting: Radiation Oncology

## 2020-05-09 DIAGNOSIS — M25611 Stiffness of right shoulder, not elsewhere classified: Secondary | ICD-10-CM | POA: Diagnosis not present

## 2020-05-10 ENCOUNTER — Ambulatory Visit
Admission: RE | Admit: 2020-05-10 | Discharge: 2020-05-10 | Disposition: A | Discharge status: Home / Self Care | Payer: Managed Care, Other (non HMO) | Source: Ambulatory Visit | Attending: Radiation Oncology | Admitting: Radiation Oncology

## 2020-05-10 DIAGNOSIS — M25611 Stiffness of right shoulder, not elsewhere classified: Secondary | ICD-10-CM | POA: Diagnosis not present

## 2020-05-11 ENCOUNTER — Ambulatory Visit
Admission: RE | Admit: 2020-05-11 | Discharge: 2020-05-11 | Disposition: A | Discharge status: Home / Self Care | Payer: Managed Care, Other (non HMO) | Source: Ambulatory Visit | Attending: Radiation Oncology | Admitting: Radiation Oncology

## 2020-05-11 ENCOUNTER — Ambulatory Visit: Payer: Managed Care, Other (non HMO)

## 2020-05-11 DIAGNOSIS — M25611 Stiffness of right shoulder, not elsewhere classified: Secondary | ICD-10-CM | POA: Diagnosis not present

## 2020-05-12 ENCOUNTER — Ambulatory Visit
Admission: RE | Admit: 2020-05-12 | Discharge: 2020-05-12 | Disposition: A | Discharge status: Home / Self Care | Payer: Managed Care, Other (non HMO) | Source: Ambulatory Visit | Attending: Radiation Oncology | Admitting: Radiation Oncology

## 2020-05-12 DIAGNOSIS — M25611 Stiffness of right shoulder, not elsewhere classified: Secondary | ICD-10-CM | POA: Diagnosis not present

## 2020-05-15 ENCOUNTER — Encounter: Payer: Self-pay | Admitting: *Deleted

## 2020-05-15 ENCOUNTER — Telehealth: Payer: Self-pay

## 2020-05-15 ENCOUNTER — Ambulatory Visit
Admission: RE | Admit: 2020-05-15 | Discharge: 2020-05-15 | Disposition: A | Discharge status: Home / Self Care | Payer: Managed Care, Other (non HMO) | Source: Ambulatory Visit | Attending: Radiation Oncology | Admitting: Radiation Oncology

## 2020-05-15 ENCOUNTER — Encounter: Payer: Self-pay | Admitting: Radiation Oncology

## 2020-05-15 DIAGNOSIS — M25611 Stiffness of right shoulder, not elsewhere classified: Secondary | ICD-10-CM | POA: Diagnosis not present

## 2020-05-15 NOTE — Telephone Encounter (Signed)
Pt called stating she needs Bone Density Scan before November appt with Dr Jana Hakim. This LPN printed orders and faxed to [facility] per Dr magrinat's note [phone]. Fax confirmation received and pt is aware. Pt knows to call [facility] if she has not heard from them in 2 business days.

## 2020-05-17 ENCOUNTER — Other Ambulatory Visit: Payer: Self-pay

## 2020-05-17 ENCOUNTER — Inpatient Hospital Stay: Payer: Managed Care, Other (non HMO) | Attending: Oncology

## 2020-05-17 ENCOUNTER — Inpatient Hospital Stay: Payer: Managed Care, Other (non HMO)

## 2020-05-17 VITALS — BP 120/62 | HR 70 | Temp 97.6°F | Resp 18 | Wt 139.5 lb

## 2020-05-17 DIAGNOSIS — C50911 Malignant neoplasm of unspecified site of right female breast: Secondary | ICD-10-CM

## 2020-05-17 DIAGNOSIS — C50411 Malignant neoplasm of upper-outer quadrant of right female breast: Secondary | ICD-10-CM

## 2020-05-17 DIAGNOSIS — Z5112 Encounter for antineoplastic immunotherapy: Secondary | ICD-10-CM | POA: Diagnosis not present

## 2020-05-17 DIAGNOSIS — Z170 Estrogen receptor positive status [ER+]: Secondary | ICD-10-CM

## 2020-05-17 LAB — COMPREHENSIVE METABOLIC PANEL
ALT: 25 U/L (ref 0–44)
AST: 20 U/L (ref 15–41)
Albumin: 3.6 g/dL (ref 3.5–5.0)
Alkaline Phosphatase: 77 U/L (ref 38–126)
Anion gap: 9 (ref 5–15)
BUN: 15 mg/dL (ref 8–23)
CO2: 23 mmol/L (ref 22–32)
Calcium: 9.3 mg/dL (ref 8.9–10.3)
Chloride: 108 mmol/L (ref 98–111)
Creatinine, Ser: 0.75 mg/dL (ref 0.44–1.00)
GFR, Estimated: >60 mL/min (ref >60)
Glucose, Bld: 104 mg/dL — ABNORMAL HIGH (ref 70–99)
Potassium: 4.1 mmol/L (ref 3.5–5.1)
Sodium: 140 mmol/L (ref 135–145)
Total Bilirubin: 0.3 mg/dL (ref 0.3–1.2)
Total Protein: 6.5 g/dL (ref 6.5–8.1)

## 2020-05-17 LAB — CBC WITH DIFFERENTIAL/PLATELET
Abs Immature Granulocytes: 0.01 10*3/uL (ref 0.00–0.07)
Basophils Absolute: 0 10*3/uL (ref 0.0–0.1)
Basophils Relative: 1 %
Eosinophils Absolute: 0.1 10*3/uL (ref 0.0–0.5)
Eosinophils Relative: 2 %
HCT: 36.9 % (ref 36.0–46.0)
Hemoglobin: 12.7 g/dL (ref 12.0–15.0)
Immature Granulocytes: 0 %
Lymphocytes Relative: 20 %
Lymphs Abs: 0.9 10*3/uL (ref 0.7–4.0)
MCH: 28.7 pg (ref 26.0–34.0)
MCHC: 34.4 g/dL (ref 30.0–36.0)
MCV: 83.3 fL (ref 80.0–100.0)
Monocytes Absolute: 0.3 10*3/uL (ref 0.1–1.0)
Monocytes Relative: 7 %
Neutro Abs: 3.1 10*3/uL (ref 1.7–7.7)
Neutrophils Relative %: 70 %
Platelets: 133 10*3/uL — ABNORMAL LOW (ref 150–400)
RBC: 4.43 MIL/uL (ref 3.87–5.11)
RDW: 13.1 % (ref 11.5–15.5)
WBC: 4.4 10*3/uL (ref 4.0–10.5)
nRBC: 0 % (ref 0.0–0.2)

## 2020-05-17 MED ORDER — TRASTUZUMAB-DKST CHEMO 150 MG IV SOLR
6.0000 mg/kg | Freq: Once | INTRAVENOUS | Status: AC
  Administered 2020-05-17: 378 mg via INTRAVENOUS
  Filled 2020-05-17: qty 18

## 2020-05-17 MED ORDER — SODIUM CHLORIDE 0.9 % IV SOLN
420.0000 mg | Freq: Once | INTRAVENOUS | Status: AC
  Administered 2020-05-17: 420 mg via INTRAVENOUS
  Filled 2020-05-17: qty 14

## 2020-05-17 MED ORDER — DIPHENHYDRAMINE HCL 25 MG PO CAPS
25.0000 mg | ORAL_CAPSULE | Freq: Once | ORAL | Status: AC
  Administered 2020-05-17: 25 mg via ORAL

## 2020-05-17 MED ORDER — ACETAMINOPHEN 325 MG PO TABS
ORAL_TABLET | ORAL | Status: AC
  Filled 2020-05-17: qty 2

## 2020-05-17 MED ORDER — SODIUM CHLORIDE 0.9 % IV SOLN
Freq: Once | INTRAVENOUS | Status: AC
  Filled 2020-05-17: qty 250

## 2020-05-17 MED ORDER — ACETAMINOPHEN 325 MG PO TABS
650.0000 mg | ORAL_TABLET | Freq: Once | ORAL | Status: AC
  Administered 2020-05-17: 650 mg via ORAL

## 2020-05-17 MED ORDER — DIPHENHYDRAMINE HCL 25 MG PO CAPS
ORAL_CAPSULE | ORAL | Status: AC
  Filled 2020-05-17: qty 1

## 2020-05-17 NOTE — Patient Instructions (Signed)
[facility] Discharge Instructions for Patients Receiving Chemotherapy  Today you received the following chemotherapy agents: trastuzumab/pertuzumab.  To help prevent nausea and vomiting after your treatment, we encourage you to take your nausea medication as directed.   If you develop nausea and vomiting that is not controlled by your nausea medication, call the clinic.   BELOW ARE SYMPTOMS THAT SHOULD BE REPORTED IMMEDIATELY:  *FEVER GREATER THAN 100.5 F  *CHILLS WITH OR WITHOUT FEVER  NAUSEA AND VOMITING THAT IS NOT CONTROLLED WITH YOUR NAUSEA MEDICATION  *UNUSUAL SHORTNESS OF BREATH  *UNUSUAL BRUISING OR BLEEDING  TENDERNESS IN MOUTH AND THROAT WITH OR WITHOUT PRESENCE OF ULCERS  *URINARY PROBLEMS  *BOWEL PROBLEMS  UNUSUAL RASH Items with * indicate a potential emergency and should be followed up as soon as possible.  Feel free to call the clinic should you have any questions or concerns. The clinic phone number is [phone].  Please show the CHEMO ALERT CARD at check-in to the Emergency Department and triage nurse.   

## 2020-05-22 ENCOUNTER — Ambulatory Visit: Payer: Managed Care, Other (non HMO)

## 2020-06-05 ENCOUNTER — Ambulatory Visit: Payer: Managed Care, Other (non HMO) | Attending: Surgery

## 2020-06-05 ENCOUNTER — Other Ambulatory Visit: Payer: Self-pay

## 2020-06-05 DIAGNOSIS — C50911 Malignant neoplasm of unspecified site of right female breast: Secondary | ICD-10-CM

## 2020-06-05 DIAGNOSIS — Z170 Estrogen receptor positive status [ER+]: Secondary | ICD-10-CM | POA: Insufficient documentation

## 2020-06-05 NOTE — Therapy (Signed)
[place], [place], [address] Phone: [phone]   Fax:  [phone]  Physical Therapy Treatment  Patient Details  Name: Paige Gilbert MRN: [id number] Date of Birth: Mar 28, 1956 Referring Provider (PT): Cornett   Encounter Date: 06/05/2020   PT End of Session - 06/05/20 1605    Visit Number 9   # unchanged due to screen only   Number of Visits 14    Date for PT Re-Evaluation 06/05/20    PT Start Time 1532    PT Stop Time 1550    PT Time Calculation (min) 18 min    Activity Tolerance Patient tolerated treatment well    Behavior During Therapy [facility] for tasks assessed/performed           Past Medical History:  Diagnosis Date  . Anemia   . right breast ca dx'd 06/2019   chemo  . Varicose vein   . Varicose veins of left lower extremity    "bluging ones at top- no pain"    Past Surgical History:  Procedure Laterality Date  . MASTECTOMY MODIFIED RADICAL Right 02/17/2020   Procedure: RIGHT MASTECTOMY MODIFIED RADICAL;  Surgeon: [name], MD;  Location: [place];  Service: General;  Laterality: Right;  . NO PAST SURGERIES    . none    . PORT-A-CATH REMOVAL Right 02/17/2020   Procedure: REMOVAL PORT-A-CATH;  Surgeon: [name], MD;  Location: [facility];  Service: General;  Laterality: Right;  . PORTACATH PLACEMENT Right 08/17/2019   Procedure: INSERTION PORT-A-CATH WITH ULTRASOUND;  Surgeon: [name], MD;  Location: [facility];  Service: General;  Laterality: Right;  . wisdom teeth removal     remotely.     There were no vitals filed for this visit.   Subjective Assessment - 06/05/20 1533    Subjective Pt returns for her 3 month L-Dex                  L-DEX FLOWSHEETS - 06/05/20 1500      L-DEX LYMPHEDEMA SCREENING   BASELINE SCORE (UNILATERAL) -5.8    L-DEX SCORE (UNILATERAL) 11.6    VALUE CHANGE (UNILAT) 17.4                                   PT Long Term Goals - 04/24/20 1320      PT LONG TERM GOAL #1   Title Pt will demonstrate she has regained full shoulder ROM and function post operatively compared to baselines.    Baseline 04/24/20- pt has return to baseline ROM    Time 8    Period Weeks    Status Achieved      PT LONG TERM GOAL #2   Title Pt will demonstrate 165 degrees of left shoulder flexion to allow her to reach overhad.    Baseline 110, 04/24/20- 155    Time 5    Period Weeks    Status On-going      PT LONG TERM GOAL #3   Title Pt will demonstrate 165 degrees of right shoulder abduction to allow her to reach out to the side.    Baseline 112, 04/24/20- 178 degrees    Time 5    Period Weeks    Status Achieved      PT LONG TERM GOAL #4   Title Pt will report a 75% improvement in pain and tightness across right chest when turning  head to the left to allow improved comfort.    Baseline 04/24/20- 85% improvement    Time 5    Period Weeks    Status Achieved      PT LONG TERM GOAL #5   Title Pt will be independent in a home exercise program for continued strengthening and stretching    Time 5    Period Weeks    Status On-going                 Plan - 06/05/20 1605    Clinical Impression Statement Pt returns for 3 month L-Dex screen. Her change from baseline was 17.4 indicating subclinical lymphedema. We did not have the correct size of compression sleeve on hand at clinic so pt issued script for compression sleeve and gauntlet and to call [facility] to be measured ASAP when they open tomorrow. Pt was R/S to return in 1 month instructing her to wear sleeve every day for next month. She verbalized understanding all.    PT Next Visit Plan Pt to call [facility] to get a compression sleeve and gauntlet and begin wearing daily; she is to return in 1 month for another L-Dex screen.    Consulted and Agree with Plan of Care Patient           Patient will benefit from skilled therapeutic intervention in  order to improve the following deficits and impairments:     Visit Diagnosis: Malignant neoplasm of right breast in female, estrogen receptor positive, unspecified site of breast [facility])     Problem List Patient Active Problem List   Diagnosis Date Noted  . Right breast cancer with T3 tumor, >5 cm in greatest dimension ([place]) 02/17/2020  . Malignant neoplasm of upper-outer quadrant of right breast in female, estrogen receptor positive ([place]) 08/03/2019  . Plantar fasciitis, bilateral 02/15/2015  . Lumbago 02/15/2015  . Screening 02/15/2015  . Hemisensory loss 06/11/2013  . Weakness 06/11/2013    Otelia Limes, PTA 06/05/2020, 4:08 PM  [place], [place], [address] Phone: [phone]   Fax:  [phone]  Name: Aneliz Carbary MRN: [id number] Date of Birth: 1956/02/28

## 2020-06-06 ENCOUNTER — Encounter: Payer: Self-pay | Admitting: *Deleted

## 2020-06-06 NOTE — Progress Notes (Signed)
[facility]  [phone] [phone]     ID: Paige Gilbert DOB: May 03, 1956  MR#: [id number]  IWL#:[id number]  Patient Care Team: [name], NP as PCP - General (Family Medicine) [name], RN as Oncology Nurse Navigator [name], RN as Oncology Nurse Navigator Blessed Girdner, [name], MD as Consulting Physician (Oncology) [name], MD as Attending Physician (Radiation Oncology) [name], MD as Consulting Physician (General Surgery) Larey Dresser, MD as Consulting Physician (Cardiology) [name], MD OTHER MD:  CHIEF COMPLAINT: Locally advanced estrogen receptor positive breast cancer (s/p right mastectomy)  CURRENT TREATMENT: Herceptin, Perjeta   INTERVAL HISTORY: Laniya returns today for follow up of her locally advanced breast cancer.  Since her last visit, she completed radiation therapy on 05/15/2020 under Dr. Isidore Moos. She says it got, and she has a little bit more energy. She still not back to normal of course.  She transitioned to herceptin and perjeta on 03/15/2020. Today is day 1 cycle 5. She is having 2-3 bowel movements a day which is normal for her, possibly a little bit more loose than usual. She is not taking Imodium or any other medication to deal with this.  Her most recent echocardiogram on 03/10/2020 showed an ejection fraction of 60-65%.  She does not have a follow-up echo scheduled.  [facility] density at [facility] obtained 2 weeks ago showed a T score of -2.5.   REVIEW OF SYSTEMS: Nneoma tells me when she gets to bed at night her right leg bothers her. It does not seem to bother her so much when she is walking. She has right knee problems as well. She saw physical therapy and with very careful measurement was found to have very minimal right upper extremity lymphedema. She is wearing a crude sleeve right now but has ordered better ones which are calming. Aside from that a detailed review of  systems today was stable.   COVID 19 VACCINATION STATUS: fully vaccinated Therapist, music), with booster pending   HISTORY OF CURRENT ILLNESS: From the original intake note:  Remona Boom has noted a difference between her right and left breast for several years.  She last had a mammogram she thinks about 7 or 8 years ago.  More recently it seemed to her that her right breast was becoming more firm.  She brought it to medical attention and underwent bilateral diagnostic mammography with tomography and right breast ultrason[facility] on 07/07/2019 showing: heterogeneously dense breast tissue; right nipple retraction; architectural distortion with microcalcifications and irregular dense tissue in the upper-outer quadrant; at least 3 abnormal right axillary lymph nodes.  Accordingly on 07/13/2019 she proceeded to biopsy of the right breast area in question. The pathology from this procedure ([id number]) showed:   1. Right Breast, upper-outer quadrant, closer to the nipple  - invasive ductal carcinoma with micropapillary features, grade 2  - ductal carcinoma in situ, grade 2-3, solid and cribriform pattern with comedonecrosis  - Prognostic indicators significant for: estrogen receptor, 100% positive with strong staining intensity and progesterone receptor, 0% negative. HER2 equivocal by immunohistochemistry (2+), but negative by fluorescent in situ hybridization with a signals ratio 1.5 and number per cell 3.8.  2. Right Breast, upper-outer quadrant, axillary tail  - invasive ductal carcinoma, grade 2  - ductal carcinoma in situ, grade 2-3  - Prognostic indicators significant for: estrogen receptor, 100% positive and progesterone receptor, 50% positive, both with strong staining intensity. HER2 equivocal by immunohistochemistry (2+), but negative by  fluorescent in situ hybridization with a signals ratio 1.9 and number per cell 5.3.  The patient's subsequent history is as detailed  below.   PAST MEDICAL HISTORY: Past Medical History:  Diagnosis Date  . Anemia   . right breast ca dx'd 06/2019   chemo  . Varicose vein   . Varicose veins of left lower extremity    "bluging ones at top- no pain"    PAST SURGICAL HISTORY: Past Surgical History:  Procedure Laterality Date  . MASTECTOMY MODIFIED RADICAL Right 02/17/2020   Procedure: RIGHT MASTECTOMY MODIFIED RADICAL;  Surgeon: [name], MD;  Location: [facility];  Service: General;  Laterality: Right;  . NO PAST SURGERIES    . none    . PORT-A-CATH REMOVAL Right 02/17/2020   Procedure: REMOVAL PORT-A-CATH;  Surgeon: [name], MD;  Location: [facility];  Service: General;  Laterality: Right;  . PORTACATH PLACEMENT Right 08/17/2019   Procedure: INSERTION PORT-A-CATH WITH ULTRASOUND;  Surgeon: [name], MD;  Location: [facility];  Service: General;  Laterality: Right;  . wisdom teeth removal     remotely.     FAMILY HISTORY: Family History  Problem Relation Age of Onset  . Diabetes Father   . Blindness Mother   . Congestive Heart Failure Sister   . Gallbladder disease Sister        removed  . Diabetes Sister   . Colitis Sister    The patient's father died at age 36 from a stroke.  The patient's mother died from multiple medical problems not related to cancer at age 75.  The patient had 4 brothers, 7 sisters.  There is no breast ovarian or prostate cancer in the family to her knowledge.  She had one cousin with pancreatic cancer.   GYNECOLOGIC HISTORY:  No LMP recorded. Patient is postmenopausal. Menarche: 64 years old Age at first live birth: 64 years old [place] P 5 LMP 28 Contraceptive: Brief use of oral contraceptives as a teenager HRT no Hysterectomy?  No BSO?    SOCIAL HISTORY: (updated 07/2019)  Rily works in a Engineer, agricultural, and Education administrator houses.  Her husband of 37 years [name] is a Chief Operating Officer.  Their children are [name], 29, who lives in [place] and is a Veterinary surgeon in  the [employer]; [name] 33 who works as a Designer, jewellery at [employer]; [name] who lives in [place], and is not currently employed; Groveton lives in [place], and [name] who is an Armed forces training and education officer in [place].  The patient has 4 grandchildren all under the age of 65    ADVANCED DIRECTIVES: Husband [name] is her HCPOA.   HEALTH MAINTENANCE: Social History   Tobacco Use  . Smoking status: Never Smoker  . Smokeless tobacco: Never Used  Vaping Use  . Vaping Use: Never used  Substance Use Topics  . Alcohol use: Yes    Comment: rarely  . Drug use: No     Colonoscopy: never done  PAP: Remote  Bone density: Never   No Known Allergies  Current Outpatient Medications  Medication Sig Dispense Refill  . Calcium Citrate-Vitamin D (CALCIUM CITRATE + D PO) Take 1 tablet by mouth 3 (three) times a week.     Marland Kitchen ibuprofen (ADVIL) 800 MG tablet Take 1 tablet (800 mg total) by mouth every 8 (eight) hours as needed. 30 tablet 0  . ibuprofen (ADVIL) 800 MG tablet Take 1 tablet (800 mg total) by mouth every 8 (eight) hours as needed. 30 tablet 0  .  naproxen sodium (ALEVE) 220 MG tablet Take 220 mg by mouth daily as needed (pain).     Marland Kitchen oxyCODONE (OXY IR/ROXICODONE) 5 MG immediate release tablet Take 1 tablet (5 mg total) by mouth every 6 (six) hours as needed for severe pain. 15 tablet 0  . tamoxifen (NOLVADEX) 20 MG tablet Take 1 tablet (20 mg total) by mouth daily. 90 tablet 12   No current facility-administered medications for this visit.   Facility-Administered Medications Ordered in Other Visits  Medication Dose Route Frequency Provider Last Rate Last Admin  . heparin lock flush 100 unit/mL  500 Units Intracatheter Once PRN Jamontae Thwaites, [name], MD      . sodium chloride flush (NS) 0.9 % injection 10 mL  10 mL Intracatheter PRN Daesia Zylka, [name], MD        OBJECTIVE: White woman who appears younger than stated age 49:   06/07/20 0900  BP: 139/75  Pulse: 69   Resp: 18  Temp: (!) 97.4 F (36.3 C)  SpO2: 100%     Body mass index is 23.45 kg/m.   Wt Readings from Last 3 Encounters:  06/07/20 136 lb 9.6 oz (62 kg)  05/17/20 139 lb 8 oz (63.3 kg)  04/26/20 142 lb 3.2 oz (64.5 kg)  ECOG FS:1 - Symptomatic but completely ambulatory  Sclerae unicteric, EOMs intact Wearing a mask No cervical or supraclavicular adenopathy Lungs no rales or rhonchi Heart regular rate and rhythm Abd soft, nontender, positive bowel sounds MSK no focal spinal tenderness, no upper extremity lymphedema Neuro: nonfocal, well oriented, appropriate affect Breasts: The right breast is status post mastectomy and radiation. There is still hyperpigmentation and some dry skin but no desquamation. Left breast is benign; both axillae are benign.   LAB RESULTS:  CMP     Component Value Date/Time   NA 141 06/07/2020 0832   K 3.9 06/07/2020 0832   CL 108 06/07/2020 0832   CO2 25 06/07/2020 0832   GLUCOSE 95 06/07/2020 0832   BUN 15 06/07/2020 0832   CREATININE 0.79 06/07/2020 0832   CREATININE 0.79 08/04/2019 1546   CREATININE 0.76 03/27/2015 1146   CALCIUM 9.2 06/07/2020 0832   PROT 6.6 06/07/2020 0832   ALBUMIN 3.9 06/07/2020 0832   AST 20 06/07/2020 0832   AST 17 08/04/2019 1546   ALT 27 06/07/2020 0832   ALT 21 08/04/2019 1546   ALKPHOS 70 06/07/2020 0832   BILITOT 0.5 06/07/2020 0832   BILITOT 0.2 (L) 08/04/2019 1546   GFRNONAA >60 06/07/2020 0832   GFRNONAA >60 08/04/2019 1546   GFRNONAA 87 03/27/2015 1146   GFRAA >60 04/26/2020 1213   GFRAA >60 08/04/2019 1546   GFRAA >89 03/27/2015 1146    No results found for: TOTALPROTELP, ALBUMINELP, A1GS, A2GS, BETS, BETA2SER, GAMS, MSPIKE, SPEI  Lab Results  Component Value Date   WBC 3.6 (L) 06/07/2020   NEUTROABS 2.1 06/07/2020   HGB 12.7 06/07/2020   HCT 37.1 06/07/2020   MCV 83.9 06/07/2020   PLT 128 (L) 06/07/2020    No results found for: LABCA2  No components found for: PVXYIA165  No results  for input(s): INR in the last 168 hours.  No results found for: LABCA2  No results found for: VVZ482  No results found for: LMB867  No results found for: JQG920  Lab Results  Component Value Date   CA2729 27.4 08/18/2019    No components found for: HGQUANT  No results found for: CEA1 / No results found for: CEA1  No results found for: AFPTUMOR  No results found for: CHROMOGRNA  No results found for: KPAFRELGTCHN, LAMBDASER, KAPLAMBRATIO (kappa/lambda light chains)  No results found for: HGBA, HGBA2QUANT, HGBFQUANT, HGBSQUAN (Hemoglobinopathy evaluation)   No results found for: LDH  Lab Results  Component Value Date   IRON 94 02/15/2015   TIBC 272 02/15/2015   IRONPCTSAT 35 02/15/2015   (Iron and TIBC)  Lab Results  Component Value Date   FERRITIN 70 02/15/2015    Urinalysis No results found for: COLORURINE, APPEARANCEUR, LABSPEC, PHURINE, GLUCOSEU, HGBUR, BILIRUBINUR, KETONESUR, PROTEINUR, UROBILINOGEN, NITRITE, LEUKOCYTESUR   STUDIES: No results found.   ELIGIBLE FOR AVAILABLE RESEARCH PROTOCOL:   ASSESSMENT: 64 y.o. [place], [place] woman status post right breast biopsy x2 on 07/13/2019 for a clinical T3-T4 N1-2, stage III invasive ductal carcinoma, grade 2, estrogen receptor positive, progesterone receptor variable, HER-2 not amplified.  (a) breast MRI 08/15/2019 showed a 7.6 area of enhancement, and 5 suspicious right axillary lymph nodes as well as two 0.5 mm additional masses in the inferior right breast   (b) CT chest 08/10/2019 showed no evidence of metastatic disease  (c) bone scan 08/27/2019 showed a single focus of activity at T9,  (d) thoracic spine MRI 09/13/2019 showed degenerative changes, no evidence of metastatic disease  (1) neoadjuvant chemotherapy consisting of cyclophosphamide and doxorubicin in dose dense fashion x4 starting 08/18/2019, completed 09/28/2019, followed by weekly paclitaxel x12 starting 10/12/2019, completed  01/11/2020  (2) status post right modified radical mastectomy 02/17/2020 for a residual ypT3 ypN2 invasive ductal carcinoma, with negative margins, estrogen and progesterone receptor positive, and now also HER-2 positive with a ratio of 2.98, number per cell 7.08  (3) adjuvant radiation to be completed 05/15/2020  (4) tamoxifen started 06/07/2020  (5) trastuzumab/pertuzumab started 03/15/2020, to continue for 1 year  (a) echocardiogram 03/10/2020 shows an ejection fraction in the 60-65% range   PLAN: Myleigh is done with local treatment for her breast cancer. She is now ready for antiestrogens. I gave her information on on anastrozole and tamoxifen. We discussed the results of her bone density which do show osteoporosis.  Chiefly based on that we decided we would go with tamoxifen. She has a good understanding of the possible toxicities side effects and complications of this agent including the rare cases of endometrial carcinoma and blood clots  I am going to see her in 3 weeks with her next anti-HER-2 immunotherapy dose just to make sure she is tolerating tamoxifen well. She knows to call for any other issue that may develop before the next visit  Total encounter time 25 minutes.Sarajane Jews C. Maudene Stotler, MD 06/07/20 3:24 PM Medical Oncology and Hematology [facility] [place], [place] [address] Tel. [phone]    Fax. [phone]   I, Wilburn Mylar, am acting as scribe for Dr. [name]. Atharva Mirsky.  I, Lurline Del MD, have reviewed the above documentation for accuracy and completeness, and I agree with the above.   *Total Encounter Time as defined by the Centers for Medicare and Medicaid Services includes, in addition to the face-to-face time of a patient visit (documented in the note above) non-face-to-face time: obtaining and reviewing outside history, ordering and reviewing medications, tests or procedures, care coordination (communications  with other health care professionals or caregivers) and documentation in the medical record.

## 2020-06-07 ENCOUNTER — Other Ambulatory Visit: Payer: Self-pay

## 2020-06-07 ENCOUNTER — Inpatient Hospital Stay: Payer: Managed Care, Other (non HMO) | Attending: Oncology

## 2020-06-07 ENCOUNTER — Inpatient Hospital Stay (HOSPITAL_BASED_OUTPATIENT_CLINIC_OR_DEPARTMENT_OTHER): Payer: Managed Care, Other (non HMO) | Admitting: Oncology

## 2020-06-07 ENCOUNTER — Other Ambulatory Visit: Payer: Self-pay | Admitting: Oncology

## 2020-06-07 ENCOUNTER — Inpatient Hospital Stay: Payer: Managed Care, Other (non HMO)

## 2020-06-07 VITALS — BP 139/75 | HR 69 | Temp 97.4°F | Resp 18 | Ht 64.0 in | Wt 136.6 lb

## 2020-06-07 VITALS — BP 115/67 | HR 68 | Temp 98.0°F | Resp 18

## 2020-06-07 DIAGNOSIS — C50911 Malignant neoplasm of unspecified site of right female breast: Secondary | ICD-10-CM | POA: Diagnosis not present

## 2020-06-07 DIAGNOSIS — Z170 Estrogen receptor positive status [ER+]: Secondary | ICD-10-CM

## 2020-06-07 DIAGNOSIS — C50411 Malignant neoplasm of upper-outer quadrant of right female breast: Secondary | ICD-10-CM

## 2020-06-07 DIAGNOSIS — Z5112 Encounter for antineoplastic immunotherapy: Secondary | ICD-10-CM | POA: Diagnosis present

## 2020-06-07 DIAGNOSIS — M810 Age-related osteoporosis without current pathological fracture: Secondary | ICD-10-CM | POA: Diagnosis not present

## 2020-06-07 LAB — CBC WITH DIFFERENTIAL/PLATELET
Abs Immature Granulocytes: 0.01 10*3/uL (ref 0.00–0.07)
Basophils Absolute: 0 10*3/uL (ref 0.0–0.1)
Basophils Relative: 1 %
Eosinophils Absolute: 0.1 10*3/uL (ref 0.0–0.5)
Eosinophils Relative: 2 %
HCT: 37.1 % (ref 36.0–46.0)
Hemoglobin: 12.7 g/dL (ref 12.0–15.0)
Immature Granulocytes: 0 %
Lymphocytes Relative: 31 %
Lymphs Abs: 1.1 10*3/uL (ref 0.7–4.0)
MCH: 28.7 pg (ref 26.0–34.0)
MCHC: 34.2 g/dL (ref 30.0–36.0)
MCV: 83.9 fL (ref 80.0–100.0)
Monocytes Absolute: 0.3 10*3/uL (ref 0.1–1.0)
Monocytes Relative: 7 %
Neutro Abs: 2.1 10*3/uL (ref 1.7–7.7)
Neutrophils Relative %: 59 %
Platelets: 128 10*3/uL — ABNORMAL LOW (ref 150–400)
RBC: 4.42 MIL/uL (ref 3.87–5.11)
RDW: 13.3 % (ref 11.5–15.5)
WBC: 3.6 10*3/uL — ABNORMAL LOW (ref 4.0–10.5)
nRBC: 0 % (ref 0.0–0.2)

## 2020-06-07 LAB — COMPREHENSIVE METABOLIC PANEL
ALT: 27 U/L (ref 0–44)
AST: 20 U/L (ref 15–41)
Albumin: 3.9 g/dL (ref 3.5–5.0)
Alkaline Phosphatase: 70 U/L (ref 38–126)
Anion gap: 8 (ref 5–15)
BUN: 15 mg/dL (ref 8–23)
CO2: 25 mmol/L (ref 22–32)
Calcium: 9.2 mg/dL (ref 8.9–10.3)
Chloride: 108 mmol/L (ref 98–111)
Creatinine, Ser: 0.79 mg/dL (ref 0.44–1.00)
GFR, Estimated: >60 mL/min (ref >60)
Glucose, Bld: 95 mg/dL (ref 70–99)
Potassium: 3.9 mmol/L (ref 3.5–5.1)
Sodium: 141 mmol/L (ref 135–145)
Total Bilirubin: 0.5 mg/dL (ref 0.3–1.2)
Total Protein: 6.6 g/dL (ref 6.5–8.1)

## 2020-06-07 MED ORDER — TRASTUZUMAB-DKST CHEMO 150 MG IV SOLR
6.0000 mg/kg | Freq: Once | INTRAVENOUS | Status: AC
  Administered 2020-06-07: 378 mg via INTRAVENOUS
  Filled 2020-06-07: qty 18

## 2020-06-07 MED ORDER — SODIUM CHLORIDE 0.9% FLUSH
10.0000 mL | INTRAVENOUS | Status: DC | PRN
  Filled 2020-06-07: qty 10

## 2020-06-07 MED ORDER — ACETAMINOPHEN 325 MG PO TABS
ORAL_TABLET | ORAL | Status: AC
  Filled 2020-06-07: qty 2

## 2020-06-07 MED ORDER — HEPARIN SOD (PORK) LOCK FLUSH 100 UNIT/ML IV SOLN
500.0000 [IU] | Freq: Once | INTRAVENOUS | Status: DC | PRN
  Filled 2020-06-07: qty 5

## 2020-06-07 MED ORDER — DIPHENHYDRAMINE HCL 25 MG PO CAPS
25.0000 mg | ORAL_CAPSULE | Freq: Once | ORAL | Status: AC
  Administered 2020-06-07: 25 mg via ORAL

## 2020-06-07 MED ORDER — SODIUM CHLORIDE 0.9 % IV SOLN
Freq: Once | INTRAVENOUS | Status: AC
  Filled 2020-06-07: qty 250

## 2020-06-07 MED ORDER — TAMOXIFEN CITRATE 20 MG PO TABS: 20.0000 mg | ORAL_TABLET | Freq: Every day | ORAL | 12 refills | Status: AC

## 2020-06-07 MED ORDER — SODIUM CHLORIDE 0.9 % IV SOLN
420.0000 mg | Freq: Once | INTRAVENOUS | Status: AC
  Administered 2020-06-07: 420 mg via INTRAVENOUS
  Filled 2020-06-07: qty 14

## 2020-06-07 MED ORDER — DIPHENHYDRAMINE HCL 25 MG PO CAPS
ORAL_CAPSULE | ORAL | Status: AC
  Filled 2020-06-07: qty 1

## 2020-06-07 MED ORDER — ACETAMINOPHEN 325 MG PO TABS
650.0000 mg | ORAL_TABLET | Freq: Once | ORAL | Status: AC
  Administered 2020-06-07: 650 mg via ORAL

## 2020-06-07 MED ORDER — ANASTROZOLE 1 MG PO TABS: 1.0000 mg | ORAL_TABLET | Freq: Every day | ORAL | 4 refills | Status: DC

## 2020-06-07 NOTE — Patient Instructions (Signed)
[facility] Discharge Instructions for Patients Receiving Chemotherapy  Today you received the following chemotherapy agents: trastuzumab/pertuzumab.  To help prevent nausea and vomiting after your treatment, we encourage you to take your nausea medication as directed.   If you develop nausea and vomiting that is not controlled by your nausea medication, call the clinic.   BELOW ARE SYMPTOMS THAT SHOULD BE REPORTED IMMEDIATELY:  *FEVER GREATER THAN 100.5 F  *CHILLS WITH OR WITHOUT FEVER  NAUSEA AND VOMITING THAT IS NOT CONTROLLED WITH YOUR NAUSEA MEDICATION  *UNUSUAL SHORTNESS OF BREATH  *UNUSUAL BRUISING OR BLEEDING  TENDERNESS IN MOUTH AND THROAT WITH OR WITHOUT PRESENCE OF ULCERS  *URINARY PROBLEMS  *BOWEL PROBLEMS  UNUSUAL RASH Items with * indicate a potential emergency and should be followed up as soon as possible.  Feel free to call the clinic should you have any questions or concerns. The clinic phone number is [phone].  Please show the CHEMO ALERT CARD at check-in to the Emergency Department and triage nurse.   

## 2020-06-07 NOTE — Progress Notes (Signed)
Pt declined to wait for 30 min post infusion of perjeta.  Tolerated well. VS stable.

## 2020-06-16 ENCOUNTER — Ambulatory Visit: Payer: Managed Care, Other (non HMO) | Admitting: Radiation Oncology

## 2020-06-16 ENCOUNTER — Telehealth: Payer: Self-pay | Admitting: *Deleted

## 2020-06-16 NOTE — Telephone Encounter (Signed)
Patient called to reschedule follow up appointment  from 06/16/20 to 08/04/20 to 3:20 p.m.

## 2020-06-27 NOTE — Progress Notes (Signed)
[place]  Telephone:([phone]) [phone] Fax:([phone]) [phone]     ID: Essynce Munsch DOB: [birth year]  MR#: [id number]  BWI#:[id number]  Patient Care Team: [name], NP as PCP - General (Family Medicine) [name], RN as Oncology Nurse Navigator [name], RN as Oncology Nurse Navigator Turkessa Ostrom, [name], MD as Consulting Physician (Oncology) [name], MD as Attending Physician (Radiation Oncology) [name], MD as Consulting Physician (General Surgery) Larey Dresser, MD as Consulting Physician (Cardiology) [name], MD OTHER MD:  CHIEF COMPLAINT: Locally advanced estrogen receptor positive breast cancer (s/p right mastectomy)  CURRENT TREATMENT: Herceptin, tamoxifen   INTERVAL HISTORY: Jadia returns today for follow up of her locally advanced breast cancer.  She only started on tamoxifen 06/26/2020, because she wanted the diarrhea to improve before adding a new medication.  She transitioned to herceptin and perjeta on 03/15/2020. Today is day 1 cycle 6.  Diarrhea has been a problem.  She has at least 3 bowel movements most days and they are completely liquid.  This has made her feel bad and interfered with her activities and work.  Nevertheless she is working as much as she can.  Her most recent echocardiogram on 03/10/2020 showed an ejection fraction of 60-65%.  She is scheduled for repeat echo 07/07/2019   REVIEW OF SYSTEMS: Creta did very well with radiation.  The biggest problem she has been having is the diarrhea as noted above.  She has been using Imodium to try to control this.  She has a right compression sleeve which does not fit her very well and irritates her right shoulder area and armpit.  They had a good time of Thanksgiving, visiting their daughter in [place].  Aside from this a detailed review of systems today was stable   COVID 19 VACCINATION STATUS: fully vaccinated [employer]), with booster October  2021   HISTORY OF CURRENT ILLNESS: From the original intake note:  Addylynn Balin has noted a difference between her right and left breast for several years.  She last had a mammogram she thinks about 7 or 8 years ago.  More recently it seemed to her that her right breast was becoming more firm.  She brought it to medical attention and underwent bilateral diagnostic mammography with tomography and right breast ultrasonography at [facility] on 07/07/2019 showing: heterogeneously dense breast tissue; right nipple retraction; architectural distortion with microcalcifications and irregular dense tissue in the upper-outer quadrant; at least 3 abnormal right axillary lymph nodes.  Accordingly on 07/13/2019 she proceeded to biopsy of the right breast area in question. The pathology from this procedure ([id number]) showed:   1. Right Breast, upper-outer quadrant, closer to the nipple  - invasive ductal carcinoma with micropapillary features, grade 2  - ductal carcinoma in situ, grade 2-3, solid and cribriform pattern with comedonecrosis  - Prognostic indicators significant for: estrogen receptor, 100% positive with strong staining intensity and progesterone receptor, 0% negative. HER2 equivocal by immunohistochemistry (2+), but negative by fluorescent in situ hybridization with a signals ratio 1.5 and number per cell 3.8.  2. Right Breast, upper-outer quadrant, axillary tail  - invasive ductal carcinoma, grade 2  - ductal carcinoma in situ, grade 2-3  - Prognostic indicators significant for: estrogen receptor, 100% positive and progesterone receptor, 50% positive, both with strong staining intensity. HER2 equivocal by immunohistochemistry (2+), but negative by fluorescent in situ hybridization with a signals ratio 1.9 and number per cell 5.3.  The patient's subsequent history is as  detailed below.   PAST MEDICAL HISTORY: Past Medical History:  Diagnosis Date  . Anemia   . right breast ca dx'd  06/2019   chemo  . Varicose vein   . Varicose veins of left lower extremity    "bluging ones at top- no pain"    PAST SURGICAL HISTORY: Past Surgical History:  Procedure Laterality Date  . MASTECTOMY MODIFIED RADICAL Right 02/17/2020   Procedure: RIGHT MASTECTOMY MODIFIED RADICAL;  Surgeon: [name], MD;  Location: [place];  Service: General;  Laterality: Right;  . NO PAST SURGERIES    . none    . PORT-A-CATH REMOVAL Right 02/17/2020   Procedure: REMOVAL PORT-A-CATH;  Surgeon: [name], MD;  Location: [place];  Service: General;  Laterality: Right;  . PORTACATH PLACEMENT Right 08/17/2019   Procedure: INSERTION PORT-A-CATH WITH ULTRASOUND;  Surgeon: [name], MD;  Location: [place];  Service: General;  Laterality: Right;  . wisdom teeth removal     remotely.     FAMILY HISTORY: Family History  Problem Relation Age of Onset  . Diabetes Father   . Blindness Mother   . Congestive Heart Failure Sister   . Gallbladder disease Sister        removed  . Diabetes Sister   . Colitis Sister    The patient's father died at age 63 from a stroke.  The patient's mother died from multiple medical problems not related to cancer at age 69.  The patient had 4 brothers, 7 sisters.  There is no breast ovarian or prostate cancer in the family to her knowledge.  She had one cousin with pancreatic cancer.   GYNECOLOGIC HISTORY:  No LMP recorded. Patient is postmenopausal. Menarche: 64 years old Age at first live birth: 63 years old [place] P 5 LMP 59 Contraceptive: Brief use of oral contraceptives as a teenager HRT no Hysterectomy?  No BSO?    SOCIAL HISTORY: (updated 07/2019)  Naylani works in a Engineer, agricultural, and Education administrator houses.  Her husband of 40+ years [name] is a Field seismologist.  Their children are [name], 54, who lives in [place] and is a Veterinary surgeon in the [employer]; [name] 33 who works as a Designer, jewellery at [employer]; [name] who lives in [place], and  is not currently employed; Moran lives in [place], and [name] who is an Armed forces training and education officer in [place].  The patient has 4 grandchildren all under the age of 28    ADVANCED DIRECTIVES: Husband [name] is her HCPOA.   HEALTH MAINTENANCE: Social History   Tobacco Use  . Smoking status: Never Smoker  . Smokeless tobacco: Never Used  Vaping Use  . Vaping Use: Never used  Substance Use Topics  . Alcohol use: Yes    Comment: rarely  . Drug use: No     Colonoscopy: never done  PAP: Remote  Bone density: Never   No Known Allergies  Current Outpatient Medications  Medication Sig Dispense Refill  . Calcium Citrate-Vitamin D (CALCIUM CITRATE + D PO) Take 1 tablet by mouth 3 (three) times a week.     Marland Kitchen ibuprofen (ADVIL) 800 MG tablet Take 1 tablet (800 mg total) by mouth every 8 (eight) hours as needed. 30 tablet 0  . ibuprofen (ADVIL) 800 MG tablet Take 1 tablet (800 mg total) by mouth every 8 (eight) hours as needed. 30 tablet 0  . naproxen sodium (ALEVE) 220 MG tablet Take 220 mg by mouth daily as needed (pain).     Marland Kitchen  oxyCODONE (OXY IR/ROXICODONE) 5 MG immediate release tablet Take 1 tablet (5 mg total) by mouth every 6 (six) hours as needed for severe pain. 15 tablet 0  . tamoxifen (NOLVADEX) 20 MG tablet Take 1 tablet (20 mg total) by mouth daily. 90 tablet 12   No current facility-administered medications for this visit.    OBJECTIVE: White woman who appears younger than stated age 34:   06/28/20 1240  BP: (!) 109/53  Pulse: 71  Resp: 18  Temp: 97.9 F (36.6 C)  SpO2: 100%     Body mass index is 23.04 kg/m.   Wt Readings from Last 3 Encounters:  06/28/20 134 lb 3.2 oz (60.9 kg)  06/07/20 136 lb 9.6 oz (62 kg)  05/17/20 139 lb 8 oz (63.3 kg)  ECOG FS:1 - Symptomatic but completely ambulatory  Sclerae unicteric, EOMs intact Wearing a mask No cervical or supraclavicular adenopathy Lungs no rales or rhonchi Heart regular rate and rhythm Abd soft,  nontender, positive bowel sounds MSK no focal spinal tenderness, minimal right upper extremity lymphedema with sleeve in place Neuro: nonfocal, well oriented, appropriate affect Breasts: The right breast is status post mastectomy and radiation.  There is no evidence of chest wall recurrence.  The incision is flat, there is no significant erythema.  There are no findings of concern.  The left breast is benign.  Both axillae are benign.   LAB RESULTS:  CMP     Component Value Date/Time   NA 139 06/28/2020 1221   K 3.9 06/28/2020 1221   CL 109 06/28/2020 1221   CO2 20 (L) 06/28/2020 1221   GLUCOSE 91 06/28/2020 1221   BUN 15 06/28/2020 1221   CREATININE 0.70 06/28/2020 1221   CREATININE 0.79 08/04/2019 1546   CREATININE 0.76 03/27/2015 1146   CALCIUM 9.1 06/28/2020 1221   PROT 6.5 06/28/2020 1221   ALBUMIN 3.8 06/28/2020 1221   AST 23 06/28/2020 1221   AST 17 08/04/2019 1546   ALT 30 06/28/2020 1221   ALT 21 08/04/2019 1546   ALKPHOS 79 06/28/2020 1221   BILITOT 0.3 06/28/2020 1221   BILITOT 0.2 (L) 08/04/2019 1546   GFRNONAA >60 06/28/2020 1221   GFRNONAA >60 08/04/2019 1546   GFRNONAA 87 03/27/2015 1146   GFRAA >60 04/26/2020 1213   GFRAA >60 08/04/2019 1546   GFRAA >89 03/27/2015 1146    No results found for: TOTALPROTELP, ALBUMINELP, A1GS, A2GS, BETS, BETA2SER, GAMS, MSPIKE, SPEI  Lab Results  Component Value Date   WBC 3.9 (L) 06/28/2020   NEUTROABS 2.3 06/28/2020   HGB 13.1 06/28/2020   HCT 38.2 06/28/2020   MCV 84.1 06/28/2020   PLT 157 06/28/2020    No results found for: LABCA2  No components found for: [id number]  No results for input(s): INR in the last 168 hours.  No results found for: LABCA2  No results found for: CLE751  No results found for: ZGY174  No results found for: BSW967  Lab Results  Component Value Date   CA2729 27.4 08/18/2019    No components found for: HGQUANT  No results found for: CEA1 / No results found for: CEA1   No  results found for: AFPTUMOR  No results found for: CHROMOGRNA  No results found for: KPAFRELGTCHN, LAMBDASER, KAPLAMBRATIO (kappa/lambda light chains)  No results found for: HGBA, HGBA2QUANT, HGBFQUANT, HGBSQUAN (Hemoglobinopathy evaluation)   No results found for: LDH  Lab Results  Component Value Date   IRON 94 02/15/2015   TIBC 272 02/15/2015  IRONPCTSAT 35 02/15/2015   (Iron and TIBC)  Lab Results  Component Value Date   FERRITIN 70 02/15/2015    Urinalysis No results found for: COLORURINE, APPEARANCEUR, LABSPEC, PHURINE, GLUCOSEU, HGBUR, BILIRUBINUR, KETONESUR, PROTEINUR, UROBILINOGEN, NITRITE, LEUKOCYTESUR   STUDIES: No results found.   ELIGIBLE FOR AVAILABLE RESEARCH PROTOCOL:   ASSESSMENT: 64 y.o. [place], [place] woman status post right breast biopsy x2 on 07/13/2019 for a clinical T3-T4 N1-2, stage III invasive ductal carcinoma, grade 2, estrogen receptor positive, progesterone receptor variable, HER-2 not amplified.  (a) breast MRI 08/15/2019 showed a 7.6 area of enhancement, and 5 suspicious right axillary lymph nodes as well as two 0.5 mm additional masses in the inferior right breast   (b) CT chest 08/10/2019 showed no evidence of metastatic disease  (c) bone scan 08/27/2019 showed a single focus of activity at T9,  (d) thoracic spine MRI 09/13/2019 showed degenerative changes, no evidence of metastatic disease  (1) neoadjuvant chemotherapy consisting of cyclophosphamide and doxorubicin in dose dense fashion x4 starting 08/18/2019, completed 09/28/2019, followed by weekly paclitaxel x12 starting 10/12/2019, completed 01/11/2020  (2) status post right modified radical mastectomy 02/17/2020 for a residual ypT3 ypN2 invasive ductal carcinoma, with negative margins, estrogen and progesterone receptor positive, and now also HER-2 positive with a ratio of 2.98, number per cell 7.08  (3) adjuvant radiation completed 05/15/2020  (4) tamoxifen started  06/25/2020  (5) trastuzumab/pertuzumab started 03/15/2020, to continue for 1 year  (a) echocardiogram 03/10/2020 shows an ejection fraction in the 60-65% range  (b) Perjeta discontinued after the first 5 cycles secondary to diarrhea  (c) echo 07/06/2020   PLAN: Litzy is doing remarkably well with her treatments.  The big issue is diarrhea.  She is doing all she can to control it but really we just need to stop the Perjeta and we will omitted from today's treatment and from this point done.  She only just started tamoxifen 3 days ago so it is a little early to assess tolerance.  I think if I see her in about 9 weeks as she will have added long enough that we can tell if she will tolerate it well.  Assuming she does then the plan will be to continue trastuzumab and tamoxifen and restage her at some point next year given her high risk of recurrence  She knows to call for any other issue that may develop before the next visit  Total encounter time 25 minutes.    [name]. Aubrey Blackard, MD 06/28/20 1:16 PM Medical Oncology and Hematology [facility] [place], [place] [address] Tel. [phone]    Fax. [phone]   I, Wilburn Mylar, am acting as scribe for Dr. [name]. Charrise Lardner.  I, Lurline Del MD, have reviewed the above documentation for accuracy and completeness, and I agree with the above.   *Total Encounter Time as defined by the Centers for Medicare and Medicaid Services includes, in addition to the face-to-face time of a patient visit (documented in the note above) non-face-to-face time: obtaining and reviewing outside history, ordering and reviewing medications, tests or procedures, care coordination (communications with other health care professionals or caregivers) and documentation in the medical record.

## 2020-06-28 ENCOUNTER — Inpatient Hospital Stay (HOSPITAL_BASED_OUTPATIENT_CLINIC_OR_DEPARTMENT_OTHER): Payer: Managed Care, Other (non HMO) | Admitting: Oncology

## 2020-06-28 ENCOUNTER — Inpatient Hospital Stay: Payer: Managed Care, Other (non HMO)

## 2020-06-28 ENCOUNTER — Inpatient Hospital Stay: Payer: Managed Care, Other (non HMO) | Attending: Oncology

## 2020-06-28 ENCOUNTER — Other Ambulatory Visit: Payer: Self-pay

## 2020-06-28 VITALS — BP 109/53 | HR 71 | Temp 97.9°F | Resp 18 | Ht 64.0 in | Wt 134.2 lb

## 2020-06-28 DIAGNOSIS — C50411 Malignant neoplasm of upper-outer quadrant of right female breast: Secondary | ICD-10-CM

## 2020-06-28 DIAGNOSIS — Z5112 Encounter for antineoplastic immunotherapy: Secondary | ICD-10-CM | POA: Insufficient documentation

## 2020-06-28 DIAGNOSIS — Z170 Estrogen receptor positive status [ER+]: Secondary | ICD-10-CM

## 2020-06-28 DIAGNOSIS — C50911 Malignant neoplasm of unspecified site of right female breast: Secondary | ICD-10-CM

## 2020-06-28 LAB — COMPREHENSIVE METABOLIC PANEL
ALT: 30 U/L (ref 0–44)
AST: 23 U/L (ref 15–41)
Albumin: 3.8 g/dL (ref 3.5–5.0)
Alkaline Phosphatase: 79 U/L (ref 38–126)
Anion gap: 10 (ref 5–15)
BUN: 15 mg/dL (ref 8–23)
CO2: 20 mmol/L — ABNORMAL LOW (ref 22–32)
Calcium: 9.1 mg/dL (ref 8.9–10.3)
Chloride: 109 mmol/L (ref 98–111)
Creatinine, Ser: 0.7 mg/dL (ref 0.44–1.00)
GFR, Estimated: >60 mL/min (ref >60)
Glucose, Bld: 91 mg/dL (ref 70–99)
Potassium: 3.9 mmol/L (ref 3.5–5.1)
Sodium: 139 mmol/L (ref 135–145)
Total Bilirubin: 0.3 mg/dL (ref 0.3–1.2)
Total Protein: 6.5 g/dL (ref 6.5–8.1)

## 2020-06-28 LAB — CBC WITH DIFFERENTIAL/PLATELET
Abs Immature Granulocytes: 0.01 10*3/uL (ref 0.00–0.07)
Basophils Absolute: 0 10*3/uL (ref 0.0–0.1)
Basophils Relative: 1 %
Eosinophils Absolute: 0 10*3/uL (ref 0.0–0.5)
Eosinophils Relative: 1 %
HCT: 38.2 % (ref 36.0–46.0)
Hemoglobin: 13.1 g/dL (ref 12.0–15.0)
Immature Granulocytes: 0 %
Lymphocytes Relative: 33 %
Lymphs Abs: 1.3 10*3/uL (ref 0.7–4.0)
MCH: 28.9 pg (ref 26.0–34.0)
MCHC: 34.3 g/dL (ref 30.0–36.0)
MCV: 84.1 fL (ref 80.0–100.0)
Monocytes Absolute: 0.3 10*3/uL (ref 0.1–1.0)
Monocytes Relative: 7 %
Neutro Abs: 2.3 10*3/uL (ref 1.7–7.7)
Neutrophils Relative %: 58 %
Platelets: 157 10*3/uL (ref 150–400)
RBC: 4.54 MIL/uL (ref 3.87–5.11)
RDW: 13.3 % (ref 11.5–15.5)
WBC: 3.9 10*3/uL — ABNORMAL LOW (ref 4.0–10.5)
nRBC: 0 % (ref 0.0–0.2)

## 2020-06-28 MED ORDER — ACETAMINOPHEN 325 MG PO TABS
ORAL_TABLET | ORAL | Status: AC
  Filled 2020-06-28: qty 2

## 2020-06-28 MED ORDER — TRASTUZUMAB-DKST CHEMO 150 MG IV SOLR
6.0000 mg/kg | Freq: Once | INTRAVENOUS | Status: AC
  Administered 2020-06-28: 378 mg via INTRAVENOUS
  Filled 2020-06-28: qty 18

## 2020-06-28 MED ORDER — DIPHENHYDRAMINE HCL 25 MG PO CAPS
25.0000 mg | ORAL_CAPSULE | Freq: Once | ORAL | Status: AC
  Administered 2020-06-28: 25 mg via ORAL

## 2020-06-28 MED ORDER — DIPHENHYDRAMINE HCL 25 MG PO CAPS
ORAL_CAPSULE | ORAL | Status: AC
  Filled 2020-06-28: qty 1

## 2020-06-28 MED ORDER — ACETAMINOPHEN 325 MG PO TABS
650.0000 mg | ORAL_TABLET | Freq: Once | ORAL | Status: AC
  Administered 2020-06-28: 650 mg via ORAL

## 2020-06-28 MED ORDER — SODIUM CHLORIDE 0.9 % IV SOLN
Freq: Once | INTRAVENOUS | Status: AC
  Filled 2020-06-28: qty 250

## 2020-06-28 NOTE — Patient Instructions (Signed)
[facility] Discharge Instructions for Patients Receiving Chemotherapy  Today you received the following chemotherapy agents trastuzumab.  To help prevent nausea and vomiting after your treatment, we encourage you to take your nausea medication as directed.    If you develop nausea and vomiting that is not controlled by your nausea medication, call the clinic.   BELOW ARE SYMPTOMS THAT SHOULD BE REPORTED IMMEDIATELY:  *FEVER GREATER THAN 100.5 F  *CHILLS WITH OR WITHOUT FEVER  NAUSEA AND VOMITING THAT IS NOT CONTROLLED WITH YOUR NAUSEA MEDICATION  *UNUSUAL SHORTNESS OF BREATH  *UNUSUAL BRUISING OR BLEEDING  TENDERNESS IN MOUTH AND THROAT WITH OR WITHOUT PRESENCE OF ULCERS  *URINARY PROBLEMS  *BOWEL PROBLEMS  UNUSUAL RASH Items with * indicate a potential emergency and should be followed up as soon as possible.  Feel free to call the clinic should you have any questions or concerns. The clinic phone number is [phone].  Please show the CHEMO ALERT CARD at check-in to the Emergency Department and triage nurse.   

## 2020-06-28 NOTE — Progress Notes (Signed)
Pt. Discharged in stable condition.

## 2020-07-03 ENCOUNTER — Telehealth: Payer: Self-pay | Admitting: Oncology

## 2020-07-03 NOTE — Telephone Encounter (Signed)
Scheduled appts per 12/1 los. Pt to get updated appt calendar at next visit per appt notes.

## 2020-07-06 ENCOUNTER — Other Ambulatory Visit: Payer: Self-pay

## 2020-07-06 ENCOUNTER — Ambulatory Visit (HOSPITAL_COMMUNITY): Payer: Managed Care, Other (non HMO) | Attending: Cardiovascular Disease

## 2020-07-06 DIAGNOSIS — Z0189 Encounter for other specified special examinations: Secondary | ICD-10-CM

## 2020-07-06 DIAGNOSIS — C50911 Malignant neoplasm of unspecified site of right female breast: Secondary | ICD-10-CM | POA: Diagnosis not present

## 2020-07-06 DIAGNOSIS — C50411 Malignant neoplasm of upper-outer quadrant of right female breast: Secondary | ICD-10-CM | POA: Diagnosis not present

## 2020-07-06 DIAGNOSIS — Z170 Estrogen receptor positive status [ER+]: Secondary | ICD-10-CM | POA: Diagnosis not present

## 2020-07-06 LAB — ECHOCARDIOGRAM COMPLETE
Area-P 1/2: 3.36 cm2
S' Lateral: 2.7 cm

## 2020-07-10 ENCOUNTER — Other Ambulatory Visit: Payer: Self-pay

## 2020-07-10 ENCOUNTER — Ambulatory Visit: Payer: Managed Care, Other (non HMO) | Attending: Surgery | Admitting: Physical Therapy

## 2020-07-10 DIAGNOSIS — M25611 Stiffness of right shoulder, not elsewhere classified: Secondary | ICD-10-CM | POA: Insufficient documentation

## 2020-07-10 DIAGNOSIS — C50911 Malignant neoplasm of unspecified site of right female breast: Secondary | ICD-10-CM | POA: Insufficient documentation

## 2020-07-10 DIAGNOSIS — R293 Abnormal posture: Secondary | ICD-10-CM | POA: Insufficient documentation

## 2020-07-10 DIAGNOSIS — Z483 Aftercare following surgery for neoplasm: Secondary | ICD-10-CM | POA: Insufficient documentation

## 2020-07-10 DIAGNOSIS — Z170 Estrogen receptor positive status [ER+]: Secondary | ICD-10-CM | POA: Insufficient documentation

## 2020-07-10 NOTE — Therapy (Signed)
[place], [place], [address] Phone: [phone]   Fax:  [phone]  Physical Therapy Treatment  Patient Details  Name: Paige Gilbert MRN: [id number] Date of Birth: [birth year] Referring Provider (PT): Corn[place]   Encounter Date: 07/10/2020   PT End of Session - 07/10/20 1603    Visit Number 9    Number of Visits 14    PT Start Time 1526    PT Stop Time 1545    PT Time Calculation (min) 19 min    Activity Tolerance Patient tolerated treatment well           Past Medical History:  Diagnosis Date  . Anemia   . right breast ca dx'd 06/2019   chemo  . Varicose vein   . Varicose veins of left lower extremity    "bluging ones at top- no pain"    Past Surgical History:  Procedure Laterality Date  . MASTECTOMY MODIFIED RADICAL Right 02/17/2020   Procedure: RIGHT MASTECTOMY MODIFIED RADICAL;  Surgeon: [name], MD;  Location: [facility];  Service: General;  Laterality: Right;  . NO PAST SURGERIES    . none    . PORT-A-CATH REMOVAL Right 02/17/2020   Procedure: REMOVAL PORT-A-CATH;  Surgeon: [name], MD;  Location: [facility];  Service: General;  Laterality: Right;  . PORTACATH PLACEMENT Right 08/17/2019   Procedure: INSERTION PORT-A-CATH WITH ULTRASOUND;  Surgeon: [name], MD;  Location: [facility];  Service: General;  Laterality: Right;  . wisdom teeth removal     remotely.     There were no vitals filed for this visit.   Subjective Assessment - 07/10/20 1600    Subjective Pt here for 1 month L-Dex screen after scoring outside of the recommended range. It was recommended she wear a compression garment consistently for 1 month and then return to see if she's back to baseline. She reports she has not consistnetly worn her garment - maybe every other day.    Pertinent History R breast cancer, completed chemotherapy, DCIS grade 2-3, ER+ PR+  pt with right mastectomy 02/16/2200 with 7 of  9  nodes positive .  Pt had drain in about 4 weeks and developed a cellulitis.  Pt will receive herceptin infusions and radiation                  L-DEX FLOWSHEETS - 07/10/20 1600      L-DEX LYMPHEDEMA SCREENING   Measurement Type Unilateral    L-DEX MEASUREMENT EXTREMITY Upper Extremity    POSITION  Standing    DOMINANT SIDE Right    At Risk Side Right    BASELINE SCORE (UNILATERAL) -5.8    L-DEX SCORE (UNILATERAL) 11    VALUE CHANGE (UNILAT) 16.8                                  PT Long Term Goals - 04/24/20 1320      PT LONG TERM GOAL #1   Title Pt will demonstrate she has regained full shoulder ROM and function post operatively compared to baselines.    Baseline 04/24/20- pt has return to baseline ROM    Time 8    Period Weeks    Status Achieved      PT LONG TERM GOAL #2   Title Pt will demonstrate 165 degrees of left shoulder flexion to allow her to reach overhad.    Baseline  110, 04/24/20- 155    Time 5    Period Weeks    Status On-going      PT LONG TERM GOAL #3   Title Pt will demonstrate 165 degrees of right shoulder abduction to allow her to reach out to the side.    Baseline 112, 04/24/20- 178 degrees    Time 5    Period Weeks    Status Achieved      PT LONG TERM GOAL #4   Title Pt will report a 75% improvement in pain and tightness across right chest when turning head to the left to allow improved comfort.    Baseline 04/24/20- 85% improvement    Time 5    Period Weeks    Status Achieved      PT LONG TERM GOAL #5   Title Pt will be independent in a home exercise program for continued strengthening and stretching    Time 5    Period Weeks    Status On-going                 Plan - 07/10/20 1603    Clinical Impression Statement Pt continues to score outside of the recommended range. She is up 16.8 points from baseline. PT was recommended including manual lymph drainage but pt requested to wear her compression garments  consistently for 1 month and see if it improves.    PT Next Visit Plan Will reassess in 1 month; if no improvement, begin complete decongestive therapy.    Consulted and Agree with Plan of Care Patient           Patient will benefit from skilled therapeutic intervention in order to improve the following deficits and impairments:     Visit Diagnosis: Malignant neoplasm of right breast in female, estrogen receptor positive, unspecified site of breast (HCC)  Stiffness of right shoulder, not elsewhere classified  Abnormal posture  Aftercare following surgery for neoplasm     Problem List Patient Active Problem List   Diagnosis Date Noted  . Right breast cancer with T3 tumor, >5 cm in greatest dimension ([place]) 02/17/2020  . Malignant neoplasm of upper-outer quadrant of right breast in female, estrogen receptor positive ([place]) 08/03/2019  . Plantar fasciitis, bilateral 02/15/2015  . Lumbago 02/15/2015  . Screening 02/15/2015  . Hemisensory loss 06/11/2013  . Weakness 06/11/2013   Annia Friendly, PT 07/10/20 4:05 PM  [place], [place], [address] Phone: [phone]   Fax:  [phone]  Name: Paige Gilbert MRN: [id number] Date of Birth: [birth year]

## 2020-07-10 NOTE — Progress Notes (Signed)
The following biosimilar Kanjinti (trastuzumab-anns) has been selected for use in this patient.  Kennith Center, Pharm.D., CPP 07/10/2020@4 :25 PM

## 2020-07-12 NOTE — Progress Notes (Signed)
  Patient Name: Paige Gilbert MRN: [id number] DOB: [birth year] Referring Physician: WHITE MARSHA (Profile Not Attached) Date of Service: 05/15/2020 [facility]-, [place]                                                        End Of Treatment Note  Diagnoses: C50.411-Malignant neoplasm of upper-outer quadrant of right female breast  Cancer Staging: Cancer Staging Malignant neoplasm of upper-outer quadrant of right breast in female, estrogen receptor positive ([place]) Staging form: Breast, AJCC 8th Edition - Clinical: Stage IIIB (cT4, cN2, cM0, G2, ER+, PR-, HER2-) - Unsigned - Pathologic stage from 02/17/2020: No Stage Recommended (ypT3, pN2a, cM0, G2, ER+, PR+, HER2+) - Signed by Gardenia Phlegm, NP on 03/01/2020   Intent: Curative  Radiation Treatment Dates: 04/04/2020 through 05/15/2020 Site Technique Total Dose (Gy) Dose per Fx (Gy) Completed Fx Beam Energies  Chest Wall, Right: CW_Rt_IM_LN 3D 50/50 2 25/25 6X, 10X  Sclav-RT: SCV_Rt_PAB 3D 50/50 2 25/25 6X, 10X  Chest Wall, Right: CW_Rt_Bst Electron 10/10 2 5/5 6E   Narrative: The patient tolerated radiation therapy relatively well.   Plan: The patient will follow-up with radiation oncology in 13mo, or as needed.   -----------------------------------  [name], MD

## 2020-07-19 ENCOUNTER — Other Ambulatory Visit: Payer: Self-pay

## 2020-07-19 ENCOUNTER — Ambulatory Visit (HOSPITAL_COMMUNITY)
Admission: RE | Admit: 2020-07-19 | Discharge: 2020-07-19 | Disposition: A | Discharge status: Home / Self Care | Payer: Managed Care, Other (non HMO) | Source: Ambulatory Visit | Attending: Oncology | Admitting: Oncology

## 2020-07-19 ENCOUNTER — Inpatient Hospital Stay: Payer: Managed Care, Other (non HMO)

## 2020-07-19 ENCOUNTER — Other Ambulatory Visit: Payer: Self-pay | Admitting: *Deleted

## 2020-07-19 VITALS — BP 106/68 | HR 78 | Temp 97.6°F | Resp 16

## 2020-07-19 DIAGNOSIS — C50911 Malignant neoplasm of unspecified site of right female breast: Secondary | ICD-10-CM

## 2020-07-19 DIAGNOSIS — Z5112 Encounter for antineoplastic immunotherapy: Secondary | ICD-10-CM | POA: Diagnosis not present

## 2020-07-19 DIAGNOSIS — Z170 Estrogen receptor positive status [ER+]: Secondary | ICD-10-CM | POA: Insufficient documentation

## 2020-07-19 DIAGNOSIS — M25551 Pain in right hip: Secondary | ICD-10-CM | POA: Insufficient documentation

## 2020-07-19 DIAGNOSIS — C50411 Malignant neoplasm of upper-outer quadrant of right female breast: Secondary | ICD-10-CM | POA: Insufficient documentation

## 2020-07-19 LAB — CBC WITH DIFFERENTIAL/PLATELET
Abs Immature Granulocytes: 0.01 10*3/uL (ref 0.00–0.07)
Basophils Absolute: 0 10*3/uL (ref 0.0–0.1)
Basophils Relative: 1 %
Eosinophils Absolute: 0.1 10*3/uL (ref 0.0–0.5)
Eosinophils Relative: 1 %
HCT: 37.8 % (ref 36.0–46.0)
Hemoglobin: 12.8 g/dL (ref 12.0–15.0)
Immature Granulocytes: 0 %
Lymphocytes Relative: 32 %
Lymphs Abs: 1.5 10*3/uL (ref 0.7–4.0)
MCH: 28.6 pg (ref 26.0–34.0)
MCHC: 33.9 g/dL (ref 30.0–36.0)
MCV: 84.6 fL (ref 80.0–100.0)
Monocytes Absolute: 0.3 10*3/uL (ref 0.1–1.0)
Monocytes Relative: 6 %
Neutro Abs: 2.7 10*3/uL (ref 1.7–7.7)
Neutrophils Relative %: 60 %
Platelets: 155 10*3/uL (ref 150–400)
RBC: 4.47 MIL/uL (ref 3.87–5.11)
RDW: 12.8 % (ref 11.5–15.5)
WBC: 4.5 10*3/uL (ref 4.0–10.5)
nRBC: 0 % (ref 0.0–0.2)

## 2020-07-19 LAB — COMPREHENSIVE METABOLIC PANEL
ALT: 21 U/L (ref 0–44)
AST: 19 U/L (ref 15–41)
Albumin: 3.9 g/dL (ref 3.5–5.0)
Alkaline Phosphatase: 69 U/L (ref 38–126)
Anion gap: 10 (ref 5–15)
BUN: 19 mg/dL (ref 8–23)
CO2: 20 mmol/L — ABNORMAL LOW (ref 22–32)
Calcium: 8.8 mg/dL — ABNORMAL LOW (ref 8.9–10.3)
Chloride: 109 mmol/L (ref 98–111)
Creatinine, Ser: 0.81 mg/dL (ref 0.44–1.00)
GFR, Estimated: >60 mL/min (ref >60)
Glucose, Bld: 100 mg/dL — ABNORMAL HIGH (ref 70–99)
Potassium: 4 mmol/L (ref 3.5–5.1)
Sodium: 139 mmol/L (ref 135–145)
Total Bilirubin: 0.5 mg/dL (ref 0.3–1.2)
Total Protein: 6.7 g/dL (ref 6.5–8.1)

## 2020-07-19 MED ORDER — ACETAMINOPHEN 325 MG PO TABS
650.0000 mg | ORAL_TABLET | Freq: Once | ORAL | Status: AC
  Administered 2020-07-19: 13:00:00 650 mg via ORAL

## 2020-07-19 MED ORDER — DIPHENHYDRAMINE HCL 25 MG PO CAPS
25.0000 mg | ORAL_CAPSULE | Freq: Once | ORAL | Status: AC
  Administered 2020-07-19: 13:00:00 25 mg via ORAL

## 2020-07-19 MED ORDER — SODIUM CHLORIDE 0.9 % IV SOLN
Freq: Once | INTRAVENOUS | Status: AC
  Filled 2020-07-19: qty 250

## 2020-07-19 MED ORDER — HEPARIN SOD (PORK) LOCK FLUSH 100 UNIT/ML IV SOLN
500.0000 [IU] | Freq: Once | INTRAVENOUS | Status: DC | PRN
  Filled 2020-07-19: qty 5

## 2020-07-19 MED ORDER — TRASTUZUMAB-ANNS CHEMO 150 MG IV SOLR
6.0000 mg/kg | Freq: Once | INTRAVENOUS | Status: AC
  Administered 2020-07-19: 14:00:00 378 mg via INTRAVENOUS
  Filled 2020-07-19: qty 18

## 2020-07-19 MED ORDER — SODIUM CHLORIDE 0.9% FLUSH
10.0000 mL | INTRAVENOUS | Status: DC | PRN
  Filled 2020-07-19: qty 10

## 2020-07-19 NOTE — Progress Notes (Signed)
Patient presents to treatment today with c/o 1 month h/o intermittent right hip pain. Denies injury. States pain is not becoming more frequent and more intense often waking her from sleep. Also, c/o bilateral lower leg cramping.

## 2020-07-19 NOTE — Patient Instructions (Signed)
[facility] Discharge Instructions for Patients Receiving Chemotherapy  Dr. Magrinat has ordered a hip xray to be completed after your treatment today to evaluate your hip pain. Per Val (Dr. Magrinat's nurse)..."she can use Emu cream and she can use volteran gel - now over the counter- mix the two creams together and it helps leg discomfort especially at night."  Today you received the following chemotherapy agents: trastuzumab.  To help prevent nausea and vomiting after your treatment, we encourage you to take your nausea medication as directed.   If you develop nausea and vomiting that is not controlled by your nausea medication, call the clinic.   BELOW ARE SYMPTOMS THAT SHOULD BE REPORTED IMMEDIATELY:  *FEVER GREATER THAN 100.5 F  *CHILLS WITH OR WITHOUT FEVER  NAUSEA AND VOMITING THAT IS NOT CONTROLLED WITH YOUR NAUSEA MEDICATION  *UNUSUAL SHORTNESS OF BREATH  *UNUSUAL BRUISING OR BLEEDING  TENDERNESS IN MOUTH AND THROAT WITH OR WITHOUT PRESENCE OF ULCERS  *URINARY PROBLEMS  *BOWEL PROBLEMS  UNUSUAL RASH Items with * indicate a potential emergency and should be followed up as soon as possible.  Feel free to call the clinic should you have any questions or concerns. The clinic phone number is [phone].  Please show the CHEMO ALERT CARD at check-in to the Emergency Department and triage nurse.   

## 2020-08-04 ENCOUNTER — Ambulatory Visit
Admission: RE | Admit: 2020-08-04 | Discharge: 2020-08-04 | Disposition: A | Discharge status: Home / Self Care | Payer: Managed Care, Other (non HMO) | Source: Ambulatory Visit | Attending: Radiation Oncology | Admitting: Radiation Oncology

## 2020-08-04 DIAGNOSIS — C50411 Malignant neoplasm of upper-outer quadrant of right female breast: Secondary | ICD-10-CM

## 2020-08-04 DIAGNOSIS — Z170 Estrogen receptor positive status [ER+]: Secondary | ICD-10-CM

## 2020-08-04 NOTE — Progress Notes (Signed)
Radiation Oncology         [phone] ________________________________  Name: Paige Gilbert MRN: [id number]  Date: 08/04/2020  DOB: [birth year]  Follow-Up Visit Note by telephone.  The patient opted for telemedicine to maximize safety during the pandemic.  MyChart video was not obtainable.  Outpatient  CC: Paige Booze, NP  Paige Booze, NP  Diagnosis and Prior Radiotherapy:    ICD-10-CM   1. Malignant neoplasm of upper-outer quadrant of right breast in female, estrogen receptor positive ([place])  C50.411    Z17.0     CHIEF COMPLAINT: Here for follow-up and surveillance of breast cancer  Narrative:  The patient returns today for routine follow-up via telemedicine.  She is healing well from radiotherapy. She currently has Flu-like symptoms similar to daughter (who tested neg for COVID). Patient has not been tested for COVID recently. She had cont'd ROM issues with arm, follows w/ PT. She is not using any more topicals on her chest/skin but denies dryness in RT fields.                        ALLERGIES:  has No Known Allergies.  Meds: Current Outpatient Medications  Medication Sig Dispense Refill  . Calcium Citrate-Vitamin D (CALCIUM CITRATE + D PO) Take 1 tablet by mouth 3 (three) times a week.     Marland Kitchen ibuprofen (ADVIL) 800 MG tablet Take 1 tablet (800 mg total) by mouth every 8 (eight) hours as needed. 30 tablet 0  . naproxen sodium (ALEVE) 220 MG tablet Take 220 mg by mouth daily as needed (pain).      No current facility-administered medications for this encounter.    Physical Findings: The patient is in no acute distress. Patient is alert and oriented.  vitals were not taken for this visit. .      Lab Findings: Lab Results  Component Value Date   WBC 4.5 07/19/2020   HGB 12.8 07/19/2020   HCT 37.8 07/19/2020   MCV 84.6 07/19/2020   PLT 155 07/19/2020    Radiographic Findings: DG HIP UNILAT W OR W/O PELVIS 2-3 VIEWS RIGHT  Result Date: 07/20/2020 CLINICAL  DATA:  Pain.  History of breast carcinoma EXAM: DG HIP (WITH OR WITHOUT PELVIS) 2-3V RIGHT COMPARISON:  None. FINDINGS: Frontal pelvis as well as frontal and lateral right hip images were obtained. Bones are somewhat osteoporotic. No acute fracture or dislocation. Relative bony overgrowth along the lateral right ischium may represent residua of old trauma. There is mild symmetric narrowing of each hip joint. Sacroiliac joints appear normal bilaterally. No erosive change. No blastic or lytic bone lesions. IMPRESSION: Osteoporosis. Slight symmetric narrowing each hip joint. No acute fracture or dislocation. Question residua of old trauma along the lateral right ischium. No blastic or lytic bone lesions. Electronically Signed   By: Lowella Grip III M.D.   On: 07/20/2020 10:08    Impression/Plan: Per patient report, she is healing well from radiotherapy to the breast tissue.  I informed her of the high case counts of COVID in our community and told her it is possible she has COVID even if her daughter's initial test was negative. Advised her to call med/onc about her symptoms to determine if she should delay next week's appointment and /or pursue testing beforehand.  I recommend she resume skin care with topical Vitamin E Oil and / or lotion for at least 2 more months for further healing and to avoid chronic dryness.  She  will followup with medical oncology. I will see her back on an as-needed basis. I have encouraged her to call if she has any issues or concerns in the future. I wished her the very best.  This encounter was provided by telemedicine platform; patient desired telemedicine during pandemic precautions.  MYchart video was not obtainable so telephone was used. The patient has given verbal consent for this type of encounter and has been advised to only accept a meeting of this type in a secure network environment. On date of service, in total, I spent 20 minutes on this encounter.   The  attendants for this meeting include Paige Gilbert  and Paige Gilbert During the encounter, Paige Gilbert was located at [facility] Radiation Oncology Department.  Paige Gilbert was located at home.   _____________________________________   Paige Gibson, MD

## 2020-08-07 ENCOUNTER — Encounter: Payer: Self-pay | Admitting: Radiation Oncology

## 2020-08-09 ENCOUNTER — Inpatient Hospital Stay: Payer: Managed Care, Other (non HMO) | Attending: Oncology

## 2020-08-09 ENCOUNTER — Inpatient Hospital Stay: Payer: Managed Care, Other (non HMO)

## 2020-08-09 ENCOUNTER — Encounter: Payer: Self-pay | Admitting: *Deleted

## 2020-08-09 ENCOUNTER — Other Ambulatory Visit: Payer: Self-pay

## 2020-08-09 VITALS — BP 115/66 | HR 63 | Temp 97.6°F | Resp 17 | Wt 134.5 lb

## 2020-08-09 DIAGNOSIS — C50411 Malignant neoplasm of upper-outer quadrant of right female breast: Secondary | ICD-10-CM | POA: Diagnosis present

## 2020-08-09 DIAGNOSIS — Z170 Estrogen receptor positive status [ER+]: Secondary | ICD-10-CM

## 2020-08-09 DIAGNOSIS — C50911 Malignant neoplasm of unspecified site of right female breast: Secondary | ICD-10-CM

## 2020-08-09 DIAGNOSIS — Z5112 Encounter for antineoplastic immunotherapy: Secondary | ICD-10-CM | POA: Diagnosis not present

## 2020-08-09 LAB — CBC WITH DIFFERENTIAL/PLATELET
Abs Immature Granulocytes: 0.01 10*3/uL (ref 0.00–0.07)
Basophils Absolute: 0.1 10*3/uL (ref 0.0–0.1)
Basophils Relative: 1 %
Eosinophils Absolute: 0.1 10*3/uL (ref 0.0–0.5)
Eosinophils Relative: 2 %
HCT: 39.1 % (ref 36.0–46.0)
Hemoglobin: 13.1 g/dL (ref 12.0–15.0)
Immature Granulocytes: 0 %
Lymphocytes Relative: 31 %
Lymphs Abs: 1.5 10*3/uL (ref 0.7–4.0)
MCH: 28.5 pg (ref 26.0–34.0)
MCHC: 33.5 g/dL (ref 30.0–36.0)
MCV: 85.2 fL (ref 80.0–100.0)
Monocytes Absolute: 0.3 10*3/uL (ref 0.1–1.0)
Monocytes Relative: 7 %
Neutro Abs: 2.9 10*3/uL (ref 1.7–7.7)
Neutrophils Relative %: 59 %
Platelets: 166 10*3/uL (ref 150–400)
RBC: 4.59 MIL/uL (ref 3.87–5.11)
RDW: 12.6 % (ref 11.5–15.5)
WBC: 4.9 10*3/uL (ref 4.0–10.5)
nRBC: 0 % (ref 0.0–0.2)

## 2020-08-09 LAB — COMPREHENSIVE METABOLIC PANEL
ALT: 46 U/L — ABNORMAL HIGH (ref 0–44)
AST: 31 U/L (ref 15–41)
Albumin: 3.8 g/dL (ref 3.5–5.0)
Alkaline Phosphatase: 70 U/L (ref 38–126)
Anion gap: 9 (ref 5–15)
BUN: 14 mg/dL (ref 8–23)
CO2: 24 mmol/L (ref 22–32)
Calcium: 9.1 mg/dL (ref 8.9–10.3)
Chloride: 106 mmol/L (ref 98–111)
Creatinine, Ser: 0.77 mg/dL (ref 0.44–1.00)
GFR, Estimated: >60 mL/min (ref >60)
Glucose, Bld: 95 mg/dL (ref 70–99)
Potassium: 3.9 mmol/L (ref 3.5–5.1)
Sodium: 139 mmol/L (ref 135–145)
Total Bilirubin: 0.4 mg/dL (ref 0.3–1.2)
Total Protein: 7 g/dL (ref 6.5–8.1)

## 2020-08-09 MED ORDER — DIPHENHYDRAMINE HCL 25 MG PO CAPS
25.0000 mg | ORAL_CAPSULE | Freq: Once | ORAL | Status: AC
  Administered 2020-08-09: 25 mg via ORAL

## 2020-08-09 MED ORDER — SODIUM CHLORIDE 0.9 % IV SOLN
Freq: Once | INTRAVENOUS | Status: AC
  Filled 2020-08-09: qty 250

## 2020-08-09 MED ORDER — ACETAMINOPHEN 325 MG PO TABS
ORAL_TABLET | ORAL | Status: AC
  Filled 2020-08-09: qty 2

## 2020-08-09 MED ORDER — ACETAMINOPHEN 325 MG PO TABS
650.0000 mg | ORAL_TABLET | Freq: Once | ORAL | Status: AC
  Administered 2020-08-09: 650 mg via ORAL

## 2020-08-09 MED ORDER — DIPHENHYDRAMINE HCL 25 MG PO CAPS
ORAL_CAPSULE | ORAL | Status: AC
  Filled 2020-08-09: qty 1

## 2020-08-09 MED ORDER — TRASTUZUMAB-ANNS CHEMO 150 MG IV SOLR
6.0000 mg/kg | Freq: Once | INTRAVENOUS | Status: AC
  Administered 2020-08-09: 378 mg via INTRAVENOUS
  Filled 2020-08-09: qty 18

## 2020-08-09 NOTE — Patient Instructions (Signed)
[facility] Discharge Instructions for Patients Receiving Chemotherapy  Dr. Magrinat has ordered a hip xray to be completed after your treatment today to evaluate your hip pain. Per Val (Dr. Magrinat's nurse)..."she can use Emu cream and she can use volteran gel - now over the counter- mix the two creams together and it helps leg discomfort especially at night."  Today you received the following chemotherapy agents: trastuzumab.  To help prevent nausea and vomiting after your treatment, we encourage you to take your nausea medication as directed.   If you develop nausea and vomiting that is not controlled by your nausea medication, call the clinic.   BELOW ARE SYMPTOMS THAT SHOULD BE REPORTED IMMEDIATELY:  *FEVER GREATER THAN 100.5 F  *CHILLS WITH OR WITHOUT FEVER  NAUSEA AND VOMITING THAT IS NOT CONTROLLED WITH YOUR NAUSEA MEDICATION  *UNUSUAL SHORTNESS OF BREATH  *UNUSUAL BRUISING OR BLEEDING  TENDERNESS IN MOUTH AND THROAT WITH OR WITHOUT PRESENCE OF ULCERS  *URINARY PROBLEMS  *BOWEL PROBLEMS  UNUSUAL RASH Items with * indicate a potential emergency and should be followed up as soon as possible.  Feel free to call the clinic should you have any questions or concerns. The clinic phone number is [phone].  Please show the CHEMO ALERT CARD at check-in to the Emergency Department and triage nurse.   

## 2020-08-14 ENCOUNTER — Ambulatory Visit: Payer: Managed Care, Other (non HMO)

## 2020-08-29 NOTE — Progress Notes (Signed)
Paige Gilbert  [phone] [phone]     ID: Paige Gilbert DOB: 04-24-1956  MR#: [id number]  FAO#:[id number]  Patient Care Team: [name], NP as PCP - General (Family Medicine) [name], RN as Oncology Nurse Navigator [name], RN as Oncology Nurse Navigator Ananya Mccleese, [name], MD as Consulting Physician (Oncology) [name], MD as Attending Physician (Radiation Oncology) [name], MD as Consulting Physician (General Surgery) Larey Dresser, MD as Consulting Physician (Cardiology) [name], MD OTHER MD:  CHIEF COMPLAINT: Locally advanced estrogen receptor positive breast cancer (s/p right mastectomy)  CURRENT TREATMENT: Herceptin, tamoxifen   INTERVAL HISTORY: Paige Gilbert returns today for follow up of her locally advanced breast cancer.  She continues on tamoxifen, started 06/26/2020.  She tolerates this well, and hot flashes and vaginal dryness are not a major issue.  She transitioned to herceptin and perjeta on 03/15/2020. Perjeta was discontinued following her 5th cycle secondary to diarrhea.  She is tolerating the trastuzumab with no side effects that she is aware of.  Since her last visit, she underwent repeat echocardiogram on 07/06/2020 showing an ejection fraction of 55-60%. This is only slightly lower than prior in 02/2020.   REVIEW OF SYSTEMS: Sham complains of itchy skin.  Partly this may be due because chemotherapy does cause skin dryness and partly because of it being winter.  She has had a bit of a cough and something like a flulike illness but she has been repeatedly tested for Covid and has been negative.  Her husband however was positive about 2 weeks ago.  He had similar symptoms.  She has a strange sensation in the right armpit and right chest wall area which she interprets as lymphedema.  She has some discomfort in the right shoulder area with certain movements.  A detailed review of  systems was otherwise stable   COVID 19 VACCINATION STATUS: fully vaccinated [employer]), with booster October 2021   HISTORY OF CURRENT ILLNESS: From the original intake note:  Paige Gilbert has noted a difference between her right and left breast for several years.  She last had a mammogram she thinks about 7 or 8 years ago.  More recently it seemed to her that her right breast was becoming more firm.  She brought it to medical attention and underwent bilateral diagnostic mammography with tomography and right breast ultrasonography at [facility] on 07/07/2019 showing: heterogeneously dense breast tissue; right nipple retraction; architectural distortion with microcalcifications and irregular dense tissue in the upper-outer quadrant; at least 3 abnormal right axillary lymph nodes.  Accordingly on 07/13/2019 she proceeded to biopsy of the right breast area in question. The pathology from this procedure ([id number]) showed:   1. Right Breast, upper-outer quadrant, closer to the nipple  - invasive ductal carcinoma with micropapillary features, grade 2  - ductal carcinoma in situ, grade 2-3, solid and cribriform pattern with comedonecrosis  - Prognostic indicators significant for: estrogen receptor, 100% positive with strong staining intensity and progesterone receptor, 0% negative. HER2 equivocal by immunohistochemistry (2+), but negative by fluorescent in situ hybridization with a signals ratio 1.5 and number per cell 3.8.  2. Right Breast, upper-outer quadrant, axillary tail  - invasive ductal carcinoma, grade 2  - ductal carcinoma in situ, grade 2-3  - Prognostic indicators significant for: estrogen receptor, 100% positive and progesterone receptor, 50% positive, both with strong staining intensity. HER2 equivocal by immunohistochemistry (2+), but negative by fluorescent in situ hybridization with a signals ratio 1.9  and number per cell 5.3.  The patient's subsequent history is as detailed  below.   PAST MEDICAL HISTORY: Past Medical History:  Diagnosis Date  . Anemia   . right breast ca dx'd 06/2019   chemo  . Varicose vein   . Varicose veins of left lower extremity    "bluging ones at top- no pain"    PAST SURGICAL HISTORY: Past Surgical History:  Procedure Laterality Date  . MASTECTOMY MODIFIED RADICAL Right 02/17/2020   Procedure: RIGHT MASTECTOMY MODIFIED RADICAL;  Surgeon: [name], MD;  Location: [facility];  Service: General;  Laterality: Right;  . NO PAST SURGERIES    . none    . PORT-A-CATH REMOVAL Right 02/17/2020   Procedure: REMOVAL PORT-A-CATH;  Surgeon: [name], MD;  Location: [facility];  Service: General;  Laterality: Right;  . PORTACATH PLACEMENT Right 08/17/2019   Procedure: INSERTION PORT-A-CATH WITH ULTRASOUND;  Surgeon: [name], MD;  Location: [facility];  Service: General;  Laterality: Right;  . wisdom teeth removal     remotely.     FAMILY HISTORY: Family History  Problem Relation Age of Onset  . Diabetes Father   . Blindness Mother   . Congestive Heart Failure Sister   . Gallbladder disease Sister        removed  . Diabetes Sister   . Colitis Sister    The patient's father died at age 86 from a stroke.  The patient's mother died from multiple medical problems not related to cancer at age 31.  The patient had 4 brothers, 7 sisters.  There is no breast ovarian or prostate cancer in the family to her knowledge.  She had one cousin with pancreatic cancer.   GYNECOLOGIC HISTORY:  No LMP recorded. Patient is postmenopausal. Menarche: 65 years old Age at first live birth: 65 years old [place] P 5 LMP 57 Contraceptive: Brief use of oral contraceptives as a teenager HRT no Hysterectomy?  No BSO?    SOCIAL HISTORY: (updated 07/2019)  Koralee works in a Engineer, agricultural, and Education administrator houses.  Her husband of 40+ years [name] is a Field seismologist.  Their children are [name], 19, who lives in [place] and is a Veterinary surgeon in the  [employer]; [name] 33 who works as a Designer, jewellery at [employer]; [name] who lives in [place], and is not currently employed; [place] lives in [place], and [name] who is an Armed forces training and education officer in [place].  The patient has 4 grandchildren all under the age of 51    ADVANCED DIRECTIVES: Husband [name] is her HCPOA.   HEALTH MAINTENANCE: Social History   Tobacco Use  . Smoking status: Never Smoker  . Smokeless tobacco: Never Used  Vaping Use  . Vaping Use: Never used  Substance Use Topics  . Alcohol use: Yes    Comment: rarely  . Drug use: No     Colonoscopy: never done  PAP: Remote  Bone density: Never   No Known Allergies  Current Outpatient Medications  Medication Sig Dispense Refill  . Calcium Citrate-Vitamin D (CALCIUM CITRATE + D PO) Take 1 tablet by mouth 3 (three) times a week.     Marland Kitchen ibuprofen (ADVIL) 800 MG tablet Take 1 tablet (800 mg total) by mouth every 8 (eight) hours as needed. 30 tablet 0  . naproxen sodium (ALEVE) 220 MG tablet Take 220 mg by mouth daily as needed (pain).      No current facility-administered medications for this visit.    OBJECTIVE: White  woman who appears younger than stated age 56:   08/30/20 1259  BP: 118/63  Pulse: 71  Resp: 16  Temp: 98.1 F (36.7 C)  SpO2: 100%     Body mass index is 23.55 kg/m.   Wt Readings from Last 3 Encounters:  08/30/20 137 lb 3.2 oz (62.2 kg)  08/09/20 134 lb 8 oz (61 kg)  06/28/20 134 lb 3.2 oz (60.9 kg)  ECOG FS:1 - Symptomatic but completely ambulatory  Sclerae unicteric, EOMs intact Wearing a mask No cervical or supraclavicular adenopathy Lungs no rales or rhonchi Heart regular rate and rhythm Abd soft, nontender, positive bowel sounds MSK no focal spinal tenderness, no upper extremity lymphedema Neuro: nonfocal, well oriented, appropriate affect Breasts: The right breast is status post mastectomy and radiation.  There is no evidence of local recurrence.  There is  no obvious lymphedema.  The left breast is benign.  Both axillae are benign.   LAB RESULTS:  CMP     Component Value Date/Time   NA 139 08/09/2020 1143   K 3.9 08/09/2020 1143   CL 106 08/09/2020 1143   CO2 24 08/09/2020 1143   GLUCOSE 95 08/09/2020 1143   BUN 14 08/09/2020 1143   CREATININE 0.77 08/09/2020 1143   CREATININE 0.79 08/04/2019 1546   CREATININE 0.76 03/27/2015 1146   CALCIUM 9.1 08/09/2020 1143   PROT 7.0 08/09/2020 1143   ALBUMIN 3.8 08/09/2020 1143   AST 31 08/09/2020 1143   AST 17 08/04/2019 1546   ALT 46 (H) 08/09/2020 1143   ALT 21 08/04/2019 1546   ALKPHOS 70 08/09/2020 1143   BILITOT 0.4 08/09/2020 1143   BILITOT 0.2 (L) 08/04/2019 1546   GFRNONAA >60 08/09/2020 1143   GFRNONAA >60 08/04/2019 1546   GFRNONAA 87 03/27/2015 1146   GFRAA >60 04/26/2020 1213   GFRAA >60 08/04/2019 1546   GFRAA >89 03/27/2015 1146    No results found for: TOTALPROTELP, ALBUMINELP, A1GS, A2GS, BETS, BETA2SER, GAMS, MSPIKE, SPEI  Lab Results  Component Value Date   WBC 5.9 08/30/2020   NEUTROABS 4.0 08/30/2020   HGB 11.7 (L) 08/30/2020   HCT 34.7 (L) 08/30/2020   MCV 85.9 08/30/2020   PLT 138 (L) 08/30/2020    No results found for: LABCA2  No components found for: XMIWOE321  No results for input(s): INR in the last 168 hours.  No results found for: LABCA2  No results found for: YYQ825  No results found for: OIB704  No results found for: UGQ916  Lab Results  Component Value Date   CA2729 27.4 08/18/2019    No components found for: HGQUANT  No results found for: CEA1 / No results found for: CEA1   No results found for: AFPTUMOR  No results found for: CHROMOGRNA  No results found for: KPAFRELGTCHN, LAMBDASER, KAPLAMBRATIO (kappa/lambda light chains)  No results found for: HGBA, HGBA2QUANT, HGBFQUANT, HGBSQUAN (Hemoglobinopathy evaluation)   No results found for: LDH  Lab Results  Component Value Date   IRON 94 02/15/2015   TIBC 272  02/15/2015   IRONPCTSAT 35 02/15/2015   (Iron and TIBC)  Lab Results  Component Value Date   FERRITIN 70 02/15/2015    Urinalysis No results found for: COLORURINE, APPEARANCEUR, LABSPEC, PHURINE, GLUCOSEU, HGBUR, BILIRUBINUR, KETONESUR, PROTEINUR, UROBILINOGEN, NITRITE, LEUKOCYTESUR   STUDIES: No results found.   ELIGIBLE FOR AVAILABLE RESEARCH PROTOCOL:   ASSESSMENT: 65 y.o. [place], [place] woman status post right breast biopsy x2 on 07/13/2019 for a clinical T3-T4 N1-2, stage  III invasive ductal carcinoma, grade 2, estrogen receptor positive, progesterone receptor variable, HER-2 not amplified.  (a) breast MRI 08/15/2019 showed a 7.6 area of enhancement, and 5 suspicious right axillary lymph nodes as well as two 0.5 mm additional masses in the inferior right breast   (b) CT chest 08/10/2019 showed no evidence of metastatic disease  (c) bone scan 08/27/2019 showed a single focus of activity at T9,  (d) thoracic spine MRI 09/13/2019 showed degenerative changes, no evidence of metastatic disease  (1) neoadjuvant chemotherapy consisting of cyclophosphamide and doxorubicin in dose dense fashion x4 starting 08/18/2019, completed 09/28/2019, followed by weekly paclitaxel x12 starting 10/12/2019, completed 01/11/2020  (2) status post right modified radical mastectomy 02/17/2020 for a residual ypT3 ypN2 invasive ductal carcinoma, with negative margins, estrogen and progesterone receptor positive, and now also HER-2 positive with a ratio of 2.98, number per cell 7.08  (3) adjuvant radiation completed 05/15/2020  (4) tamoxifen started 06/25/2020  (5) trastuzumab/pertuzumab started 03/15/2020, to continue for 1 year  (a) echocardiogram 03/10/2020 shows an ejection fraction in the 60-65% range  (b) pertuzumab discontinued after the first 5 cycles secondary to diarrhea  (c) echo 07/06/2020 shows an ejection fraction in the 55-60% range   PLAN: Mally is now half a year out from  definitive surgery for her breast cancer with no evidence of disease recurrence.  This is favorable.  She is tolerating tamoxifen well and the plan is to continue that a minimum of 5 years.  I think the dry skin is going to be due to her earlier chemotherapy as well as it being winter.  We discussed ways to keep the skin moist which I think will help.  A little bit of time also will help on by the summer I suspect that problem will be resolved.  I do not detect any evidence of lymphedema.  I think she is having mild neuropathic discomfort from the prior surgery in the right chest wall area.  This is common and benign.  It may or may not resolve.  She will be due for repeat echo early March and I have put that order in place.  She will be due for mammography later that month and I have also entered that order.  She will see me again in April.  She knows to call for any other issue admittable before the next visit  Total encounter time 35 minutes.Sarajane Jews C. Jone Panebianco, MD 08/30/20 1:05 PM Medical Oncology and Hematology [facility] [place], [place] [address] Tel. [phone]    Fax. [phone]   I, Wilburn Mylar, am acting as scribe for Dr. [name]. Ionia Schey.  I, Lurline Del MD, have reviewed the above documentation for accuracy and completeness, and I agree with the above.   *Total Encounter Time as defined by the Centers for Medicare and Medicaid Services includes, in addition to the face-to-face time of a patient visit (documented in the note above) non-face-to-face time: obtaining and reviewing outside history, ordering and reviewing medications, tests or procedures, care coordination (communications with other health care professionals or caregivers) and documentation in the medical record.

## 2020-08-30 ENCOUNTER — Inpatient Hospital Stay: Payer: Managed Care, Other (non HMO)

## 2020-08-30 ENCOUNTER — Inpatient Hospital Stay: Payer: Managed Care, Other (non HMO) | Attending: Oncology

## 2020-08-30 ENCOUNTER — Inpatient Hospital Stay: Payer: Managed Care, Other (non HMO) | Admitting: Oncology

## 2020-08-30 ENCOUNTER — Other Ambulatory Visit: Payer: Self-pay

## 2020-08-30 VITALS — BP 118/63 | HR 71 | Temp 98.1°F | Resp 16 | Ht 64.0 in | Wt 137.2 lb

## 2020-08-30 DIAGNOSIS — C50911 Malignant neoplasm of unspecified site of right female breast: Secondary | ICD-10-CM

## 2020-08-30 DIAGNOSIS — Z170 Estrogen receptor positive status [ER+]: Secondary | ICD-10-CM | POA: Insufficient documentation

## 2020-08-30 DIAGNOSIS — C50411 Malignant neoplasm of upper-outer quadrant of right female breast: Secondary | ICD-10-CM

## 2020-08-30 DIAGNOSIS — Z5112 Encounter for antineoplastic immunotherapy: Secondary | ICD-10-CM | POA: Diagnosis not present

## 2020-08-30 DIAGNOSIS — M818 Other osteoporosis without current pathological fracture: Secondary | ICD-10-CM | POA: Diagnosis not present

## 2020-08-30 DIAGNOSIS — Z79810 Long term (current) use of selective estrogen receptor modulators (SERMs): Secondary | ICD-10-CM | POA: Diagnosis not present

## 2020-08-30 DIAGNOSIS — M810 Age-related osteoporosis without current pathological fracture: Secondary | ICD-10-CM | POA: Insufficient documentation

## 2020-08-30 LAB — COMPREHENSIVE METABOLIC PANEL
ALT: 29 U/L (ref 0–44)
AST: 25 U/L (ref 15–41)
Albumin: 3.5 g/dL (ref 3.5–5.0)
Alkaline Phosphatase: 58 U/L (ref 38–126)
Anion gap: 7 (ref 5–15)
BUN: 15 mg/dL (ref 8–23)
CO2: 23 mmol/L (ref 22–32)
Calcium: 8.4 mg/dL — ABNORMAL LOW (ref 8.9–10.3)
Chloride: 108 mmol/L (ref 98–111)
Creatinine, Ser: 0.72 mg/dL (ref 0.44–1.00)
GFR, Estimated: >60 mL/min (ref >60)
Glucose, Bld: 95 mg/dL (ref 70–99)
Potassium: 4.1 mmol/L (ref 3.5–5.1)
Sodium: 138 mmol/L (ref 135–145)
Total Bilirubin: 0.3 mg/dL (ref 0.3–1.2)
Total Protein: 6.3 g/dL — ABNORMAL LOW (ref 6.5–8.1)

## 2020-08-30 LAB — CBC WITH DIFFERENTIAL/PLATELET
Abs Immature Granulocytes: 0.01 10*3/uL (ref 0.00–0.07)
Basophils Absolute: 0 10*3/uL (ref 0.0–0.1)
Basophils Relative: 0 %
Eosinophils Absolute: 0.1 10*3/uL (ref 0.0–0.5)
Eosinophils Relative: 2 %
HCT: 34.7 % — ABNORMAL LOW (ref 36.0–46.0)
Hemoglobin: 11.7 g/dL — ABNORMAL LOW (ref 12.0–15.0)
Immature Granulocytes: 0 %
Lymphocytes Relative: 25 %
Lymphs Abs: 1.5 10*3/uL (ref 0.7–4.0)
MCH: 29 pg (ref 26.0–34.0)
MCHC: 33.7 g/dL (ref 30.0–36.0)
MCV: 85.9 fL (ref 80.0–100.0)
Monocytes Absolute: 0.2 10*3/uL (ref 0.1–1.0)
Monocytes Relative: 4 %
Neutro Abs: 4 10*3/uL (ref 1.7–7.7)
Neutrophils Relative %: 69 %
Platelets: 138 10*3/uL — ABNORMAL LOW (ref 150–400)
RBC: 4.04 MIL/uL (ref 3.87–5.11)
RDW: 13 % (ref 11.5–15.5)
WBC: 5.9 10*3/uL (ref 4.0–10.5)
nRBC: 0 % (ref 0.0–0.2)

## 2020-08-30 MED ORDER — ACETAMINOPHEN 325 MG PO TABS
ORAL_TABLET | ORAL | Status: AC
  Filled 2020-08-30: qty 2

## 2020-08-30 MED ORDER — SODIUM CHLORIDE 0.9 % IV SOLN
Freq: Once | INTRAVENOUS | Status: AC
  Filled 2020-08-30: qty 250

## 2020-08-30 MED ORDER — DIPHENHYDRAMINE HCL 25 MG PO CAPS
ORAL_CAPSULE | ORAL | Status: AC
  Filled 2020-08-30: qty 1

## 2020-08-30 MED ORDER — DIPHENHYDRAMINE HCL 25 MG PO CAPS
25.0000 mg | ORAL_CAPSULE | Freq: Once | ORAL | Status: AC
  Administered 2020-08-30: 25 mg via ORAL

## 2020-08-30 MED ORDER — TRASTUZUMAB-ANNS CHEMO 150 MG IV SOLR
6.0000 mg/kg | Freq: Once | INTRAVENOUS | Status: AC
  Administered 2020-08-30: 378 mg via INTRAVENOUS
  Filled 2020-08-30: qty 18

## 2020-08-30 MED ORDER — ACETAMINOPHEN 325 MG PO TABS
650.0000 mg | ORAL_TABLET | Freq: Once | ORAL | Status: AC
  Administered 2020-08-30: 650 mg via ORAL

## 2020-08-30 NOTE — Patient Instructions (Signed)
[facility] Discharge Instructions for Patients Receiving Chemotherapy  Today you received the following chemotherapy agents trastuzumab.  To help prevent nausea and vomiting after your treatment, we encourage you to take your nausea medication as directed.    If you develop nausea and vomiting that is not controlled by your nausea medication, call the clinic.   BELOW ARE SYMPTOMS THAT SHOULD BE REPORTED IMMEDIATELY:  *FEVER GREATER THAN 100.5 F  *CHILLS WITH OR WITHOUT FEVER  NAUSEA AND VOMITING THAT IS NOT CONTROLLED WITH YOUR NAUSEA MEDICATION  *UNUSUAL SHORTNESS OF BREATH  *UNUSUAL BRUISING OR BLEEDING  TENDERNESS IN MOUTH AND THROAT WITH OR WITHOUT PRESENCE OF ULCERS  *URINARY PROBLEMS  *BOWEL PROBLEMS  UNUSUAL RASH Items with * indicate a potential emergency and should be followed up as soon as possible.  Feel free to call the clinic should you have any questions or concerns. The clinic phone number is [phone].  Please show the CHEMO ALERT CARD at check-in to the Emergency Department and triage nurse.   

## 2020-09-01 ENCOUNTER — Telehealth: Payer: Self-pay | Admitting: Oncology

## 2020-09-01 NOTE — Telephone Encounter (Signed)
Scheduled appts per 2/2 los. Pt to get updated appt calendar at next visit.  

## 2020-09-19 ENCOUNTER — Encounter: Payer: Self-pay | Admitting: *Deleted

## 2020-09-20 ENCOUNTER — Other Ambulatory Visit: Payer: Self-pay

## 2020-09-20 ENCOUNTER — Inpatient Hospital Stay: Payer: Managed Care, Other (non HMO)

## 2020-09-20 ENCOUNTER — Other Ambulatory Visit: Payer: Managed Care, Other (non HMO)

## 2020-09-20 VITALS — BP 115/54 | HR 73 | Temp 97.8°F | Resp 16

## 2020-09-20 DIAGNOSIS — Z170 Estrogen receptor positive status [ER+]: Secondary | ICD-10-CM

## 2020-09-20 DIAGNOSIS — Z5112 Encounter for antineoplastic immunotherapy: Secondary | ICD-10-CM | POA: Diagnosis not present

## 2020-09-20 DIAGNOSIS — C50911 Malignant neoplasm of unspecified site of right female breast: Secondary | ICD-10-CM

## 2020-09-20 LAB — COMPREHENSIVE METABOLIC PANEL
ALT: 20 U/L (ref 0–44)
AST: 19 U/L (ref 15–41)
Albumin: 3.7 g/dL (ref 3.5–5.0)
Alkaline Phosphatase: 63 U/L (ref 38–126)
Anion gap: 7 (ref 5–15)
BUN: 14 mg/dL (ref 8–23)
CO2: 23 mmol/L (ref 22–32)
Calcium: 8.5 mg/dL — ABNORMAL LOW (ref 8.9–10.3)
Chloride: 108 mmol/L (ref 98–111)
Creatinine, Ser: 0.78 mg/dL (ref 0.44–1.00)
GFR, Estimated: >60 mL/min (ref >60)
Glucose, Bld: 115 mg/dL — ABNORMAL HIGH (ref 70–99)
Potassium: 3.9 mmol/L (ref 3.5–5.1)
Sodium: 138 mmol/L (ref 135–145)
Total Bilirubin: 0.5 mg/dL (ref 0.3–1.2)
Total Protein: 6.5 g/dL (ref 6.5–8.1)

## 2020-09-20 LAB — CBC WITH DIFFERENTIAL/PLATELET
Abs Immature Granulocytes: 0.01 10*3/uL (ref 0.00–0.07)
Basophils Absolute: 0 10*3/uL (ref 0.0–0.1)
Basophils Relative: 1 %
Eosinophils Absolute: 0.2 10*3/uL (ref 0.0–0.5)
Eosinophils Relative: 3 %
HCT: 36 % (ref 36.0–46.0)
Hemoglobin: 12.2 g/dL (ref 12.0–15.0)
Immature Granulocytes: 0 %
Lymphocytes Relative: 38 %
Lymphs Abs: 1.7 10*3/uL (ref 0.7–4.0)
MCH: 28.8 pg (ref 26.0–34.0)
MCHC: 33.9 g/dL (ref 30.0–36.0)
MCV: 85.1 fL (ref 80.0–100.0)
Monocytes Absolute: 0.3 10*3/uL (ref 0.1–1.0)
Monocytes Relative: 7 %
Neutro Abs: 2.2 10*3/uL (ref 1.7–7.7)
Neutrophils Relative %: 51 %
Platelets: 147 10*3/uL — ABNORMAL LOW (ref 150–400)
RBC: 4.23 MIL/uL (ref 3.87–5.11)
RDW: 13.5 % (ref 11.5–15.5)
WBC: 4.4 10*3/uL (ref 4.0–10.5)
nRBC: 0 % (ref 0.0–0.2)

## 2020-09-20 MED ORDER — DIPHENHYDRAMINE HCL 25 MG PO CAPS
25.0000 mg | ORAL_CAPSULE | Freq: Once | ORAL | Status: AC
  Administered 2020-09-20: 25 mg via ORAL

## 2020-09-20 MED ORDER — SODIUM CHLORIDE 0.9 % IV SOLN
Freq: Once | INTRAVENOUS | Status: AC
  Filled 2020-09-20: qty 250

## 2020-09-20 MED ORDER — TRASTUZUMAB-ANNS CHEMO 150 MG IV SOLR
6.0000 mg/kg | Freq: Once | INTRAVENOUS | Status: AC
  Administered 2020-09-20: 378 mg via INTRAVENOUS
  Filled 2020-09-20: qty 18

## 2020-09-20 MED ORDER — DIPHENHYDRAMINE HCL 25 MG PO CAPS
ORAL_CAPSULE | ORAL | Status: AC
  Filled 2020-09-20: qty 2

## 2020-09-20 MED ORDER — ACETAMINOPHEN 325 MG PO TABS
ORAL_TABLET | ORAL | Status: AC
  Filled 2020-09-20: qty 2

## 2020-09-20 MED ORDER — ACETAMINOPHEN 325 MG PO TABS
650.0000 mg | ORAL_TABLET | Freq: Once | ORAL | Status: AC
  Administered 2020-09-20: 650 mg via ORAL

## 2020-10-04 ENCOUNTER — Ambulatory Visit (HOSPITAL_COMMUNITY): Payer: Managed Care, Other (non HMO) | Attending: Cardiology

## 2020-10-04 ENCOUNTER — Other Ambulatory Visit: Payer: Self-pay

## 2020-10-04 DIAGNOSIS — M818 Other osteoporosis without current pathological fracture: Secondary | ICD-10-CM | POA: Diagnosis not present

## 2020-10-04 DIAGNOSIS — C50911 Malignant neoplasm of unspecified site of right female breast: Secondary | ICD-10-CM | POA: Diagnosis not present

## 2020-10-04 DIAGNOSIS — Z170 Estrogen receptor positive status [ER+]: Secondary | ICD-10-CM | POA: Diagnosis present

## 2020-10-04 DIAGNOSIS — C50411 Malignant neoplasm of upper-outer quadrant of right female breast: Secondary | ICD-10-CM | POA: Diagnosis present

## 2020-10-04 LAB — ECHOCARDIOGRAM COMPLETE
Area-P 1/2: 4.6 cm2
S' Lateral: 3.2 cm

## 2020-10-11 ENCOUNTER — Inpatient Hospital Stay: Payer: Managed Care, Other (non HMO)

## 2020-10-11 ENCOUNTER — Inpatient Hospital Stay: Payer: Managed Care, Other (non HMO) | Attending: Oncology

## 2020-10-11 ENCOUNTER — Other Ambulatory Visit: Payer: Self-pay

## 2020-10-11 VITALS — BP 114/63 | HR 73 | Temp 97.8°F | Resp 18 | Wt 138.2 lb

## 2020-10-11 DIAGNOSIS — Z5112 Encounter for antineoplastic immunotherapy: Secondary | ICD-10-CM | POA: Insufficient documentation

## 2020-10-11 DIAGNOSIS — C50411 Malignant neoplasm of upper-outer quadrant of right female breast: Secondary | ICD-10-CM | POA: Diagnosis present

## 2020-10-11 DIAGNOSIS — C50911 Malignant neoplasm of unspecified site of right female breast: Secondary | ICD-10-CM

## 2020-10-11 LAB — CBC WITH DIFFERENTIAL/PLATELET
Abs Immature Granulocytes: 0.01 10*3/uL (ref 0.00–0.07)
Basophils Absolute: 0.1 10*3/uL (ref 0.0–0.1)
Basophils Relative: 1 %
Eosinophils Absolute: 0.2 10*3/uL (ref 0.0–0.5)
Eosinophils Relative: 3 %
HCT: 38.5 % (ref 36.0–46.0)
Hemoglobin: 12.9 g/dL (ref 12.0–15.0)
Immature Granulocytes: 0 %
Lymphocytes Relative: 25 %
Lymphs Abs: 1.4 10*3/uL (ref 0.7–4.0)
MCH: 28.9 pg (ref 26.0–34.0)
MCHC: 33.5 g/dL (ref 30.0–36.0)
MCV: 86.1 fL (ref 80.0–100.0)
Monocytes Absolute: 0.3 10*3/uL (ref 0.1–1.0)
Monocytes Relative: 5 %
Neutro Abs: 3.8 10*3/uL (ref 1.7–7.7)
Neutrophils Relative %: 66 %
Platelets: 159 10*3/uL (ref 150–400)
RBC: 4.47 MIL/uL (ref 3.87–5.11)
RDW: 12.9 % (ref 11.5–15.5)
WBC: 5.7 10*3/uL (ref 4.0–10.5)
nRBC: 0 % (ref 0.0–0.2)

## 2020-10-11 LAB — COMPREHENSIVE METABOLIC PANEL
ALT: 29 U/L (ref 0–44)
AST: 22 U/L (ref 15–41)
Albumin: 3.6 g/dL (ref 3.5–5.0)
Alkaline Phosphatase: 61 U/L (ref 38–126)
Anion gap: 7 (ref 5–15)
BUN: 14 mg/dL (ref 8–23)
CO2: 23 mmol/L (ref 22–32)
Calcium: 8.6 mg/dL — ABNORMAL LOW (ref 8.9–10.3)
Chloride: 107 mmol/L (ref 98–111)
Creatinine, Ser: 0.82 mg/dL (ref 0.44–1.00)
GFR, Estimated: >60 mL/min (ref >60)
Glucose, Bld: 93 mg/dL (ref 70–99)
Potassium: 3.9 mmol/L (ref 3.5–5.1)
Sodium: 137 mmol/L (ref 135–145)
Total Bilirubin: 0.4 mg/dL (ref 0.3–1.2)
Total Protein: 6.5 g/dL (ref 6.5–8.1)

## 2020-10-11 MED ORDER — ACETAMINOPHEN 325 MG PO TABS
650.0000 mg | ORAL_TABLET | Freq: Once | ORAL | Status: AC
  Administered 2020-10-11: 650 mg via ORAL

## 2020-10-11 MED ORDER — DIPHENHYDRAMINE HCL 25 MG PO CAPS
25.0000 mg | ORAL_CAPSULE | Freq: Once | ORAL | Status: AC
  Administered 2020-10-11: 25 mg via ORAL

## 2020-10-11 MED ORDER — TRASTUZUMAB-ANNS CHEMO 150 MG IV SOLR
6.0000 mg/kg | Freq: Once | INTRAVENOUS | Status: AC
  Administered 2020-10-11: 378 mg via INTRAVENOUS
  Filled 2020-10-11: qty 18

## 2020-10-11 MED ORDER — SODIUM CHLORIDE 0.9 % IV SOLN
Freq: Once | INTRAVENOUS | Status: AC
  Filled 2020-10-11: qty 250

## 2020-10-11 MED ORDER — DIPHENHYDRAMINE HCL 25 MG PO CAPS
ORAL_CAPSULE | ORAL | Status: AC
  Filled 2020-10-11: qty 1

## 2020-10-11 MED ORDER — ACETAMINOPHEN 325 MG PO TABS
ORAL_TABLET | ORAL | Status: AC
  Filled 2020-10-11: qty 2

## 2020-10-11 NOTE — Patient Instructions (Signed)
Paige Gilbert Discharge Instructions for Patients Receiving Chemotherapy  Dr. Jana Hakim has ordered a hip xray to be completed after your treatment today to evaluate your hip pain. Per Paige Gilbert (Dr. [name] nurse)..."she can use Emu cream and she can use volteran gel - now over the counter- mix the two creams together and it helps leg discomfort especially at night."  Today you received the following chemotherapy agents: trastuzumab.  To help prevent nausea and vomiting after your treatment, we encourage you to take your nausea medication as directed.   If you develop nausea and vomiting that is not controlled by your nausea medication, call the clinic.   BELOW ARE SYMPTOMS THAT SHOULD BE REPORTED IMMEDIATELY:  *FEVER GREATER THAN 100.5 F  *CHILLS WITH OR WITHOUT FEVER  NAUSEA AND VOMITING THAT IS NOT CONTROLLED WITH YOUR NAUSEA MEDICATION  *UNUSUAL SHORTNESS OF BREATH  *UNUSUAL BRUISING OR BLEEDING  TENDERNESS IN MOUTH AND THROAT WITH OR WITHOUT PRESENCE OF ULCERS  *URINARY PROBLEMS  *BOWEL PROBLEMS  UNUSUAL RASH Items with * indicate a potential emergency and should be followed up as soon as possible.  Feel free to call the clinic should you have any questions or concerns. The clinic phone number is [phone].  Please show the [place] at check-in to the Emergency Department and triage nurse.

## 2020-11-01 ENCOUNTER — Inpatient Hospital Stay: Payer: Managed Care, Other (non HMO)

## 2020-11-01 ENCOUNTER — Other Ambulatory Visit: Payer: Self-pay

## 2020-11-01 ENCOUNTER — Inpatient Hospital Stay: Payer: Managed Care, Other (non HMO) | Attending: Oncology

## 2020-11-01 VITALS — BP 132/71 | HR 64 | Temp 98.6°F | Resp 18 | Ht 64.0 in | Wt 137.2 lb

## 2020-11-01 DIAGNOSIS — Z79810 Long term (current) use of selective estrogen receptor modulators (SERMs): Secondary | ICD-10-CM | POA: Diagnosis not present

## 2020-11-01 DIAGNOSIS — Z170 Estrogen receptor positive status [ER+]: Secondary | ICD-10-CM | POA: Diagnosis not present

## 2020-11-01 DIAGNOSIS — C50911 Malignant neoplasm of unspecified site of right female breast: Secondary | ICD-10-CM

## 2020-11-01 DIAGNOSIS — Z5112 Encounter for antineoplastic immunotherapy: Secondary | ICD-10-CM | POA: Insufficient documentation

## 2020-11-01 DIAGNOSIS — C50411 Malignant neoplasm of upper-outer quadrant of right female breast: Secondary | ICD-10-CM | POA: Diagnosis present

## 2020-11-01 LAB — CBC WITH DIFFERENTIAL/PLATELET
Abs Immature Granulocytes: 0.01 10*3/uL (ref 0.00–0.07)
Basophils Absolute: 0.1 10*3/uL (ref 0.0–0.1)
Basophils Relative: 1 %
Eosinophils Absolute: 0.1 10*3/uL (ref 0.0–0.5)
Eosinophils Relative: 3 %
HCT: 38.3 % (ref 36.0–46.0)
Hemoglobin: 12.9 g/dL (ref 12.0–15.0)
Immature Granulocytes: 0 %
Lymphocytes Relative: 31 %
Lymphs Abs: 1.5 10*3/uL (ref 0.7–4.0)
MCH: 29.1 pg (ref 26.0–34.0)
MCHC: 33.7 g/dL (ref 30.0–36.0)
MCV: 86.5 fL (ref 80.0–100.0)
Monocytes Absolute: 0.3 10*3/uL (ref 0.1–1.0)
Monocytes Relative: 5 %
Neutro Abs: 2.9 10*3/uL (ref 1.7–7.7)
Neutrophils Relative %: 60 %
Platelets: 146 10*3/uL — ABNORMAL LOW (ref 150–400)
RBC: 4.43 MIL/uL (ref 3.87–5.11)
RDW: 12.6 % (ref 11.5–15.5)
WBC: 4.8 10*3/uL (ref 4.0–10.5)
nRBC: 0 % (ref 0.0–0.2)

## 2020-11-01 LAB — COMPREHENSIVE METABOLIC PANEL
ALT: 23 U/L (ref 0–44)
AST: 21 U/L (ref 15–41)
Albumin: 3.7 g/dL (ref 3.5–5.0)
Alkaline Phosphatase: 58 U/L (ref 38–126)
Anion gap: 11 (ref 5–15)
BUN: 13 mg/dL (ref 8–23)
CO2: 22 mmol/L (ref 22–32)
Calcium: 8.6 mg/dL — ABNORMAL LOW (ref 8.9–10.3)
Chloride: 105 mmol/L (ref 98–111)
Creatinine, Ser: 0.79 mg/dL (ref 0.44–1.00)
GFR, Estimated: >60 mL/min (ref >60)
Glucose, Bld: 113 mg/dL — ABNORMAL HIGH (ref 70–99)
Potassium: 4.2 mmol/L (ref 3.5–5.1)
Sodium: 138 mmol/L (ref 135–145)
Total Bilirubin: 0.3 mg/dL (ref 0.3–1.2)
Total Protein: 6.7 g/dL (ref 6.5–8.1)

## 2020-11-01 MED ORDER — DIPHENHYDRAMINE HCL 25 MG PO CAPS
ORAL_CAPSULE | ORAL | Status: AC
  Filled 2020-11-01: qty 2

## 2020-11-01 MED ORDER — DIPHENHYDRAMINE HCL 25 MG PO CAPS
25.0000 mg | ORAL_CAPSULE | Freq: Once | ORAL | Status: AC
  Administered 2020-11-01: 25 mg via ORAL

## 2020-11-01 MED ORDER — ACETAMINOPHEN 325 MG PO TABS
650.0000 mg | ORAL_TABLET | Freq: Once | ORAL | Status: AC
  Administered 2020-11-01: 650 mg via ORAL

## 2020-11-01 MED ORDER — ACETAMINOPHEN 325 MG PO TABS
ORAL_TABLET | ORAL | Status: AC
  Filled 2020-11-01: qty 2

## 2020-11-01 MED ORDER — SODIUM CHLORIDE 0.9 % IV SOLN
6.0000 mg/kg | Freq: Once | INTRAVENOUS | Status: AC
Start: 2020-11-01 — End: 2020-11-01
  Administered 2020-11-01: 378 mg via INTRAVENOUS
  Filled 2020-11-01: qty 18

## 2020-11-01 MED ORDER — SODIUM CHLORIDE 0.9 % IV SOLN
Freq: Once | INTRAVENOUS | Status: AC
  Filled 2020-11-01: qty 250

## 2020-11-21 ENCOUNTER — Encounter: Payer: Self-pay | Admitting: *Deleted

## 2020-11-21 NOTE — Progress Notes (Signed)
[place]  [phone] [phone]     ID: Paige Gilbert DOB: 1956-03-15  MR#: [id number]  LID#:[id number]  Patient Care Team: [name], NP as PCP - General (Family Medicine) [name], RN as Oncology Nurse Navigator [name], RN as Oncology Nurse Navigator Warner Laduca, [name], MD as Consulting Physician (Oncology) [name], MD as Attending Physician (Radiation Oncology) [name], MD as Consulting Physician (General Surgery) Larey Dresser, MD as Consulting Physician (Cardiology) [name], MD OTHER MD:  CHIEF COMPLAINT: Locally advanced estrogen receptor positive breast cancer (s/p right mastectomy)  CURRENT TREATMENT: Herceptin, tamoxifen   INTERVAL HISTORY: Paige Gilbert returns today for follow up and treatment of her locally advanced breast cancer.  She continues on tamoxifen, started 06/26/2020.  She tolerates this well, and hot flashes and vaginal dryness are not a major issue.  She transitioned to herceptin and perjeta on 03/15/2020. Perjeta was discontinued following her 5th cycle secondary to diarrhea.  She is tolerating the trastuzumab with no side effects that she is aware of.  Since her last visit, she underwent repeat echocardiogram on 10/04/2020 showing an ejection fraction of 60-65%, which is back to her baseline.   REVIEW OF SYSTEMS: Paige Gilbert is still wearing her wig.  She tells me her hair has come and white and incredibly [place].  She does not like it much.  She has some swelling of the right arm.  She was wearing a compression sleeve for a while but it bothers her particularly in the right axilla.  She is back to cleaning houses.  She is visiting family in [place] and [place] and watching the grandchildren.  Generally she feels she is doing well and reentering normalcy   COVID 19 VACCINATION STATUS: fully vaccinated [employer]), with booster October 2021   HISTORY OF CURRENT  ILLNESS: From the original intake note:  Paige Gilbert has noted a difference between her right and left breast for several years.  She last had a mammogram she thinks about 7 or 8 years ago.  More recently it seemed to her that her right breast was becoming more firm.  She brought it to medical attention and underwent bilateral diagnostic mammography with tomography and right breast ultrasonography at [facility] on 07/07/2019 showing: heterogeneously dense breast tissue; right nipple retraction; architectural distortion with microcalcifications and irregular dense tissue in the upper-outer quadrant; at least 3 abnormal right axillary lymph nodes.  Accordingly on 07/13/2019 she proceeded to biopsy of the right breast area in question. The pathology from this procedure ([id number]) showed:   1. Right Breast, upper-outer quadrant, closer to the nipple  - invasive ductal carcinoma with micropapillary features, grade 2  - ductal carcinoma in situ, grade 2-3, solid and cribriform pattern with comedonecrosis  - Prognostic indicators significant for: estrogen receptor, 100% positive with strong staining intensity and progesterone receptor, 0% negative. HER2 equivocal by immunohistochemistry (2+), but negative by fluorescent in situ hybridization with a signals ratio 1.5 and number per cell 3.8.  2. Right Breast, upper-outer quadrant, axillary tail  - invasive ductal carcinoma, grade 2  - ductal carcinoma in situ, grade 2-3  - Prognostic indicators significant for: estrogen receptor, 100% positive and progesterone receptor, 50% positive, both with strong staining intensity. HER2 equivocal by immunohistochemistry (2+), but negative by fluorescent in situ hybridization with a signals ratio 1.9 and number per cell 5.3.  The patient's subsequent history is as detailed below.   PAST MEDICAL HISTORY: Past Medical History:  Diagnosis Date  . Anemia   . right breast ca dx'd 06/2019   chemo  .  Varicose vein   . Varicose veins of left lower extremity    "bluging ones at top- no pain"    PAST SURGICAL HISTORY: Past Surgical History:  Procedure Laterality Date  . MASTECTOMY MODIFIED RADICAL Right 02/17/2020   Procedure: RIGHT MASTECTOMY MODIFIED RADICAL;  Surgeon: [name], MD;  Location: [facility];  Service: General;  Laterality: Right;  . NO PAST SURGERIES    . none    . PORT-A-CATH REMOVAL Right 02/17/2020   Procedure: REMOVAL PORT-A-CATH;  Surgeon: [name], MD;  Location: [facility];  Service: General;  Laterality: Right;  . PORTACATH PLACEMENT Right 08/17/2019   Procedure: INSERTION PORT-A-CATH WITH ULTRASOUND;  Surgeon: [name], MD;  Location: [facility];  Service: General;  Laterality: Right;  . wisdom teeth removal     remotely.     FAMILY HISTORY: Family History  Problem Relation Age of Onset  . Diabetes Father   . Blindness Mother   . Congestive Heart Failure Sister   . Gallbladder disease Sister        removed  . Diabetes Sister   . Colitis Sister   The patient's father died at age 39 from a stroke.  The patient's mother died from multiple medical problems not related to cancer at age 47.  The patient had 4 brothers, 7 sisters.  There is no breast ovarian or prostate cancer in the family to her knowledge.  She had one cousin with pancreatic cancer.   GYNECOLOGIC HISTORY:  No LMP recorded. Patient is postmenopausal. Menarche: 65 years old Age at first live birth: 65 years old [place] P 5 LMP 67 Contraceptive: Brief use of oral contraceptives as a teenager HRT no Hysterectomy?  No BSO?    SOCIAL HISTORY: (updated 07/2019)  Paige Gilbert works in a Engineer, agricultural, and Education administrator houses.  Her husband of 40+ years Paige Gilbert is a Field seismologist.  Their children are Paige Gilbert, 33, who lives in [place] and is a Veterinary surgeon in the [employer]; Paige Gilbert 33 who works as a Designer, jewellery at [employer]; Paige Gilbert who lives in [place], and is not currently  employed; Paige Gilbert lives in [place], and Paige Gilbert who is an Armed forces training and education officer in [place].  The patient has 4 grandchildren all under the age of 20    ADVANCED DIRECTIVES: Husband Paige Gilbert is her HCPOA.   HEALTH MAINTENANCE: Social History   Tobacco Use  . Smoking status: Never Smoker  . Smokeless tobacco: Never Used  Vaping Use  . Vaping Use: Never used  Substance Use Topics  . Alcohol use: Yes    Comment: rarely  . Drug use: No     Colonoscopy: never done  PAP: Remote  Bone density: Never   No Known Allergies  Current Outpatient Medications  Medication Sig Dispense Refill  . Calcium Citrate-Vitamin D (CALCIUM CITRATE + D PO) Take 1 tablet by mouth 3 (three) times a week.     Marland Kitchen ibuprofen (ADVIL) 800 MG tablet Take 1 tablet (800 mg total) by mouth every 8 (eight) hours as needed. 30 tablet 0  . naproxen sodium (ALEVE) 220 MG tablet Take 220 mg by mouth daily as needed (pain).     . tamoxifen (NOLVADEX) 20 MG tablet      No current facility-administered medications for this visit.    OBJECTIVE: White woman who appears younger than stated age 25:   11/22/20 1336  BP:  119/62  Pulse: 71  Resp: 16  Temp: 97.9 F (36.6 C)  SpO2: 100%     Body mass index is 24.03 kg/m.   Wt Readings from Last 3 Encounters:  11/22/20 140 lb (63.5 kg)  11/01/20 137 lb 4 oz (62.3 kg)  10/11/20 138 lb 4 oz (62.7 kg)  ECOG FS:1 - Symptomatic but completely ambulatory  Sclerae unicteric, EOMs intact Wearing a mask No cervical or supraclavicular adenopathy Lungs no rales or rhonchi Heart regular rate and rhythm Abd soft, nontender, positive bowel sounds MSK no focal spinal tenderness, no upper extremity lymphedema Neuro: nonfocal, well oriented, appropriate affect Breasts: The right breast is status post mastectomy and radiation there is no evidence of local recurrence per the left breast is benign.  Both axillae are benign   LAB RESULTS:  CMP     Component Value  Date/Time   NA 138 11/01/2020 1200   K 4.2 11/01/2020 1200   CL 105 11/01/2020 1200   CO2 22 11/01/2020 1200   GLUCOSE 113 (H) 11/01/2020 1200   BUN 13 11/01/2020 1200   CREATININE 0.79 11/01/2020 1200   CREATININE 0.79 08/04/2019 1546   CREATININE 0.76 03/27/2015 1146   CALCIUM 8.6 (L) 11/01/2020 1200   PROT 6.7 11/01/2020 1200   ALBUMIN 3.7 11/01/2020 1200   AST 21 11/01/2020 1200   AST 17 08/04/2019 1546   ALT 23 11/01/2020 1200   ALT 21 08/04/2019 1546   ALKPHOS 58 11/01/2020 1200   BILITOT 0.3 11/01/2020 1200   BILITOT 0.2 (L) 08/04/2019 1546   GFRNONAA >60 11/01/2020 1200   GFRNONAA >60 08/04/2019 1546   GFRNONAA 87 03/27/2015 1146   GFRAA >60 04/26/2020 1213   GFRAA >60 08/04/2019 1546   GFRAA >89 03/27/2015 1146    No results found for: TOTALPROTELP, ALBUMINELP, A1GS, A2GS, BETS, BETA2SER, GAMS, MSPIKE, SPEI  Lab Results  Component Value Date   WBC 4.7 11/22/2020   NEUTROABS 2.6 11/22/2020   HGB 12.7 11/22/2020   HCT 37.7 11/22/2020   MCV 85.9 11/22/2020   PLT 141 (L) 11/22/2020    No results found for: LABCA2  No components found for: RTMYTR173  No results for input(s): INR in the last 168 hours.  No results found for: LABCA2  No results found for: VAP014  No results found for: DCV013  No results found for: HYH888  Lab Results  Component Value Date   CA2729 27.4 08/18/2019    No components found for: HGQUANT  No results found for: CEA1 / No results found for: CEA1   No results found for: AFPTUMOR  No results found for: CHROMOGRNA  No results found for: KPAFRELGTCHN, LAMBDASER, KAPLAMBRATIO (kappa/lambda light chains)  No results found for: HGBA, HGBA2QUANT, HGBFQUANT, HGBSQUAN (Hemoglobinopathy evaluation)   No results found for: LDH  Lab Results  Component Value Date   IRON 94 02/15/2015   TIBC 272 02/15/2015   IRONPCTSAT 35 02/15/2015   (Iron and TIBC)  Lab Results  Component Value Date   FERRITIN 70 02/15/2015     Urinalysis No results found for: COLORURINE, APPEARANCEUR, LABSPEC, PHURINE, GLUCOSEU, HGBUR, BILIRUBINUR, KETONESUR, PROTEINUR, UROBILINOGEN, NITRITE, LEUKOCYTESUR   STUDIES: No results found.   ELIGIBLE FOR AVAILABLE RESEARCH PROTOCOL:   ASSESSMENT: 65 y.o. [place], [place] woman status post right breast biopsy x2 on 07/13/2019 for a clinical T3-T4 N1-2, stage III invasive ductal carcinoma, grade 2, estrogen receptor positive, progesterone receptor variable, HER-2 not amplified.  (a) breast MRI 08/15/2019 showed a 7.6 area  of enhancement, and 5 suspicious right axillary lymph nodes as well as two 0.5 mm additional masses in the inferior right breast   (b) CT chest 08/10/2019 showed no evidence of metastatic disease  (c) bone scan 08/27/2019 showed a single focus of activity at T9,  (d) thoracic spine MRI 09/13/2019 showed degenerative changes, no evidence of metastatic disease  (1) neoadjuvant chemotherapy consisting of cyclophosphamide and doxorubicin in dose dense fashion x4 starting 08/18/2019, completed 09/28/2019, followed by weekly paclitaxel x12 starting 10/12/2019, completed 01/11/2020  (2) status post right modified radical mastectomy 02/17/2020 for a residual ypT3 ypN2 invasive ductal carcinoma, with negative margins, estrogen and progesterone receptor positive, and now also HER-2 positive with a ratio of 2.98, number per cell 7.08  (3) adjuvant radiation completed 05/15/2020  (4) tamoxifen started 06/25/2020  (5) trastuzumab/pertuzumab started 03/15/2020, to continue for 1 year  (a) echocardiogram 03/10/2020 shows an ejection fraction in the 60-65% range  (b) pertuzumab discontinued after the first 5 cycles secondary to diarrhea  (c) echo 07/06/2020 shows an ejection fraction in the 55-60% range  (d) echo 10/04/2020 shows an ejection fraction in the 60-65% range.   PLAN: Emalene is now getting close to a year out from definitive surgery for her breast cancer with no  evidence of disease recurrence.  This is favorable.  She is tolerating tamoxifen generally well.  The plan is to continue that a minimum of 5 years.  She has no issues with Herceptin and is up-to-date on her echocardiography.  She will have her next echo in June.  I commended her on her exercise program and getting back to work.  I suggested she wear her sleeve as often as she can and if she would like a different prescription of course I will be glad to write her 1.  She will see me again in August with her last Herceptin dose.  She knows to call for any other issue that may develop before then  Total encounter time 25 minutes.Paige Gilbert C. Siyah Mault, MD 11/22/20 1:42 PM Medical Oncology and Hematology [facility] [place], [place] [address] Tel. [phone]    Fax. [phone]   I, Wilburn Mylar, am acting as scribe for Dr. [name]. Robie Oats.  I, Lurline Del MD, have reviewed the above documentation for accuracy and completeness, and I agree with the above.   *Total Encounter Time as defined by the Centers for Medicare and Medicaid Services includes, in addition to the face-to-face time of a patient visit (documented in the note above) non-face-to-face time: obtaining and reviewing outside history, ordering and reviewing medications, tests or procedures, care coordination (communications with other health care professionals or caregivers) and documentation in the medical record.

## 2020-11-22 ENCOUNTER — Inpatient Hospital Stay: Payer: Managed Care, Other (non HMO)

## 2020-11-22 ENCOUNTER — Other Ambulatory Visit: Payer: Self-pay

## 2020-11-22 ENCOUNTER — Inpatient Hospital Stay (HOSPITAL_BASED_OUTPATIENT_CLINIC_OR_DEPARTMENT_OTHER): Payer: Managed Care, Other (non HMO) | Admitting: Oncology

## 2020-11-22 VITALS — BP 119/62 | HR 71 | Temp 97.9°F | Resp 16 | Ht 64.0 in | Wt 140.0 lb

## 2020-11-22 DIAGNOSIS — C50911 Malignant neoplasm of unspecified site of right female breast: Secondary | ICD-10-CM | POA: Diagnosis not present

## 2020-11-22 DIAGNOSIS — C50411 Malignant neoplasm of upper-outer quadrant of right female breast: Secondary | ICD-10-CM | POA: Diagnosis not present

## 2020-11-22 DIAGNOSIS — Z170 Estrogen receptor positive status [ER+]: Secondary | ICD-10-CM

## 2020-11-22 DIAGNOSIS — Z5112 Encounter for antineoplastic immunotherapy: Secondary | ICD-10-CM | POA: Diagnosis not present

## 2020-11-22 LAB — CBC WITH DIFFERENTIAL/PLATELET
Abs Immature Granulocytes: 0.01 10*3/uL (ref 0.00–0.07)
Basophils Absolute: 0.1 10*3/uL (ref 0.0–0.1)
Basophils Relative: 1 %
Eosinophils Absolute: 0.1 10*3/uL (ref 0.0–0.5)
Eosinophils Relative: 3 %
HCT: 37.7 % (ref 36.0–46.0)
Hemoglobin: 12.7 g/dL (ref 12.0–15.0)
Immature Granulocytes: 0 %
Lymphocytes Relative: 37 %
Lymphs Abs: 1.8 10*3/uL (ref 0.7–4.0)
MCH: 28.9 pg (ref 26.0–34.0)
MCHC: 33.7 g/dL (ref 30.0–36.0)
MCV: 85.9 fL (ref 80.0–100.0)
Monocytes Absolute: 0.2 10*3/uL (ref 0.1–1.0)
Monocytes Relative: 4 %
Neutro Abs: 2.6 10*3/uL (ref 1.7–7.7)
Neutrophils Relative %: 55 %
Platelets: 141 10*3/uL — ABNORMAL LOW (ref 150–400)
RBC: 4.39 MIL/uL (ref 3.87–5.11)
RDW: 12.7 % (ref 11.5–15.5)
WBC: 4.7 10*3/uL (ref 4.0–10.5)
nRBC: 0 % (ref 0.0–0.2)

## 2020-11-22 LAB — COMPREHENSIVE METABOLIC PANEL
ALT: 18 U/L (ref 0–44)
AST: 17 U/L (ref 15–41)
Albumin: 3.5 g/dL (ref 3.5–5.0)
Alkaline Phosphatase: 60 U/L (ref 38–126)
Anion gap: 8 (ref 5–15)
BUN: 16 mg/dL (ref 8–23)
CO2: 23 mmol/L (ref 22–32)
Calcium: 8.5 mg/dL — ABNORMAL LOW (ref 8.9–10.3)
Chloride: 108 mmol/L (ref 98–111)
Creatinine, Ser: 0.71 mg/dL (ref 0.44–1.00)
GFR, Estimated: >60 mL/min (ref >60)
Glucose, Bld: 101 mg/dL — ABNORMAL HIGH (ref 70–99)
Potassium: 4.3 mmol/L (ref 3.5–5.1)
Sodium: 139 mmol/L (ref 135–145)
Total Bilirubin: <0.2 mg/dL — ABNORMAL LOW (ref 0.3–1.2)
Total Protein: 6.5 g/dL (ref 6.5–8.1)

## 2020-11-22 MED ORDER — DIPHENHYDRAMINE HCL 25 MG PO CAPS
25.0000 mg | ORAL_CAPSULE | Freq: Once | ORAL | Status: AC
Start: 2020-11-22 — End: 2020-11-22
  Administered 2020-11-22: 25 mg via ORAL

## 2020-11-22 MED ORDER — ACETAMINOPHEN 325 MG PO TABS
650.0000 mg | ORAL_TABLET | Freq: Once | ORAL | Status: AC
  Administered 2020-11-22: 650 mg via ORAL

## 2020-11-22 MED ORDER — SODIUM CHLORIDE 0.9 % IV SOLN
Freq: Once | INTRAVENOUS | Status: AC
Start: 2020-11-22 — End: 2020-11-22
  Filled 2020-11-22: qty 250

## 2020-11-22 MED ORDER — DIPHENHYDRAMINE HCL 25 MG PO CAPS
ORAL_CAPSULE | ORAL | Status: AC
  Filled 2020-11-22: qty 1

## 2020-11-22 MED ORDER — ACETAMINOPHEN 325 MG PO TABS
ORAL_TABLET | ORAL | Status: AC
  Filled 2020-11-22: qty 2

## 2020-11-22 MED ORDER — VITAMIN D 25 MCG (1000 UNIT) PO TABS: 1000.0000 [IU] | ORAL_TABLET | Freq: Every day | ORAL | 4 refills | Status: DC

## 2020-11-22 MED ORDER — TRASTUZUMAB-ANNS CHEMO 150 MG IV SOLR
6.0000 mg/kg | Freq: Once | INTRAVENOUS | Status: AC
  Administered 2020-11-22: 378 mg via INTRAVENOUS
  Filled 2020-11-22: qty 18

## 2020-11-22 NOTE — Patient Instructions (Signed)
[facility]ICAL [facility]COLOG[facility]  Discharge Instructions: Thank you for choosing [facility] to provide your oncology and hematology care.   If you have a lab appointment with the Cancer Center, please go directly to the Cancer Center and check in at the registration area.   Wear comfortable clothing and clothing appropriate for easy access to any Portacath or PICC line.   We strive to give you quality time with your provider. You may need to reschedule your appointment if you arrive late (15 or more minutes).  Arriving late affects you and other patients whose appointments are after yours.  Also, if you miss three or more appointments without notifying the office, you may be dismissed from the clinic at the provider's discretion.      For prescription refill requests, have your pharmacy contact our office and allow 72 hours for refills to be completed.    Today you received the following chemotherapy and/or immunotherapy agents   To help prevent nausea and vomiting after your treatment, we encourage you to take your nausea medication as directed.  BELOW ARE SYMPTOMS THAT SHOULD BE REPORTED IMMEDIATELY: *FEVER GREATER THAN 100.4 F (38 C) OR HIGHER *CHILLS OR SWEATING *NAUSEA AND VOMITING THAT IS NOT CONTROLLED WITH YOUR NAUSEA MEDICATION *UNUSUAL SHORTNESS OF BREATH *UNUSUAL BRUISING OR BLEEDING *URINARY PROBLEMS (pain or burning when urinating, or frequent urination) *BOWEL PROBLEMS (unusual diarrhea, constipation, pain near the anus) TENDERNESS IN MOUTH AND THROAT WITH OR WITHOUT PRESENCE OF ULCERS (sore throat, sores in mouth, or a toothache) UNUSUAL RASH, SWELLING OR PAIN  UNUSUAL VAGINAL DISCHARGE OR ITCHING   Items with * indicate a potential emergency and should be followed up as soon as possible or go to the Emergency Department if any problems should occur.  Please show the CHEMOTHERAPY ALERT CARD or IMMUNOTHERAPY ALERT CARD at check-in to the Emergency  Department and triage nurse.  Should you have questions after your visit or need to cancel or reschedule your appointment, please contact [facility]ICAL [facility]COL[facility]  Dept: [phone]  and follow the prompts.  Office hours are 8:00 a.m. to 4:30 p.m. Monday - Friday. Please note that voicemails left after 4:00 p.m. may not be returned until the following business day.  We are closed weekends and major holidays. You have access to a nurse at all times for urgent questions. Please call the main number to the clinic Dept: [phone] and follow the prompts.   For any non-urgent questions, you may also contact your provider using MyChart. We now offer e-Visits for anyone 18 and older to request care online for non-urgent symptoms. For details visit [email or web address].   Also download the MyChart app! Go to the app store, search "MyChart", open the app, select [facility], and log in with your MyChart username and password.  Due to Covid, a mask is required upon entering the hospital/clinic. If you do not have a mask, one will be given to you upon arrival. For doctor visits, patients may have 1 support person aged 18 or older with them. For treatment visits, patients cannot have anyone with them due to current Covid guidelines and our immunocompromised population.   

## 2020-11-24 ENCOUNTER — Telehealth: Payer: Self-pay | Admitting: Oncology

## 2020-11-24 NOTE — Telephone Encounter (Signed)
Scheduled per 4/27 los. Pt will receive an updated appt calendar

## 2020-12-13 ENCOUNTER — Other Ambulatory Visit: Payer: Self-pay

## 2020-12-13 ENCOUNTER — Inpatient Hospital Stay: Payer: Managed Care, Other (non HMO)

## 2020-12-13 ENCOUNTER — Inpatient Hospital Stay: Payer: Managed Care, Other (non HMO) | Attending: Oncology

## 2020-12-13 ENCOUNTER — Encounter: Payer: Self-pay | Admitting: *Deleted

## 2020-12-13 VITALS — BP 106/63 | HR 69 | Temp 97.9°F | Resp 16 | Wt 139.8 lb

## 2020-12-13 DIAGNOSIS — Z170 Estrogen receptor positive status [ER+]: Secondary | ICD-10-CM

## 2020-12-13 DIAGNOSIS — C50911 Malignant neoplasm of unspecified site of right female breast: Secondary | ICD-10-CM

## 2020-12-13 DIAGNOSIS — Z5112 Encounter for antineoplastic immunotherapy: Secondary | ICD-10-CM | POA: Insufficient documentation

## 2020-12-13 DIAGNOSIS — C50411 Malignant neoplasm of upper-outer quadrant of right female breast: Secondary | ICD-10-CM

## 2020-12-13 LAB — CBC WITH DIFFERENTIAL/PLATELET
Abs Immature Granulocytes: 0.01 10*3/uL (ref 0.00–0.07)
Basophils Absolute: 0.1 10*3/uL (ref 0.0–0.1)
Basophils Relative: 1 %
Eosinophils Absolute: 0.1 10*3/uL (ref 0.0–0.5)
Eosinophils Relative: 2 %
HCT: 37.9 % (ref 36.0–46.0)
Hemoglobin: 12.6 g/dL (ref 12.0–15.0)
Immature Granulocytes: 0 %
Lymphocytes Relative: 32 %
Lymphs Abs: 1.5 10*3/uL (ref 0.7–4.0)
MCH: 28.4 pg (ref 26.0–34.0)
MCHC: 33.2 g/dL (ref 30.0–36.0)
MCV: 85.4 fL (ref 80.0–100.0)
Monocytes Absolute: 0.3 10*3/uL (ref 0.1–1.0)
Monocytes Relative: 6 %
Neutro Abs: 2.9 10*3/uL (ref 1.7–7.7)
Neutrophils Relative %: 59 %
Platelets: 130 10*3/uL — ABNORMAL LOW (ref 150–400)
RBC: 4.44 MIL/uL (ref 3.87–5.11)
RDW: 12.6 % (ref 11.5–15.5)
WBC: 4.8 10*3/uL (ref 4.0–10.5)
nRBC: 0 % (ref 0.0–0.2)

## 2020-12-13 LAB — COMPREHENSIVE METABOLIC PANEL
ALT: 20 U/L (ref 0–44)
AST: 20 U/L (ref 15–41)
Albumin: 3.6 g/dL (ref 3.5–5.0)
Alkaline Phosphatase: 53 U/L (ref 38–126)
Anion gap: 9 (ref 5–15)
BUN: 19 mg/dL (ref 8–23)
CO2: 25 mmol/L (ref 22–32)
Calcium: 9 mg/dL (ref 8.9–10.3)
Chloride: 106 mmol/L (ref 98–111)
Creatinine, Ser: 0.72 mg/dL (ref 0.44–1.00)
GFR, Estimated: >60 mL/min (ref >60)
Glucose, Bld: 91 mg/dL (ref 70–99)
Potassium: 4.1 mmol/L (ref 3.5–5.1)
Sodium: 140 mmol/L (ref 135–145)
Total Bilirubin: 0.3 mg/dL (ref 0.3–1.2)
Total Protein: 6.6 g/dL (ref 6.5–8.1)

## 2020-12-13 MED ORDER — DIPHENHYDRAMINE HCL 25 MG PO CAPS
25.0000 mg | ORAL_CAPSULE | Freq: Once | ORAL | Status: AC
  Administered 2020-12-13: 25 mg via ORAL

## 2020-12-13 MED ORDER — ACETAMINOPHEN 325 MG PO TABS
ORAL_TABLET | ORAL | Status: AC
  Filled 2020-12-13: qty 2

## 2020-12-13 MED ORDER — ACETAMINOPHEN 325 MG PO TABS
650.0000 mg | ORAL_TABLET | Freq: Once | ORAL | Status: AC
  Administered 2020-12-13: 650 mg via ORAL

## 2020-12-13 MED ORDER — SODIUM CHLORIDE 0.9 % IV SOLN
Freq: Once | INTRAVENOUS | Status: AC
  Filled 2020-12-13: qty 250

## 2020-12-13 MED ORDER — TRASTUZUMAB-ANNS CHEMO 150 MG IV SOLR
6.0000 mg/kg | Freq: Once | INTRAVENOUS | Status: AC
  Administered 2020-12-13: 378 mg via INTRAVENOUS
  Filled 2020-12-13: qty 18

## 2020-12-13 MED ORDER — DIPHENHYDRAMINE HCL 25 MG PO CAPS
ORAL_CAPSULE | ORAL | Status: AC
  Filled 2020-12-13: qty 1

## 2020-12-13 NOTE — Patient Instructions (Signed)
[facility]  Discharge Instructions: Thank you for choosing [facility] to provide your oncology and hematology care.   If you have a lab appointment with the Cancer Center, please go directly to the Cancer Center and check in at the registration area.   Wear comfortable clothing and clothing appropriate for easy access to any Portacath or PICC line.   We strive to give you quality time with your provider. You may need to reschedule your appointment if you arrive late (15 or more minutes).  Arriving late affects you and other patients whose appointments are after yours.  Also, if you miss three or more appointments without notifying the office, you may be dismissed from the clinic at the provider's discretion.      For prescription refill requests, have your pharmacy contact our office and allow 72 hours for refills to be completed.    Today you received the following chemotherapy and/or immunotherapy agents   To help prevent nausea and vomiting after your treatment, we encourage you to take your nausea medication as directed.  BELOW ARE SYMPTOMS THAT SHOULD BE REPORTED IMMEDIATELY: *FEVER GREATER THAN 100.4 F (38 C) OR HIGHER *CHILLS OR SWEATING *NAUSEA AND VOMITING THAT IS NOT CONTROLLED WITH YOUR NAUSEA MEDICATION *UNUSUAL SHORTNESS OF BREATH *UNUSUAL BRUISING OR BLEEDING *URINARY PROBLEMS (pain or burning when urinating, or frequent urination) *BOWEL PROBLEMS (unusual diarrhea, constipation, pain near the anus) TENDERNESS IN MOUTH AND THROAT WITH OR WITHOUT PRESENCE OF ULCERS (sore throat, sores in mouth, or a toothache) UNUSUAL RASH, SWELLING OR PAIN  UNUSUAL VAGINAL DISCHARGE OR ITCHING   Items with * indicate a potential emergency and should be followed up as soon as possible or go to the Emergency Department if any problems should occur.  Please show the CHEMOTHERAPY ALERT CARD or IMMUNOTHERAPY ALERT CARD at check-in to the Emergency  Department and triage nurse.  Should you have questions after your visit or need to cancel or reschedule your appointment, please contact [facility]  Dept: [phone]  and follow the prompts.  Office hours are 8:00 a.m. to 4:30 p.m. Monday - Friday. Please note that voicemails left after 4:00 p.m. may not be returned until the following business day.  We are closed weekends and major holidays. You have access to a nurse at all times for urgent questions. Please call the main number to the clinic Dept: [phone] and follow the prompts.   For any non-urgent questions, you may also contact your provider using MyChart. We now offer e-Visits for anyone 18 and older to request care online for non-urgent symptoms. For details visit [email or web address].   Also download the MyChart app! Go to the app store, search "MyChart", open the app, select [facility], and log in with your MyChart username and password.  Due to Covid, a mask is required upon entering the hospital/clinic. If you do not have a mask, one will be given to you upon arrival. For doctor visits, patients may have 1 support person aged 18 or older with them. For treatment visits, patients cannot have anyone with them due to current Covid guidelines and our immunocompromised population.   

## 2021-01-03 ENCOUNTER — Inpatient Hospital Stay: Payer: Managed Care, Other (non HMO)

## 2021-01-03 ENCOUNTER — Inpatient Hospital Stay: Payer: Managed Care, Other (non HMO) | Attending: Oncology

## 2021-01-03 ENCOUNTER — Other Ambulatory Visit: Payer: Self-pay

## 2021-01-03 VITALS — BP 117/65 | HR 61 | Resp 16

## 2021-01-03 DIAGNOSIS — Z5112 Encounter for antineoplastic immunotherapy: Secondary | ICD-10-CM | POA: Diagnosis not present

## 2021-01-03 DIAGNOSIS — C50411 Malignant neoplasm of upper-outer quadrant of right female breast: Secondary | ICD-10-CM | POA: Diagnosis present

## 2021-01-03 DIAGNOSIS — C50911 Malignant neoplasm of unspecified site of right female breast: Secondary | ICD-10-CM

## 2021-01-03 DIAGNOSIS — Z170 Estrogen receptor positive status [ER+]: Secondary | ICD-10-CM

## 2021-01-03 LAB — CBC WITH DIFFERENTIAL/PLATELET
Abs Immature Granulocytes: 0.01 10*3/uL (ref 0.00–0.07)
Basophils Absolute: 0.1 10*3/uL (ref 0.0–0.1)
Basophils Relative: 1 %
Eosinophils Absolute: 0.1 10*3/uL (ref 0.0–0.5)
Eosinophils Relative: 2 %
HCT: 39.9 % (ref 36.0–46.0)
Hemoglobin: 13.2 g/dL (ref 12.0–15.0)
Immature Granulocytes: 0 %
Lymphocytes Relative: 34 %
Lymphs Abs: 1.5 10*3/uL (ref 0.7–4.0)
MCH: 28.3 pg (ref 26.0–34.0)
MCHC: 33.1 g/dL (ref 30.0–36.0)
MCV: 85.4 fL (ref 80.0–100.0)
Monocytes Absolute: 0.3 10*3/uL (ref 0.1–1.0)
Monocytes Relative: 7 %
Neutro Abs: 2.4 10*3/uL (ref 1.7–7.7)
Neutrophils Relative %: 56 %
Platelets: 149 10*3/uL — ABNORMAL LOW (ref 150–400)
RBC: 4.67 MIL/uL (ref 3.87–5.11)
RDW: 12.9 % (ref 11.5–15.5)
WBC: 4.3 10*3/uL (ref 4.0–10.5)
nRBC: 0 % (ref 0.0–0.2)

## 2021-01-03 LAB — COMPREHENSIVE METABOLIC PANEL
ALT: 20 U/L (ref 0–44)
AST: 18 U/L (ref 15–41)
Albumin: 3.8 g/dL (ref 3.5–5.0)
Alkaline Phosphatase: 51 U/L (ref 38–126)
Anion gap: 10 (ref 5–15)
BUN: 13 mg/dL (ref 8–23)
CO2: 23 mmol/L (ref 22–32)
Calcium: 9 mg/dL (ref 8.9–10.3)
Chloride: 106 mmol/L (ref 98–111)
Creatinine, Ser: 0.73 mg/dL (ref 0.44–1.00)
GFR, Estimated: >60 mL/min (ref >60)
Glucose, Bld: 95 mg/dL (ref 70–99)
Potassium: 4.5 mmol/L (ref 3.5–5.1)
Sodium: 139 mmol/L (ref 135–145)
Total Bilirubin: 0.5 mg/dL (ref 0.3–1.2)
Total Protein: 6.9 g/dL (ref 6.5–8.1)

## 2021-01-03 MED ORDER — TRASTUZUMAB-ANNS CHEMO 150 MG IV SOLR
6.0000 mg/kg | Freq: Once | INTRAVENOUS | Status: AC
  Administered 2021-01-03: 378 mg via INTRAVENOUS
  Filled 2021-01-03: qty 18

## 2021-01-03 MED ORDER — ACETAMINOPHEN 325 MG PO TABS
650.0000 mg | ORAL_TABLET | Freq: Once | ORAL | Status: AC
Start: 2021-01-03 — End: 2021-01-03
  Administered 2021-01-03: 650 mg via ORAL

## 2021-01-03 MED ORDER — DIPHENHYDRAMINE HCL 25 MG PO CAPS
ORAL_CAPSULE | ORAL | Status: AC
  Filled 2021-01-03: qty 1

## 2021-01-03 MED ORDER — DIPHENHYDRAMINE HCL 25 MG PO CAPS
25.0000 mg | ORAL_CAPSULE | Freq: Once | ORAL | Status: AC
Start: 2021-01-03 — End: 2021-01-03
  Administered 2021-01-03: 25 mg via ORAL

## 2021-01-03 MED ORDER — ACETAMINOPHEN 325 MG PO TABS
ORAL_TABLET | ORAL | Status: AC
  Filled 2021-01-03: qty 2

## 2021-01-03 MED ORDER — SODIUM CHLORIDE 0.9 % IV SOLN
Freq: Once | INTRAVENOUS | Status: AC
  Filled 2021-01-03: qty 250

## 2021-01-03 NOTE — Patient Instructions (Signed)
[facility]  Discharge Instructions: Thank you for choosing [facility] to provide your oncology and hematology care.   If you have a lab appointment with the Cancer Center, please go directly to the Cancer Center and check in at the registration area.   Wear comfortable clothing and clothing appropriate for easy access to any Portacath or PICC line.   We strive to give you quality time with your provider. You may need to reschedule your appointment if you arrive late (15 or more minutes).  Arriving late affects you and other patients whose appointments are after yours.  Also, if you miss three or more appointments without notifying the office, you may be dismissed from the clinic at the provider's discretion.      For prescription refill requests, have your pharmacy contact our office and allow 72 hours for refills to be completed.    Today you received the following chemotherapy and/or immunotherapy agents herceptin      To help prevent nausea and vomiting after your treatment, we encourage you to take your nausea medication as directed.  BELOW ARE SYMPTOMS THAT SHOULD BE REPORTED IMMEDIATELY: *FEVER GREATER THAN 100.4 F (38 C) OR HIGHER *CHILLS OR SWEATING *NAUSEA AND VOMITING THAT IS NOT CONTROLLED WITH YOUR NAUSEA MEDICATION *UNUSUAL SHORTNESS OF BREATH *UNUSUAL BRUISING OR BLEEDING *URINARY PROBLEMS (pain or burning when urinating, or frequent urination) *BOWEL PROBLEMS (unusual diarrhea, constipation, pain near the anus) TENDERNESS IN MOUTH AND THROAT WITH OR WITHOUT PRESENCE OF ULCERS (sore throat, sores in mouth, or a toothache) UNUSUAL RASH, SWELLING OR PAIN  UNUSUAL VAGINAL DISCHARGE OR ITCHING   Items with * indicate a potential emergency and should be followed up as soon as possible or go to the Emergency Department if any problems should occur.  Please show the CHEMOTHERAPY ALERT CARD or IMMUNOTHERAPY ALERT CARD at check-in to  the Emergency Department and triage nurse.  Should you have questions after your visit or need to cancel or reschedule your appointment, please contact [facility]  Dept: [phone]  and follow the prompts.  Office hours are 8:00 a.m. to 4:30 p.m. Monday - Friday. Please note that voicemails left after 4:00 p.m. may not be returned until the following business day.  We are closed weekends and major holidays. You have access to a nurse at all times for urgent questions. Please call the main number to the clinic Dept: [phone] and follow the prompts.   For any non-urgent questions, you may also contact your provider using MyChart. We now offer e-Visits for anyone 18 and older to request care online for non-urgent symptoms. For details visit [email or web address].   Also download the MyChart app! Go to the app store, search "MyChart", open the app, select [facility], and log in with your MyChart username and password.  Due to Covid, a mask is required upon entering the hospital/clinic. If you do not have a mask, one will be given to you upon arrival. For doctor visits, patients may have 1 support person aged 18 or older with them. For treatment visits, patients cannot have anyone with them due to current Covid guidelines and our immunocompromised population.   

## 2021-01-04 ENCOUNTER — Encounter: Payer: Self-pay | Admitting: Oncology

## 2021-01-16 ENCOUNTER — Other Ambulatory Visit (HOSPITAL_COMMUNITY): Payer: Managed Care, Other (non HMO)

## 2021-01-17 ENCOUNTER — Ambulatory Visit (HOSPITAL_COMMUNITY): Payer: Managed Care, Other (non HMO) | Attending: Oncology

## 2021-01-17 ENCOUNTER — Other Ambulatory Visit: Payer: Self-pay

## 2021-01-17 DIAGNOSIS — C50411 Malignant neoplasm of upper-outer quadrant of right female breast: Secondary | ICD-10-CM | POA: Diagnosis not present

## 2021-01-17 DIAGNOSIS — Z01818 Encounter for other preprocedural examination: Secondary | ICD-10-CM | POA: Diagnosis not present

## 2021-01-17 DIAGNOSIS — C50911 Malignant neoplasm of unspecified site of right female breast: Secondary | ICD-10-CM | POA: Insufficient documentation

## 2021-01-17 DIAGNOSIS — Z170 Estrogen receptor positive status [ER+]: Secondary | ICD-10-CM | POA: Diagnosis not present

## 2021-01-17 LAB — ECHOCARDIOGRAM COMPLETE
Area-P 1/2: 5.13 cm2
S' Lateral: 3.4 cm

## 2021-01-24 ENCOUNTER — Inpatient Hospital Stay: Payer: Managed Care, Other (non HMO)

## 2021-01-24 ENCOUNTER — Other Ambulatory Visit: Payer: Self-pay

## 2021-01-24 VITALS — BP 111/68 | HR 63 | Temp 97.8°F | Resp 18 | Ht 64.0 in | Wt 140.4 lb

## 2021-01-24 DIAGNOSIS — Z170 Estrogen receptor positive status [ER+]: Secondary | ICD-10-CM

## 2021-01-24 DIAGNOSIS — Z5112 Encounter for antineoplastic immunotherapy: Secondary | ICD-10-CM | POA: Diagnosis not present

## 2021-01-24 DIAGNOSIS — C50911 Malignant neoplasm of unspecified site of right female breast: Secondary | ICD-10-CM

## 2021-01-24 LAB — CBC WITH DIFFERENTIAL/PLATELET
Abs Immature Granulocytes: 0 10*3/uL (ref 0.00–0.07)
Basophils Absolute: 0.1 10*3/uL (ref 0.0–0.1)
Basophils Relative: 1 %
Eosinophils Absolute: 0.1 10*3/uL (ref 0.0–0.5)
Eosinophils Relative: 2 %
HCT: 35.2 % — ABNORMAL LOW (ref 36.0–46.0)
Hemoglobin: 12.1 g/dL (ref 12.0–15.0)
Immature Granulocytes: 0 %
Lymphocytes Relative: 31 %
Lymphs Abs: 1.3 10*3/uL (ref 0.7–4.0)
MCH: 29.4 pg (ref 26.0–34.0)
MCHC: 34.4 g/dL (ref 30.0–36.0)
MCV: 85.6 fL (ref 80.0–100.0)
Monocytes Absolute: 0.2 10*3/uL (ref 0.1–1.0)
Monocytes Relative: 6 %
Neutro Abs: 2.5 10*3/uL (ref 1.7–7.7)
Neutrophils Relative %: 60 %
Platelets: 129 10*3/uL — ABNORMAL LOW (ref 150–400)
RBC: 4.11 MIL/uL (ref 3.87–5.11)
RDW: 12.9 % (ref 11.5–15.5)
WBC: 4.1 10*3/uL (ref 4.0–10.5)
nRBC: 0 % (ref 0.0–0.2)

## 2021-01-24 LAB — COMPREHENSIVE METABOLIC PANEL
ALT: 21 U/L (ref 0–44)
AST: 19 U/L (ref 15–41)
Albumin: 3.5 g/dL (ref 3.5–5.0)
Alkaline Phosphatase: 46 U/L (ref 38–126)
Anion gap: 10 (ref 5–15)
BUN: 21 mg/dL (ref 8–23)
CO2: 21 mmol/L — ABNORMAL LOW (ref 22–32)
Calcium: 8.8 mg/dL — ABNORMAL LOW (ref 8.9–10.3)
Chloride: 107 mmol/L (ref 98–111)
Creatinine, Ser: 0.8 mg/dL (ref 0.44–1.00)
GFR, Estimated: >60 mL/min (ref >60)
Glucose, Bld: 117 mg/dL — ABNORMAL HIGH (ref 70–99)
Potassium: 4.2 mmol/L (ref 3.5–5.1)
Sodium: 138 mmol/L (ref 135–145)
Total Bilirubin: 0.3 mg/dL (ref 0.3–1.2)
Total Protein: 6.4 g/dL — ABNORMAL LOW (ref 6.5–8.1)

## 2021-01-24 MED ORDER — ACETAMINOPHEN 325 MG PO TABS
ORAL_TABLET | ORAL | Status: AC
  Filled 2021-01-24: qty 2

## 2021-01-24 MED ORDER — ACETAMINOPHEN 325 MG PO TABS
650.0000 mg | ORAL_TABLET | Freq: Once | ORAL | Status: AC
  Administered 2021-01-24: 650 mg via ORAL

## 2021-01-24 MED ORDER — DIPHENHYDRAMINE HCL 25 MG PO CAPS
25.0000 mg | ORAL_CAPSULE | Freq: Once | ORAL | Status: AC
  Administered 2021-01-24: 25 mg via ORAL

## 2021-01-24 MED ORDER — SODIUM CHLORIDE 0.9 % IV SOLN
Freq: Once | INTRAVENOUS | Status: AC
  Filled 2021-01-24: qty 250

## 2021-01-24 MED ORDER — DIPHENHYDRAMINE HCL 25 MG PO CAPS
ORAL_CAPSULE | ORAL | Status: AC
  Filled 2021-01-24: qty 1

## 2021-01-24 MED ORDER — TRASTUZUMAB-ANNS CHEMO 150 MG IV SOLR
6.0000 mg/kg | Freq: Once | INTRAVENOUS | Status: AC
  Administered 2021-01-24: 378 mg via INTRAVENOUS
  Filled 2021-01-24: qty 18

## 2021-01-24 NOTE — Patient Instructions (Signed)
[facility]  Discharge Instructions: Thank you for choosing [facility] to provide your oncology and hematology care.   If you have a lab appointment with the Cancer Center, please go directly to the Cancer Center and check in at the registration area.   Wear comfortable clothing and clothing appropriate for easy access to any Portacath or PICC line.   We strive to give you quality time with your provider. You may need to reschedule your appointment if you arrive late (15 or more minutes).  Arriving late affects you and other patients whose appointments are after yours.  Also, if you miss three or more appointments without notifying the office, you may be dismissed from the clinic at the provider's discretion.      For prescription refill requests, have your pharmacy contact our office and allow 72 hours for refills to be completed.    Today you received the following chemotherapy and/or immunotherapy agents herceptin      To help prevent nausea and vomiting after your treatment, we encourage you to take your nausea medication as directed.  BELOW ARE SYMPTOMS THAT SHOULD BE REPORTED IMMEDIATELY: *FEVER GREATER THAN 100.4 F (38 C) OR HIGHER *CHILLS OR SWEATING *NAUSEA AND VOMITING THAT IS NOT CONTROLLED WITH YOUR NAUSEA MEDICATION *UNUSUAL SHORTNESS OF BREATH *UNUSUAL BRUISING OR BLEEDING *URINARY PROBLEMS (pain or burning when urinating, or frequent urination) *BOWEL PROBLEMS (unusual diarrhea, constipation, pain near the anus) TENDERNESS IN MOUTH AND THROAT WITH OR WITHOUT PRESENCE OF ULCERS (sore throat, sores in mouth, or a toothache) UNUSUAL RASH, SWELLING OR PAIN  UNUSUAL VAGINAL DISCHARGE OR ITCHING   Items with * indicate a potential emergency and should be followed up as soon as possible or go to the Emergency Department if any problems should occur.  Please show the CHEMOTHERAPY ALERT CARD or IMMUNOTHERAPY ALERT CARD at check-in to  the Emergency Department and triage nurse.  Should you have questions after your visit or need to cancel or reschedule your appointment, please contact [facility]  Dept: [phone]  and follow the prompts.  Office hours are 8:00 a.m. to 4:30 p.m. Monday - Friday. Please note that voicemails left after 4:00 p.m. may not be returned until the following business day.  We are closed weekends and major holidays. You have access to a nurse at all times for urgent questions. Please call the main number to the clinic Dept: [phone] and follow the prompts.   For any non-urgent questions, you may also contact your provider using MyChart. We now offer e-Visits for anyone 18 and older to request care online for non-urgent symptoms. For details visit [email or web address].   Also download the MyChart app! Go to the app store, search "MyChart", open the app, select [facility], and log in with your MyChart username and password.  Due to Covid, a mask is required upon entering the hospital/clinic. If you do not have a mask, one will be given to you upon arrival. For doctor visits, patients may have 1 support person aged 18 or older with them. For treatment visits, patients cannot have anyone with them due to current Covid guidelines and our immunocompromised population.   

## 2021-02-14 ENCOUNTER — Other Ambulatory Visit: Payer: Self-pay

## 2021-02-14 ENCOUNTER — Inpatient Hospital Stay: Payer: Managed Care, Other (non HMO)

## 2021-02-14 ENCOUNTER — Inpatient Hospital Stay: Payer: Managed Care, Other (non HMO) | Attending: Oncology

## 2021-02-14 VITALS — BP 130/68 | HR 67 | Temp 97.8°F | Resp 18 | Ht 64.0 in | Wt 140.8 lb

## 2021-02-14 DIAGNOSIS — C50411 Malignant neoplasm of upper-outer quadrant of right female breast: Secondary | ICD-10-CM

## 2021-02-14 DIAGNOSIS — Z170 Estrogen receptor positive status [ER+]: Secondary | ICD-10-CM

## 2021-02-14 DIAGNOSIS — Z5112 Encounter for antineoplastic immunotherapy: Secondary | ICD-10-CM | POA: Insufficient documentation

## 2021-02-14 DIAGNOSIS — C50911 Malignant neoplasm of unspecified site of right female breast: Secondary | ICD-10-CM

## 2021-02-14 LAB — CBC WITH DIFFERENTIAL/PLATELET
Abs Immature Granulocytes: 0.01 10*3/uL (ref 0.00–0.07)
Basophils Absolute: 0 10*3/uL (ref 0.0–0.1)
Basophils Relative: 1 %
Eosinophils Absolute: 0.1 10*3/uL (ref 0.0–0.5)
Eosinophils Relative: 2 %
HCT: 35.1 % — ABNORMAL LOW (ref 36.0–46.0)
Hemoglobin: 12.1 g/dL (ref 12.0–15.0)
Immature Granulocytes: 0 %
Lymphocytes Relative: 35 %
Lymphs Abs: 1.4 10*3/uL (ref 0.7–4.0)
MCH: 29.2 pg (ref 26.0–34.0)
MCHC: 34.5 g/dL (ref 30.0–36.0)
MCV: 84.8 fL (ref 80.0–100.0)
Monocytes Absolute: 0.3 10*3/uL (ref 0.1–1.0)
Monocytes Relative: 8 %
Neutro Abs: 2.3 10*3/uL (ref 1.7–7.7)
Neutrophils Relative %: 54 %
Platelets: 136 10*3/uL — ABNORMAL LOW (ref 150–400)
RBC: 4.14 MIL/uL (ref 3.87–5.11)
RDW: 13 % (ref 11.5–15.5)
WBC: 4.2 10*3/uL (ref 4.0–10.5)
nRBC: 0 % (ref 0.0–0.2)

## 2021-02-14 LAB — CMP (CANCER CENTER ONLY)
ALT: 24 U/L (ref 0–44)
AST: 23 U/L (ref 15–41)
Albumin: 3.7 g/dL (ref 3.5–5.0)
Alkaline Phosphatase: 40 U/L (ref 38–126)
Anion gap: 12 (ref 5–15)
BUN: 17 mg/dL (ref 8–23)
CO2: 22 mmol/L (ref 22–32)
Calcium: 9.1 mg/dL (ref 8.9–10.3)
Chloride: 106 mmol/L (ref 98–111)
Creatinine: 0.77 mg/dL (ref 0.44–1.00)
GFR, Estimated: >60 mL/min (ref >60)
Glucose, Bld: 108 mg/dL — ABNORMAL HIGH (ref 70–99)
Potassium: 3.9 mmol/L (ref 3.5–5.1)
Sodium: 140 mmol/L (ref 135–145)
Total Bilirubin: 0.5 mg/dL (ref 0.3–1.2)
Total Protein: 6.3 g/dL — ABNORMAL LOW (ref 6.5–8.1)

## 2021-02-14 MED ORDER — TRASTUZUMAB-ANNS CHEMO 150 MG IV SOLR
6.0000 mg/kg | Freq: Once | INTRAVENOUS | Status: AC
  Administered 2021-02-14: 378 mg via INTRAVENOUS
  Filled 2021-02-14: qty 18

## 2021-02-14 MED ORDER — DIPHENHYDRAMINE HCL 25 MG PO CAPS
ORAL_CAPSULE | ORAL | Status: AC
  Filled 2021-02-14: qty 1

## 2021-02-14 MED ORDER — DIPHENHYDRAMINE HCL 25 MG PO CAPS
25.0000 mg | ORAL_CAPSULE | Freq: Once | ORAL | Status: AC
  Administered 2021-02-14: 25 mg via ORAL

## 2021-02-14 MED ORDER — ACETAMINOPHEN 325 MG PO TABS
ORAL_TABLET | ORAL | Status: AC
  Filled 2021-02-14: qty 2

## 2021-02-14 MED ORDER — SODIUM CHLORIDE 0.9 % IV SOLN
Freq: Once | INTRAVENOUS | Status: AC
  Filled 2021-02-14: qty 250

## 2021-02-14 MED ORDER — ACETAMINOPHEN 325 MG PO TABS
650.0000 mg | ORAL_TABLET | Freq: Once | ORAL | Status: AC
  Administered 2021-02-14: 650 mg via ORAL

## 2021-03-06 ENCOUNTER — Encounter: Payer: Self-pay | Admitting: *Deleted

## 2021-03-06 DIAGNOSIS — C50911 Malignant neoplasm of unspecified site of right female breast: Secondary | ICD-10-CM

## 2021-03-07 ENCOUNTER — Inpatient Hospital Stay: Payer: Managed Care, Other (non HMO) | Attending: Oncology

## 2021-03-07 ENCOUNTER — Other Ambulatory Visit: Payer: Self-pay

## 2021-03-07 ENCOUNTER — Inpatient Hospital Stay: Payer: Managed Care, Other (non HMO)

## 2021-03-07 ENCOUNTER — Inpatient Hospital Stay (HOSPITAL_BASED_OUTPATIENT_CLINIC_OR_DEPARTMENT_OTHER): Payer: Managed Care, Other (non HMO) | Admitting: Oncology

## 2021-03-07 VITALS — BP 111/67 | HR 66 | Temp 98.1°F | Resp 16 | Ht 64.0 in | Wt 138.4 lb

## 2021-03-07 DIAGNOSIS — C50411 Malignant neoplasm of upper-outer quadrant of right female breast: Secondary | ICD-10-CM | POA: Diagnosis present

## 2021-03-07 DIAGNOSIS — C50911 Malignant neoplasm of unspecified site of right female breast: Secondary | ICD-10-CM | POA: Diagnosis not present

## 2021-03-07 DIAGNOSIS — Z170 Estrogen receptor positive status [ER+]: Secondary | ICD-10-CM | POA: Insufficient documentation

## 2021-03-07 DIAGNOSIS — Z5112 Encounter for antineoplastic immunotherapy: Secondary | ICD-10-CM | POA: Insufficient documentation

## 2021-03-07 LAB — COMPREHENSIVE METABOLIC PANEL
ALT: 22 U/L (ref 0–44)
AST: 21 U/L (ref 15–41)
Albumin: 3.7 g/dL (ref 3.5–5.0)
Alkaline Phosphatase: 48 U/L (ref 38–126)
Anion gap: 9 (ref 5–15)
BUN: 15 mg/dL (ref 8–23)
CO2: 20 mmol/L — ABNORMAL LOW (ref 22–32)
Calcium: 9.2 mg/dL (ref 8.9–10.3)
Chloride: 107 mmol/L (ref 98–111)
Creatinine, Ser: 0.76 mg/dL (ref 0.44–1.00)
GFR, Estimated: >60 mL/min (ref >60)
Glucose, Bld: 100 mg/dL — ABNORMAL HIGH (ref 70–99)
Potassium: 4.1 mmol/L (ref 3.5–5.1)
Sodium: 136 mmol/L (ref 135–145)
Total Bilirubin: 0.4 mg/dL (ref 0.3–1.2)
Total Protein: 6.4 g/dL — ABNORMAL LOW (ref 6.5–8.1)

## 2021-03-07 LAB — CBC WITH DIFFERENTIAL/PLATELET
Abs Immature Granulocytes: 0.02 10*3/uL (ref 0.00–0.07)
Basophils Absolute: 0 10*3/uL (ref 0.0–0.1)
Basophils Relative: 1 %
Eosinophils Absolute: 0.1 10*3/uL (ref 0.0–0.5)
Eosinophils Relative: 1 %
HCT: 36.6 % (ref 36.0–46.0)
Hemoglobin: 12.6 g/dL (ref 12.0–15.0)
Immature Granulocytes: 0 %
Lymphocytes Relative: 27 %
Lymphs Abs: 1.5 10*3/uL (ref 0.7–4.0)
MCH: 29.2 pg (ref 26.0–34.0)
MCHC: 34.4 g/dL (ref 30.0–36.0)
MCV: 84.9 fL (ref 80.0–100.0)
Monocytes Absolute: 0.3 10*3/uL (ref 0.1–1.0)
Monocytes Relative: 5 %
Neutro Abs: 3.6 10*3/uL (ref 1.7–7.7)
Neutrophils Relative %: 66 %
Platelets: 141 10*3/uL — ABNORMAL LOW (ref 150–400)
RBC: 4.31 MIL/uL (ref 3.87–5.11)
RDW: 12.5 % (ref 11.5–15.5)
WBC: 5.4 10*3/uL (ref 4.0–10.5)
nRBC: 0 % (ref 0.0–0.2)

## 2021-03-07 MED ORDER — DIPHENHYDRAMINE HCL 25 MG PO CAPS
25.0000 mg | ORAL_CAPSULE | Freq: Once | ORAL | Status: AC
  Administered 2021-03-07: 25 mg via ORAL
  Filled 2021-03-07: qty 1

## 2021-03-07 MED ORDER — TAMOXIFEN CITRATE 20 MG PO TABS: 20.0000 mg | ORAL_TABLET | Freq: Every day | ORAL | 4 refills | Status: DC

## 2021-03-07 MED ORDER — SODIUM CHLORIDE 0.9 % IV SOLN
Freq: Once | INTRAVENOUS | Status: AC
  Filled 2021-03-07: qty 250

## 2021-03-07 MED ORDER — ACETAMINOPHEN 325 MG PO TABS
650.0000 mg | ORAL_TABLET | Freq: Once | ORAL | Status: AC
  Administered 2021-03-07: 650 mg via ORAL
  Filled 2021-03-07: qty 2

## 2021-03-07 MED ORDER — TRASTUZUMAB-ANNS CHEMO 150 MG IV SOLR
6.0000 mg/kg | Freq: Once | INTRAVENOUS | Status: AC
  Administered 2021-03-07: 378 mg via INTRAVENOUS
  Filled 2021-03-07: qty 18

## 2021-03-07 NOTE — Progress Notes (Signed)
[facility]  Telephone:(336) [phone] Fax:(336) [phone]     ID: Mozel Burdett DOB: 01/15/1956  MR#: [id number]  ITG#:[id number]  Patient Care Team: [name], NP as PCP - General (Family Medicine) [name], RN as Oncology Nurse Navigator [name], RN as Oncology Nurse Navigator Aleksei Goodlin, [name], MD as Consulting Physician (Oncology) [name], MD as Attending Physician (Radiation Oncology) [name], MD as Consulting Physician (General Surgery) Larey Dresser, MD as Consulting Physician (Cardiology) [name], MD OTHER MD:  CHIEF COMPLAINT: Locally advanced estrogen receptor positive breast cancer (s/p right mastectomy)  CURRENT TREATMENT: Completing a year Herceptin, continuing on tamoxifen   INTERVAL HISTORY: Paige Gilbert returns today for follow up and treatment of her locally advanced breast cancer.  She continues on tamoxifen, started 06/26/2020.  She tolerates this well.  She had some symptoms originally but these are not persistent and she is having no problems with hot flashes or vaginal wetness.  She has gained a little weight which she thinks is related to tamoxifen but it generally is unrelated.  She started herceptin and perjeta on 03/15/2020. Perjeta was discontinued following her 5th cycle secondary to diarrhea.  She is tolerating the trastuzumab with no side effects that she is aware of.  Her final dose is scheduled for today  Her most recent echocardiogram on 01/17/2021 showed an ejection fraction of 55-60%.  Since her last visit, she underwent left diagnostic mammography with tomography at [facility] on 12/05/2020 showing: breast density category B; no evidence of malignancy.   REVIEW OF SYSTEMS: Lesle is leading "and normal life".  She works 3 to 4 days a week.  She takes care of her grandchildren.  Her third daughter is getting married this month and she has been very busy with that.  She does not like her  post chemo hair which is matted and Frizzi but there are no bald spots.  She is wearing the same way because she was wearing before.  A detailed review of systems today was otherwise stable.   COVID 19 VACCINATION STATUS: fully vaccinated [employer]), with booster October 2021   HISTORY OF CURRENT ILLNESS: From the original intake note:  Levada Bowersox has noted a difference between her right and left breast for several years.  She last had a mammogram she thinks about 7 or 8 years ago.  More recently it seemed to her that her right breast was becoming more firm.  She brought it to medical attention and underwent bilateral diagnostic mammography with tomography and right breast ultrasonography at [facility] on 07/07/2019 showing: heterogeneously dense breast tissue; right nipple retraction; architectural distortion with microcalcifications and irregular dense tissue in the upper-outer quadrant; at least 3 abnormal right axillary lymph nodes.  Accordingly on 07/13/2019 she proceeded to biopsy of the right breast area in question. The pathology from this procedure ([id number]) showed:   1. Right Breast, upper-outer quadrant, closer to the nipple  - invasive ductal carcinoma with micropapillary features, grade 2  - ductal carcinoma in situ, grade 2-3, solid and cribriform pattern with comedonecrosis  - Prognostic indicators significant for: estrogen receptor, 100% positive with strong staining intensity and progesterone receptor, 0% negative. HER2 equivocal by immunohistochemistry (2+), but negative by fluorescent in situ hybridization with a signals ratio 1.5 and number per cell 3.8.  2. Right Breast, upper-outer quadrant, axillary tail  - invasive ductal carcinoma, grade 2  - ductal carcinoma in situ, grade 2-3  - Prognostic indicators significant for: estrogen  receptor, 100% positive and progesterone receptor, 50% positive, both with strong staining intensity. HER2 equivocal by  immunohistochemistry (2+), but negative by fluorescent in situ hybridization with a signals ratio 1.9 and number per cell 5.3.  The patient's subsequent history is as detailed below.   PAST MEDICAL HISTORY: Past Medical History:  Diagnosis Date   Anemia    right breast ca dx'd 06/2019   chemo   Varicose vein    Varicose veins of left lower extremity    "bluging ones at top- no pain"    PAST SURGICAL HISTORY: Past Surgical History:  Procedure Laterality Date   MASTECTOMY MODIFIED RADICAL Right 02/17/2020   Procedure: RIGHT MASTECTOMY MODIFIED RADICAL;  Surgeon: [name], MD;  Location: [facility];  Service: General;  Laterality: Right;   NO PAST SURGERIES     none     PORT-A-CATH REMOVAL Right 02/17/2020   Procedure: REMOVAL PORT-A-CATH;  Surgeon: [name], MD;  Location: [facility];  Service: General;  Laterality: Right;   PORTACATH PLACEMENT Right 08/17/2019   Procedure: INSERTION PORT-A-CATH WITH ULTRASOUND;  Surgeon: [name], MD;  Location: [facility];  Service: General;  Laterality: Right;   wisdom teeth removal     remotely.     FAMILY HISTORY: Family History  Problem Relation Age of Onset   Diabetes Father    Blindness Mother    Congestive Heart Failure Sister    Gallbladder disease Sister        removed   Diabetes Sister    Colitis Sister   The patient's father died at age 44 from a stroke.  The patient's mother died from multiple medical problems not related to cancer at age 40.  The patient had 4 brothers, 7 sisters.  There is no breast ovarian or prostate cancer in the family to her knowledge.  She had one cousin with pancreatic cancer.   GYNECOLOGIC HISTORY:  No LMP recorded. Patient is postmenopausal. Menarche: 65 years old Age at first live birth: 65 years old [place] P 5 LMP 59 Contraceptive: Brief use of oral contraceptives as a teenager HRT no Hysterectomy?  No BSO?    SOCIAL HISTORY: (updated 07/2019)  Amulya works in a Engineer, agricultural, and  Education administrator houses.  Her husband of 40+ years [name] is a Field seismologist.  Their children are [name], 7, who lives in [place] and is a Veterinary surgeon in the [employer]; [name] 33 who works as a Designer, jewellery at [employer]; [name] who lives in [place], and is not currently employed; [place] lives in [place], and [name] who is an Armed forces training and education officer in [place].  The patient has 6 grandchildren (including 2 foster).      ADVANCED DIRECTIVES: Husband [name] is her HCPOA.   HEALTH MAINTENANCE: Social History   Tobacco Use   Smoking status: Never   Smokeless tobacco: Never  Vaping Use   Vaping Use: Never used  Substance Use Topics   Alcohol use: Yes    Comment: rarely   Drug use: No     Colonoscopy: never done  PAP: Remote  Bone density: Never   No Known Allergies  Current Outpatient Medications  Medication Sig Dispense Refill   Calcium Citrate-Vitamin D (CALCIUM CITRATE + D PO) Take 1 tablet by mouth 3 (three) times a week.      cholecalciferol (VITAMIN D3) 25 MCG (1000 UNIT) tablet Take 1 tablet (1,000 Units total) by mouth daily. 90 tablet 4   ibuprofen (ADVIL) 800 MG tablet Take 1 tablet (  800 mg total) by mouth every 8 (eight) hours as needed. 30 tablet 0   naproxen sodium (ALEVE) 220 MG tablet Take 220 mg by mouth daily as needed (pain).      tamoxifen (NOLVADEX) 20 MG tablet      No current facility-administered medications for this visit.    OBJECTIVE: White woman in no acute distress Vitals:   03/07/21 1318  BP: 111/67  Pulse: 66  Resp: 16  Temp: 98.1 F (36.7 C)  SpO2: 99%     Body mass index is 23.76 kg/m.   Wt Readings from Last 3 Encounters:  03/07/21 138 lb 6.4 oz (62.8 kg)  02/14/21 140 lb 12.8 oz (63.9 kg)  01/24/21 140 lb 6.4 oz (63.7 kg)  ECOG FS:1 - Symptomatic but completely ambulatory  Sclerae unicteric, EOMs intact Wearing a mask No cervical or supraclavicular adenopathy Lungs no rales or rhonchi Heart regular rate  and rhythm Abd soft, nontender, positive bowel sounds MSK no focal spinal tenderness, no upper extremity lymphedema Neuro: nonfocal, well oriented, appropriate affect Breasts: The right breast is status post mastectomy.  There is no evidence of local recurrence.  The left breast and both axillae are benign   LAB RESULTS:  CMP     Component Value Date/Time   NA 140 02/14/2021 1051   K 3.9 02/14/2021 1051   CL 106 02/14/2021 1051   CO2 22 02/14/2021 1051   GLUCOSE 108 (H) 02/14/2021 1051   BUN 17 02/14/2021 1051   CREATININE 0.77 02/14/2021 1051   CREATININE 0.76 03/27/2015 1146   CALCIUM 9.1 02/14/2021 1051   PROT 6.3 (L) 02/14/2021 1051   ALBUMIN 3.7 02/14/2021 1051   AST 23 02/14/2021 1051   ALT 24 02/14/2021 1051   ALKPHOS 40 02/14/2021 1051   BILITOT 0.5 02/14/2021 1051   GFRNONAA >60 02/14/2021 1051   GFRNONAA 87 03/27/2015 1146   GFRAA >60 04/26/2020 1213   GFRAA >60 08/04/2019 1546   GFRAA >89 03/27/2015 1146    No results found for: TOTALPROTELP, ALBUMINELP, A1GS, A2GS, BETS, BETA2SER, GAMS, MSPIKE, SPEI  Lab Results  Component Value Date   WBC 5.4 03/07/2021   NEUTROABS 3.6 03/07/2021   HGB 12.6 03/07/2021   HCT 36.6 03/07/2021   MCV 84.9 03/07/2021   PLT 141 (L) 03/07/2021    No results found for: LABCA2  No components found for: JSEGBT517  No results for input(s): INR in the last 168 hours.  No results found for: LABCA2  No results found for: OHY073  No results found for: XTG626  No results found for: RSW546  Lab Results  Component Value Date   CA2729 27.4 08/18/2019    No components found for: HGQUANT  No results found for: CEA1 / No results found for: CEA1   No results found for: AFPTUMOR  No results found for: CHROMOGRNA  No results found for: KPAFRELGTCHN, LAMBDASER, KAPLAMBRATIO (kappa/lambda light chains)  No results found for: HGBA, HGBA2QUANT, HGBFQUANT, HGBSQUAN (Hemoglobinopathy evaluation)   No results found for:  LDH  Lab Results  Component Value Date   IRON 94 02/15/2015   TIBC 272 02/15/2015   IRONPCTSAT 35 02/15/2015   (Iron and TIBC)  Lab Results  Component Value Date   FERRITIN 70 02/15/2015    Urinalysis No results found for: COLORURINE, APPEARANCEUR, LABSPEC, PHURINE, GLUCOSEU, HGBUR, BILIRUBINUR, KETONESUR, PROTEINUR, UROBILINOGEN, NITRITE, LEUKOCYTESUR   STUDIES: No results found.   ELIGIBLE FOR AVAILABLE RESEARCH PROTOCOL:   ASSESSMENT: 65 y.o. [place], [place] woman  status post right breast biopsy x2 on 07/13/2019 for a clinical T3-T4 N1-2, stage III invasive ductal carcinoma, grade 2, estrogen receptor positive, progesterone receptor variable, HER-2 not amplified.  (a) breast MRI 08/15/2019 showed a 7.6 area of enhancement, and 5 suspicious right axillary lymph nodes as well as two 0.5 mm additional masses in the inferior right breast   (b) CT chest 08/10/2019 showed no evidence of metastatic disease  (c) bone scan 08/27/2019 showed a single focus of activity at T9,  (d) thoracic spine MRI 09/13/2019 showed degenerative changes, no evidence of metastatic disease  (1) neoadjuvant chemotherapy consisting of cyclophosphamide and doxorubicin in dose dense fashion x4 starting 08/18/2019, completed 09/28/2019, followed by weekly paclitaxel x12 starting 10/12/2019, completed 01/11/2020  (2) status post right modified radical mastectomy 02/17/2020 for a residual ypT3 ypN2 invasive ductal carcinoma, with negative margins, estrogen and progesterone receptor positive, and now also HER-2 positive with a ratio of 2.98, number per cell 7.08  (3) adjuvant radiation completed 05/15/2020  (4) tamoxifen started 06/25/2020  (5) trastuzumab/pertuzumab started 03/15/2020, last dose 03/07/2021  (a) echocardiogram 03/10/2020 shows an ejection fraction in the 60-65% range  (b) pertuzumab discontinued after the first 5 cycles secondary to diarrhea  (c) echo 07/06/2020 shows an ejection fraction in  the 55-60% range  (d) echo 10/04/2020 shows an ejection fraction in the 60-65% range  (e) echo 01/17/2021 shows an ejection fraction in the 55-60% range.   PLAN: Drishti is now just over a year out from definitive surgery for her breast cancer with no evidence of disease recurrence.  This is very favorable.  She is tolerating tamoxifen well the plan is to continue that a minimum of 5 years.  Given her significant residual disease after neoadjuvant chemotherapy, continuing for 10 years might be prudent.  She wanted to know how to tell if and when she had a recurrence.  We discussed the fact that scans obtained in the absence of any symptoms frequently lead to false positives and anyway she "does not want scans".  If she has a symptom that worries her and does not clear or at least significantly improve over 3 to 4 days she will let us know and we will evaluated aggressively.  She will have her next mammogram in May and we will see her again June 2023.  Total encounter time 25 minutes.Sarajane Jews C. Grabiel Schmutz, MD 03/07/21 1:21 PM Medical Oncology and Hematology [facility] [place], [place] [address] Tel. [phone]    Fax. [phone]   I, Wilburn Mylar, am acting as scribe for Dr. [name]. Alixis Harmon.  I, Lurline Del MD, have reviewed the above documentation for accuracy and completeness, and I agree with the above.   *Total Encounter Time as defined by the Centers for Medicare and Medicaid Services includes, in addition to the face-to-face time of a patient visit (documented in the note above) non-face-to-face time: obtaining and reviewing outside history, ordering and reviewing medications, tests or procedures, care coordination (communications with other health care professionals or caregivers) and documentation in the medical record.

## 2021-03-07 NOTE — Patient Instructions (Signed)
[facility]  Discharge Instructions: Thank you for choosing [facility] to provide your oncology and hematology care.   If you have a lab appointment with the Cancer Center, please go directly to the Cancer Center and check in at the registration area.   Wear comfortable clothing and clothing appropriate for easy access to any Portacath or PICC line.   We strive to give you quality time with your provider. You may need to reschedule your appointment if you arrive late (15 or more minutes).  Arriving late affects you and other patients whose appointments are after yours.  Also, if you miss three or more appointments without notifying the office, you may be dismissed from the clinic at the provider's discretion.      For prescription refill requests, have your pharmacy contact our office and allow 72 hours for refills to be completed.    Today you received the following chemotherapy and/or immunotherapy agents : Herceptin     To help prevent nausea and vomiting after your treatment, we encourage you to take your nausea medication as directed.  BELOW ARE SYMPTOMS THAT SHOULD BE REPORTED IMMEDIATELY: *FEVER GREATER THAN 100.4 F (38 C) OR HIGHER *CHILLS OR SWEATING *NAUSEA AND VOMITING THAT IS NOT CONTROLLED WITH YOUR NAUSEA MEDICATION *UNUSUAL SHORTNESS OF BREATH *UNUSUAL BRUISING OR BLEEDING *URINARY PROBLEMS (pain or burning when urinating, or frequent urination) *BOWEL PROBLEMS (unusual diarrhea, constipation, pain near the anus) TENDERNESS IN MOUTH AND THROAT WITH OR WITHOUT PRESENCE OF ULCERS (sore throat, sores in mouth, or a toothache) UNUSUAL RASH, SWELLING OR PAIN  UNUSUAL VAGINAL DISCHARGE OR ITCHING   Items with * indicate a potential emergency and should be followed up as soon as possible or go to the Emergency Department if any problems should occur.  Please show the CHEMOTHERAPY ALERT CARD or IMMUNOTHERAPY ALERT CARD at check-in to  the Emergency Department and triage nurse.  Should you have questions after your visit or need to cancel or reschedule your appointment, please contact [facility]  Dept: [phone]  and follow the prompts.  Office hours are 8:00 a.m. to 4:30 p.m. Monday - Friday. Please note that voicemails left after 4:00 p.m. may not be returned until the following business day.  We are closed weekends and major holidays. You have access to a nurse at all times for urgent questions. Please call the main number to the clinic Dept: [phone] and follow the prompts.   For any non-urgent questions, you may also contact your provider using MyChart. We now offer e-Visits for anyone 18 and older to request care online for non-urgent symptoms. For details visit [email or web address].   Also download the MyChart app! Go to the app store, search "MyChart", open the app, select [facility], and log in with your MyChart username and password.  Due to Covid, a mask is required upon entering the hospital/clinic. If you do not have a mask, one will be given to you upon arrival. For doctor visits, patients may have 1 support person aged 18 or older with them. For treatment visits, patients cannot have anyone with them due to current Covid guidelines and our immunocompromised population.   

## 2021-04-11 ENCOUNTER — Telehealth: Payer: Self-pay | Admitting: Adult Health

## 2021-04-11 NOTE — Telephone Encounter (Signed)
Scheduled per sch msg. Called and left msg  

## 2021-05-09 ENCOUNTER — Telehealth: Payer: Self-pay

## 2021-05-09 NOTE — Telephone Encounter (Signed)
Spoke with the patient on the phone. Advised to not have any injections in her right arm. Also advised she not get both the COVED and the Flu shot at the same time in her left arm. Patient verbalized understand and was appreciative of the call.

## 2021-07-11 ENCOUNTER — Inpatient Hospital Stay: Payer: Managed Care, Other (non HMO) | Admitting: Adult Health

## 2021-07-14 IMAGING — NM NM BONE WHOLE BODY
2 series · 2 of 2 positions shown · non-contrast
Comparison: None.

CLINICAL DATA: Recent breast cancer diagnosis.

EXAM:
NUCLEAR MEDICINE WHOLE BODY BONE SCAN
TECHNIQUE: Whole body anterior and posterior images were obtained approximately
3 hours after intravenous injection of radiopharmaceutical.
RADIOPHARMACEUTICALS:  18.8 mCi 1echnetium-MMm MDP IV

[Series 1: wbr_bone_40 whole body · 2.66mm/px · 1 of 1 slices shown (1 of 2)]
[im 1/1]
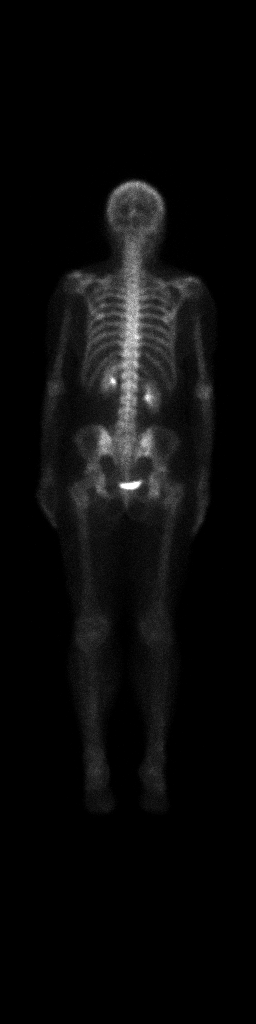

[Series 1: wbr_bone_40 whole body · 2.66mm/px · 1 of 1 slices shown (2 of 2)]
[im 1/1]
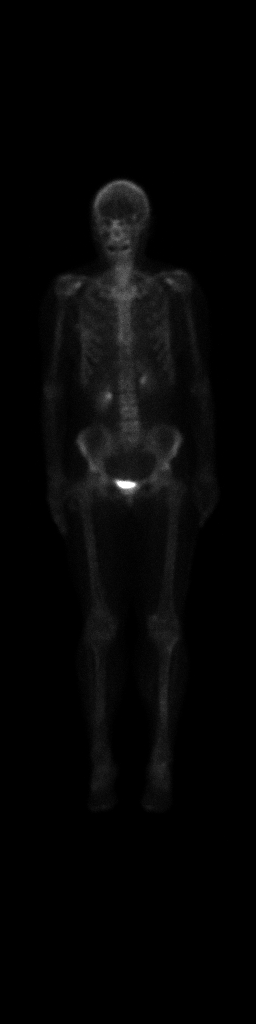

[2 of 2 positions shown; findings below may reference images not displayed]

FINDINGS: Mild diffusely increased tracer activity seen within the bilateral
knees, likely degenerative in origin.

A small focus of increased tracer activity is seen at the
approximate level of the T9 vertebral body on the right.

Physiologic tracer activity is seen throughout the remainder of the
osseous skeleton.

Physiologic tracer uptake is also seen within both kidneys.
IMPRESSION: 1. Single focus of increased tracer uptake at the approximate level
of the T9 vertebral body. A single metastatic focus cannot be
excluded.
2. Degenerative changes within the bilateral knees.

## 2021-07-31 IMAGING — MR MR THORACIC SPINE WO/W CM
11 of 22 series · 17 of 48 positions shown · IV contrast (gadavist)
Comparison: Bone scan August 27, 2019.

CLINICAL DATA: Recent diagnosis of breast cancer. Evaluate T9
lesion seen on bone scan.

EXAM:
MRI THORACIC WITHOUT AND WITH CONTRAST
TECHNIQUE: Multiplanar and multiecho pulse sequences of the thoracic spine were
obtained without and with intravenous contrast.
CONTRAST:  6mL GADAVIST GADOBUTROL 1 MMOL/ML IV SOLN

[Series 16: T1 · sagittal · 4.0mm · 1.72mm/px · 1 of 5 slices shown (1 of 3)]
[im 1/5]
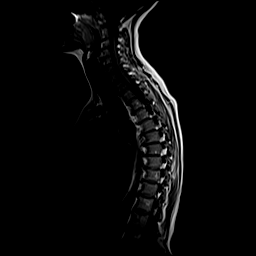

[Series 17: STIR · sagittal · 3.0mm · 1.00mm/px · 1 of 17 slices shown]
[im 1/17]
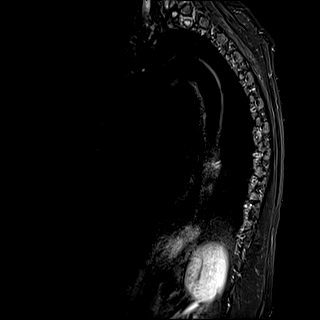

[Series 18: T1 · sagittal · 3.0mm · 1.00mm/px · 1 of 15 slices shown (2 of 3)]
[im 1/15]
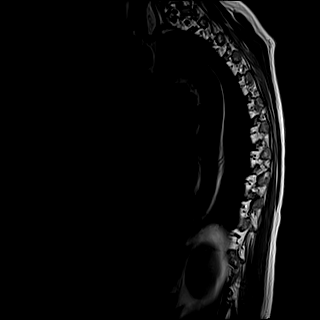

[Series 19: T2 · sagittal · 3.0mm · 0.83mm/px · 1 of 17 slices shown (1 of 2)]
[im 1/17]
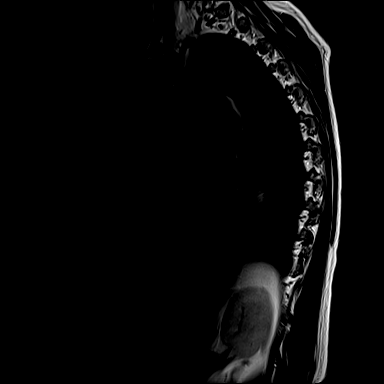

[Series 20: T2 · axial · 4.0mm · 0.78mm/px · z∈[-295,-106]mm · 2 of 39 slices shown (2 of 2)]
[im 1/39]
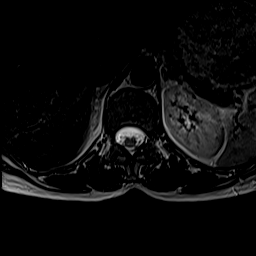
[im 39/39]
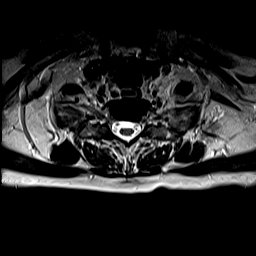

[Series 21: t2_me2d_tra · axial · 4.0mm · 0.39mm/px · z∈[-295,-106]mm · 2 of 39 slices shown (1 of 3)]
[im 1/39]
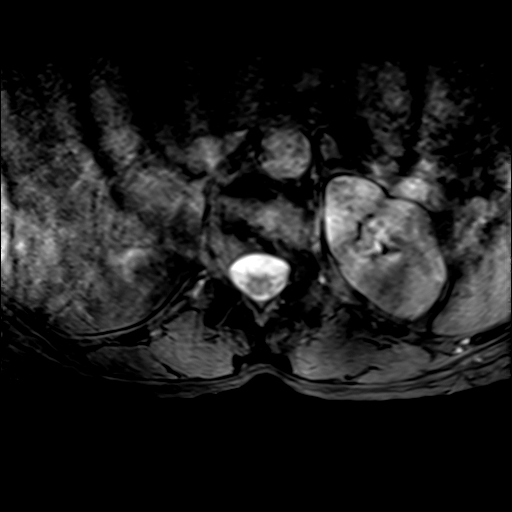
[im 39/39]
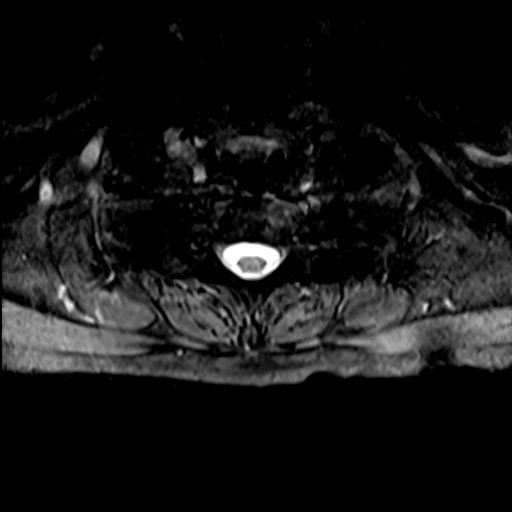

[Series 22: T1 · axial · 4.0mm · 0.39mm/px · z∈[-295,-106]mm · 2 of 39 slices shown (3 of 3)]
[im 1/39]
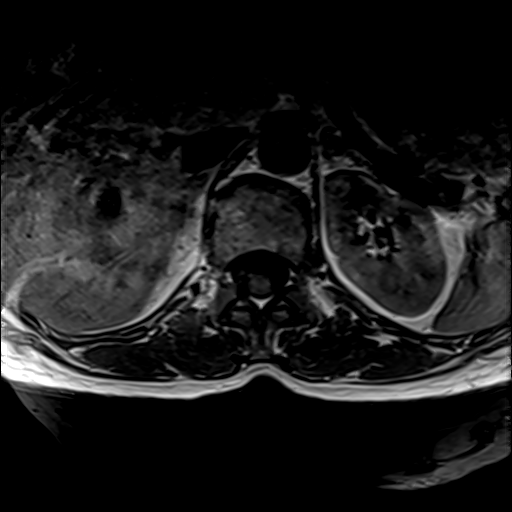
[im 39/39]
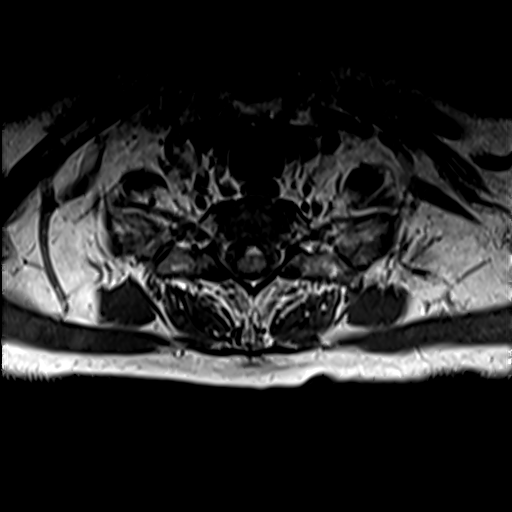

[Series 23: t2_me2d_tra · axial · 4.0mm · 0.39mm/px · z∈[-295,-105]mm · 2 of 39 slices shown (2 of 3)]
[im 1/39]
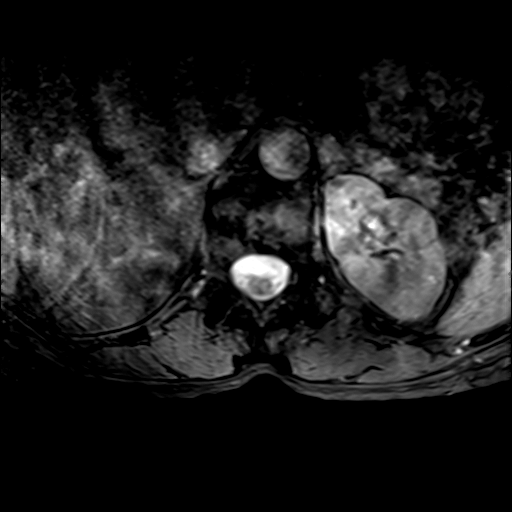
[im 39/39]
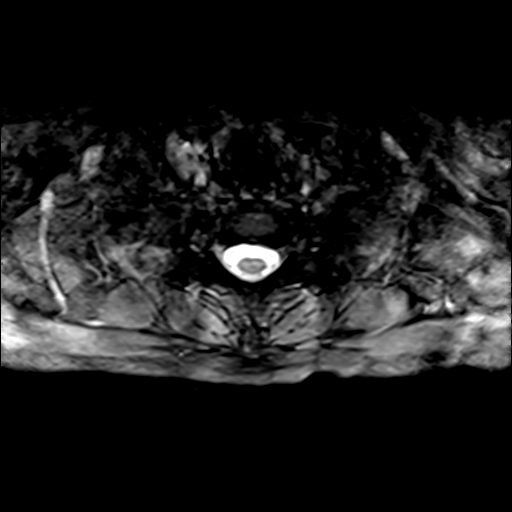

[Series 24: t2_me2d_tra · axial · 4.0mm · 0.39mm/px · z∈[-295,-105]mm · 2 of 39 slices shown (3 of 3)]
[im 1/39]
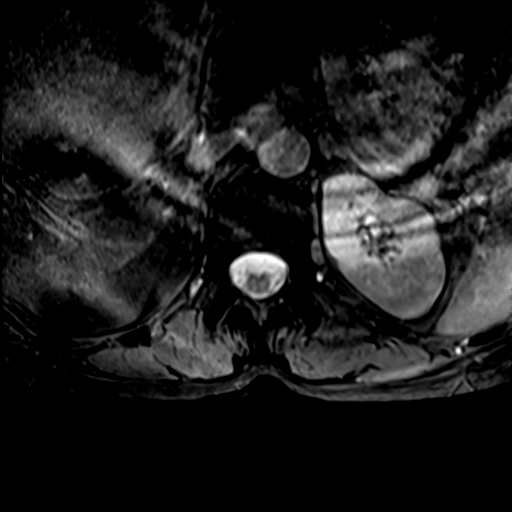
[im 39/39]
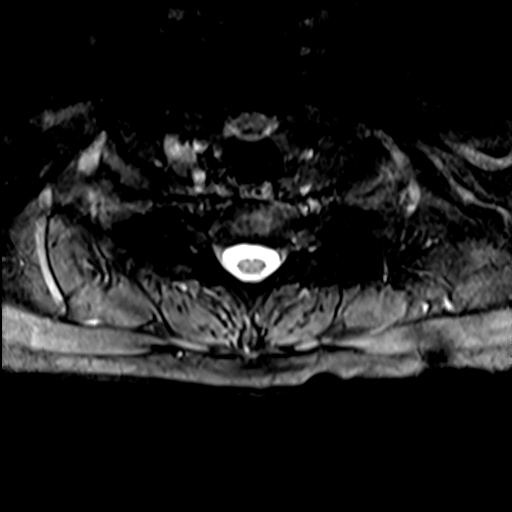

[Series 25: T1 fat-sat post-contrast · sagittal · 3.0mm · 1.00mm/px · 1 of 15 slices shown]
[im 1/15]
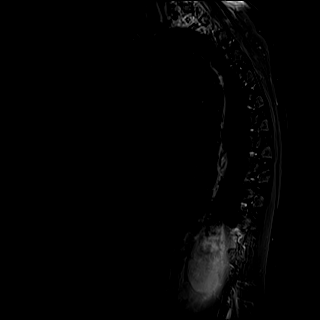

[Series 26: T1 post-contrast · axial · 4.0mm · 0.39mm/px · z∈[-295,-106]mm · 2 of 39 slices shown]
[im 1/39]
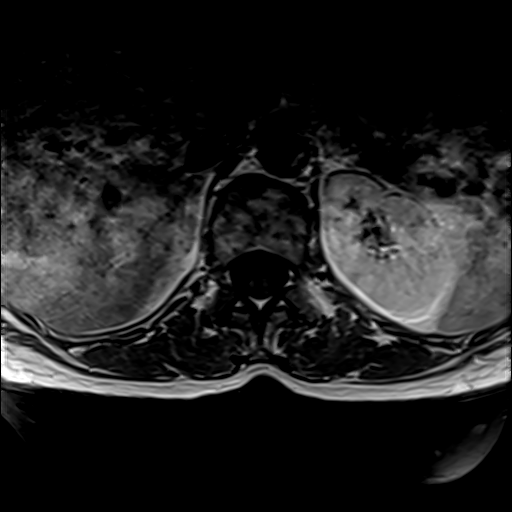
[im 39/39]
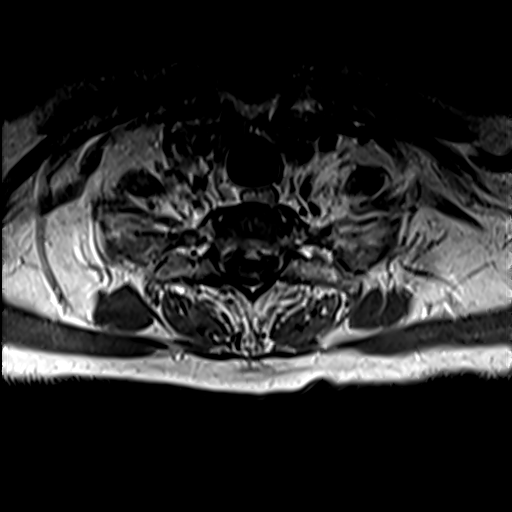

[17 of 48 positions shown; findings below may reference images not displayed]

FINDINGS: MRI THORACIC SPINE FINDINGS

Alignment:  Physiologic.

Vertebrae: No fracture, evidence of discitis, or bone lesion. There
is mild diffuse decrease of the T1 signal throughout the vertebral
bodies, may be related to anemia. No focal bone lesion identified at
T9 . There is also no suspicious focal bone lesion or abnormal
contrast enhancement at any other thoracic spine level. Are

Cord:  Normal signal and morphology.

Paraspinal and other soft tissues: Negative.

Disc levels: No significant disc bulge or herniation, spinal canal
or neural foraminal stenosis seen at any level. Mild facet
degenerative changes are seen at T9-T10 and T10-T11.
IMPRESSION: 1. No evidence of metastatic disease of the thoracic spine.
2. Mild degenerative facet disease at T9-T10 and T10-T11.

## 2021-09-18 IMAGING — US US BREAST*R* LIMITED INC AXILLA
1 series · 13 of 22 positions shown · non-contrast
Comparison: Previous right breast and axillary ultrasound performed
at [REDACTED] [REDACTED] in [HOSPITAL], [facility] [HOSPITAL] on
07/07/2019.

CLINICAL DATA: Follow-up right breast cancer and metastatic
adenopathy treated with neoadjuvant chemotherapy.

EXAM:
ULTRASOUND OF THE RIGHT BREAST

[Series 1: us breast*right* limited inc axilla · 0.06mm/px · 13 of 22 slices shown]
[im 1/22]
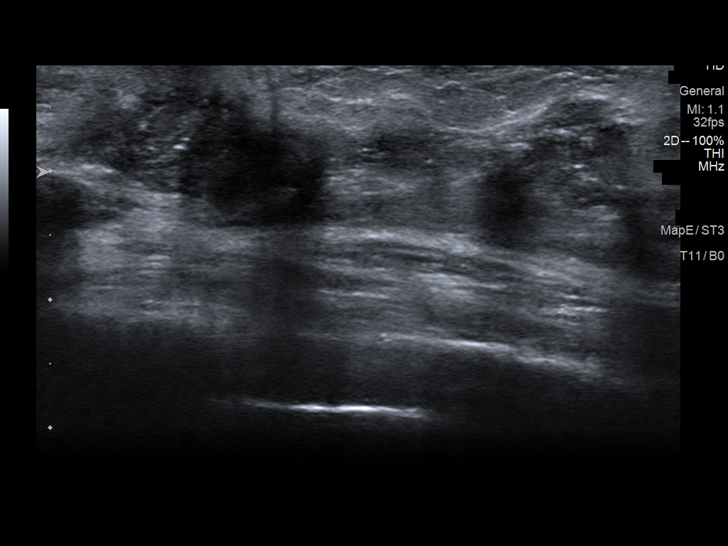
[im 3/22]
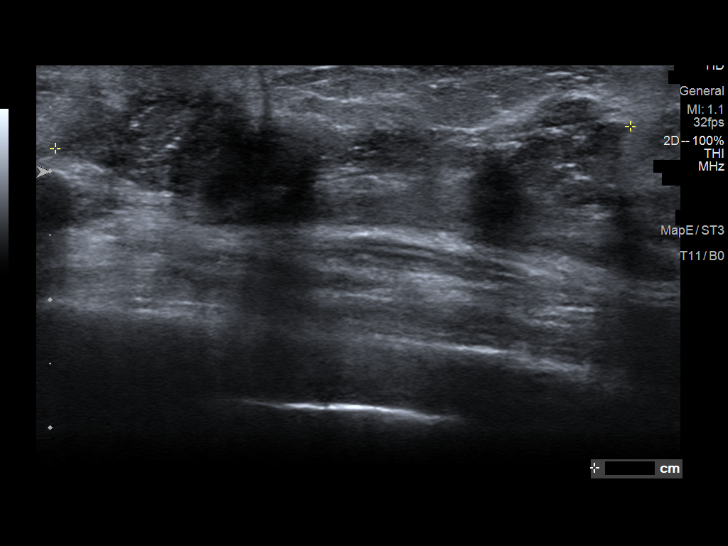
[im 5/22]
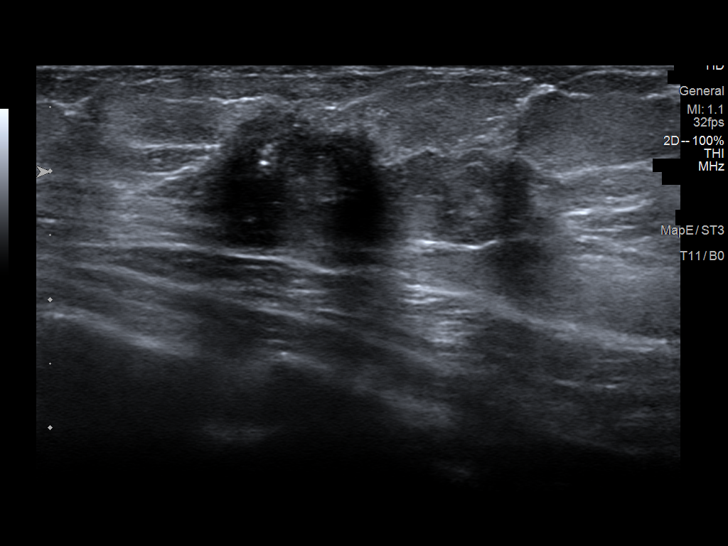
[im 6/22]
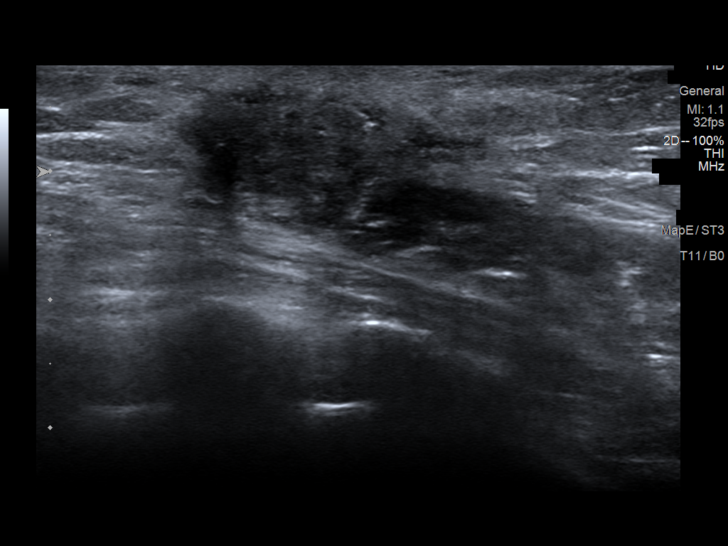
[im 8/22]
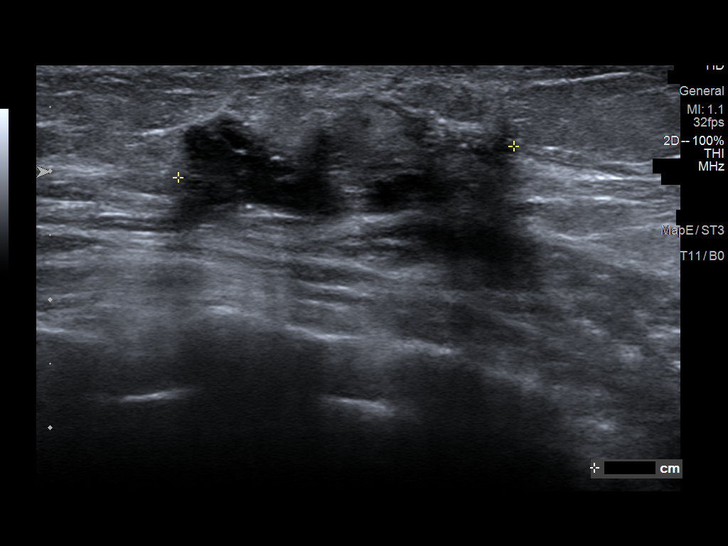
[im 10/22]
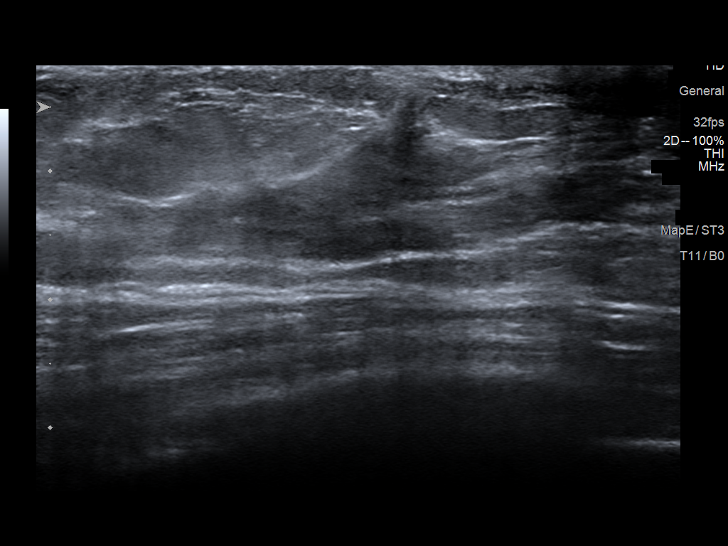
[im 12/22]
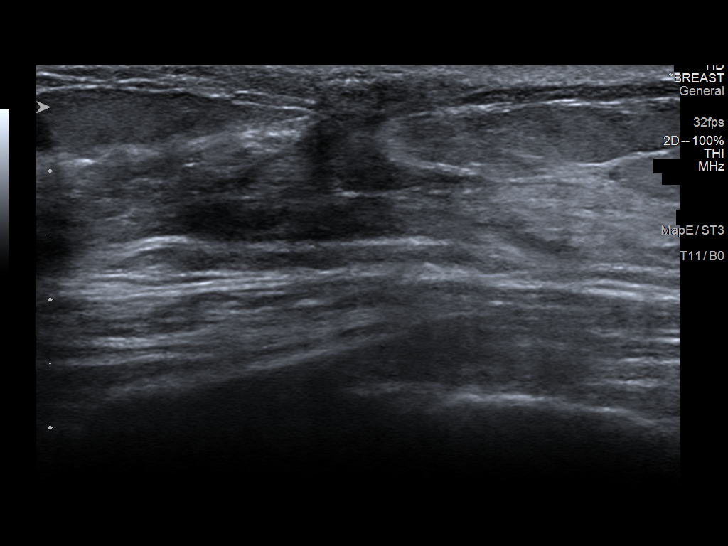
[im 13/22]
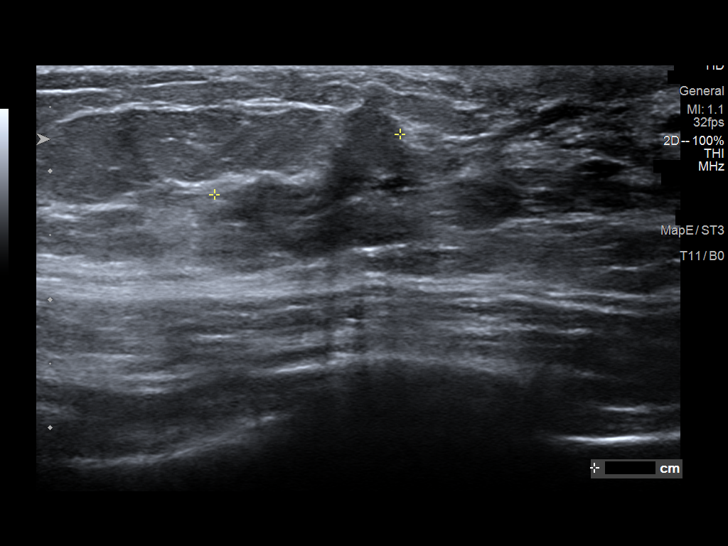
[im 15/22]
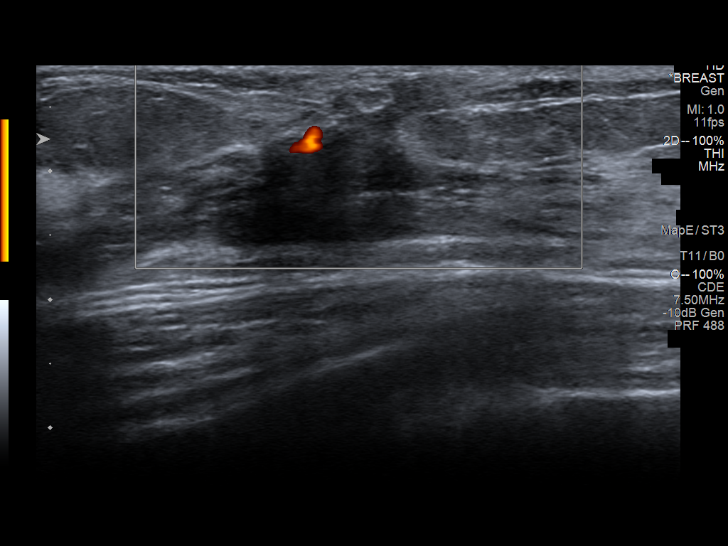
[im 17/22]
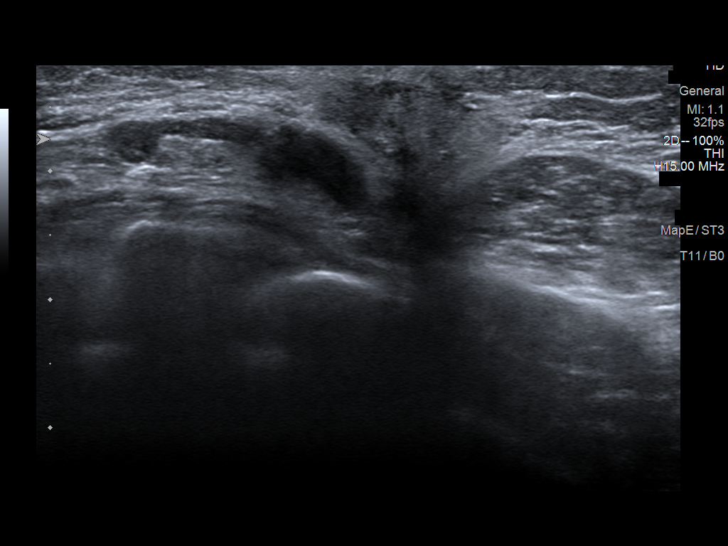
[im 18/22]
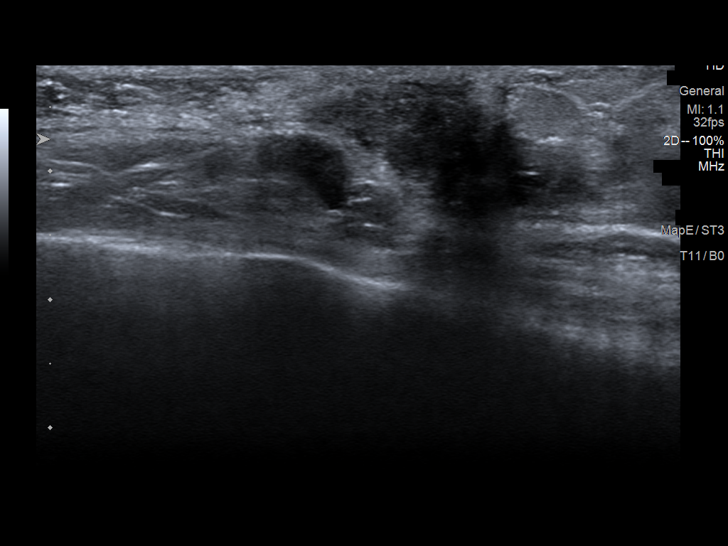
[im 20/22]
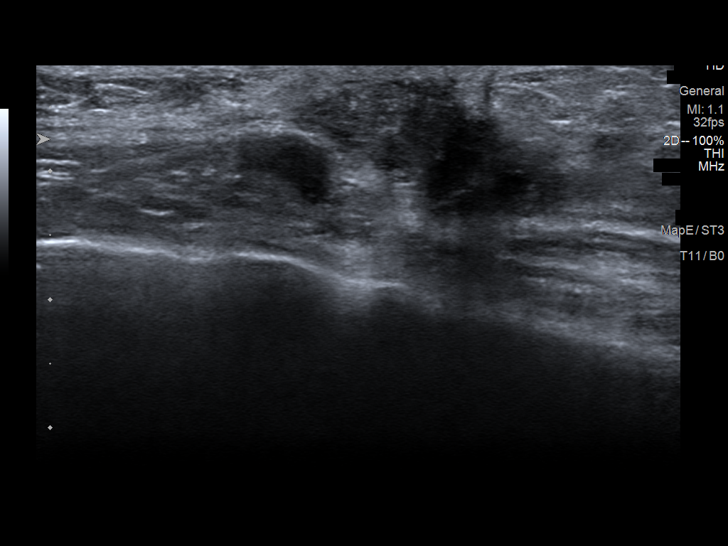
[im 22/22]
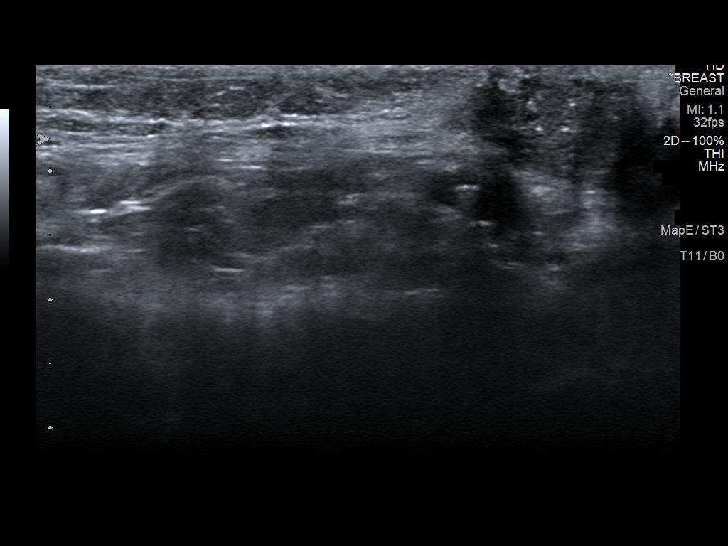

[13 of 22 positions shown; findings below may reference images not displayed]

FINDINGS: Targeted ultrasound is performed and again demonstrates irregular
hypoechoic areas extending from the right nipple to the right axilla
with approximately 4 right axillary lymph nodes with abnormal
cortical thickening. The cortical thickening has improved.
Previously, the largest node had a maximum cortical thickness of
mm and currently has a maximum cortical thickness of 3.5 mm.

A previously measured 4.5 x 3.0 x 1.7 cm irregular hypoechoic area
in the 10 o'clock position of the right breast, 7 cm from the
nipple, currently measures 4.5 x 2.6 x 0.9 cm. This corresponds to a
palpable mass today and previously.

A previously measured 3.3 x 2.2 x 1.6 cm irregular hypoechoic area
in the right breast in the 12 o'clock position, 1 cm from the nipple
currently measures 1.5 x 1.1 x 0.9 cm. No new abnormalities are
seen.
IMPRESSION: Interval decrease in size of the irregular hypoechoic areas in the
right breast and right axillary lymph nodes with cortical
thickening, as described above.

RECOMMENDATION:
Treatment plan.

I have discussed the findings and recommendations with the patient.
If applicable, a reminder letter will be sent to the patient
regarding the next appointment.

BI-RADS CATEGORY  6: Known biopsy-proven malignancy.

## 2021-10-03 ENCOUNTER — Telehealth: Payer: Self-pay | Admitting: Adult Health

## 2021-10-03 NOTE — Telephone Encounter (Signed)
Scheduled appointment per inbasket message. Left message.   ?

## 2021-11-12 ENCOUNTER — Inpatient Hospital Stay: Payer: Managed Care, Other (non HMO) | Attending: Adult Health | Admitting: Adult Health

## 2021-11-12 ENCOUNTER — Telehealth: Payer: Self-pay

## 2021-11-12 NOTE — Telephone Encounter (Signed)
Called pt, left VM regarding missed appt for survivorship.  ?

## 2021-12-19 ENCOUNTER — Telehealth: Payer: Self-pay | Admitting: Adult Health

## 2021-12-19 NOTE — Telephone Encounter (Signed)
.  Called patient to schedule appointment per 5/24 inbasket, patient is aware of date and time.   

## 2022-01-01 ENCOUNTER — Other Ambulatory Visit: Payer: Self-pay | Admitting: Oncology

## 2022-01-07 ENCOUNTER — Encounter: Payer: Self-pay | Admitting: Adult Health

## 2022-01-07 ENCOUNTER — Inpatient Hospital Stay: Payer: Managed Care, Other (non HMO) | Attending: Adult Health | Admitting: Adult Health

## 2022-01-07 ENCOUNTER — Other Ambulatory Visit: Payer: Self-pay

## 2022-01-07 DIAGNOSIS — C50411 Malignant neoplasm of upper-outer quadrant of right female breast: Secondary | ICD-10-CM | POA: Diagnosis present

## 2022-01-07 DIAGNOSIS — C50911 Malignant neoplasm of unspecified site of right female breast: Secondary | ICD-10-CM

## 2022-01-07 DIAGNOSIS — Z170 Estrogen receptor positive status [ER+]: Secondary | ICD-10-CM

## 2022-01-07 NOTE — Progress Notes (Signed)
SURVIVORSHIP VISIT:   BRIEF ONCOLOGIC HISTORY:  Oncology History  Malignant neoplasm of upper-outer quadrant of right breast in female, estrogen receptor positive ([place])  08/03/2019 Initial Diagnosis   Malignant neoplasm of upper-outer quadrant of right breast in female, estrogen receptor positive ([place])   08/18/2019 - 01/11/2020 Chemotherapy   The patient had dexamethasone (DECADRON) 4 MG tablet, 1 of 1 cycle, Start date: 08/12/2019, End date: 10/12/2019 DOXOrubicin (ADRIAMYCIN) chemo injection 102 mg, 60 mg/m2 = 102 mg, Intravenous,  Once, 4 of 4 cycles Administration: 102 mg (08/18/2019), 102 mg (08/31/2019), 102 mg (09/14/2019), 102 mg (09/28/2019) palonosetron (ALOXI) injection 0.25 mg, 0.25 mg, Intravenous,  Once, 4 of 4 cycles Administration: 0.25 mg (08/18/2019), 0.25 mg (08/31/2019), 0.25 mg (09/14/2019), 0.25 mg (09/28/2019) pegfilgrastim-jmdb (FULPHILA) injection 6 mg, 6 mg, Subcutaneous,  Once, 1 of 1 cycle cyclophosphamide (CYTOXAN) 1,020 mg in sodium chloride 0.9 % 250 mL chemo infusion, 600 mg/m2 = 1,020 mg, Intravenous,  Once, 4 of 4 cycles Administration: 1,020 mg (08/18/2019), 1,020 mg (08/31/2019), 1,020 mg (09/14/2019), 1,020 mg (09/28/2019) PACLitaxel (TAXOL) 138 mg in sodium chloride 0.9 % 250 mL chemo infusion (</= 69m/m2), 80 mg/m2 = 138 mg, Intravenous,  Once, 12 of 12 cycles Administration: 138 mg (10/12/2019), 138 mg (10/26/2019), 138 mg (11/02/2019), 138 mg (11/16/2019), 138 mg (11/23/2019), 138 mg (11/30/2019), 138 mg (12/07/2019), 138 mg (12/14/2019), 138 mg (12/21/2019), 138 mg (12/28/2019), 138 mg (01/05/2020), 138 mg (01/11/2020) fosaprepitant (EMEND) 150 mg in sodium chloride 0.9 % 145 mL IVPB, 150 mg, Intravenous,  Once, 4 of 4 cycles Administration: 150 mg (08/18/2019), 150 mg (08/31/2019), 150 mg (09/14/2019), 150 mg (09/28/2019)  for chemotherapy treatment.    02/17/2020 Cancer Staging   Staging form: Breast, AJCC 8th Edition - Pathologic stage from 02/17/2020: No Stage Recommended (ypT3, pN2a, cM0,  G2, ER+, PR+, HER2+) - Signed by CGardenia Phlegm NP on 03/01/2020   02/17/2020 Surgery   BREAST, RIGHT, MODIFIED RADICAL MASTECTOMY:  - Residual invasive and in situ ductal carcinoma status post neoadjuvant  treatment.  - Margins of resection are not involved (Closest margin: < 1 mm, deep).  - Metastatic carcinoma in (7) of (9) lymph nodes.    03/15/2020 - 03/07/2021 Chemotherapy      Patient is on Antibody Plan: BREAST TRASTUZUMAB Q21D     04/04/2020 - 05/15/2020 Radiation Therapy   Site Technique Total Dose (Gy) Dose per Fx (Gy) Completed Fx Beam Energies  Chest Wall, Right: CW_Rt_IM_LN 3D 50/50 2 25/25 6X, 10X  Sclav-RT: SCV_Rt_PAB 3D 50/50 2 25/25 6X, 10X  Chest Wall, Right: CW_Rt_Bst Electron 10/10 2 5/5 6E     06/25/2020 -  Anti-estrogen oral therapy   Tamoxifen to be continued for a minimum of 5 years   Right breast cancer with T3 tumor, >5 cm in greatest dimension [place]  02/17/2020 Initial Diagnosis   Right breast cancer with T3 tumor, >5 cm in greatest [place] ([place]   02/17/2020 Surgery   BREAST, RIGHT, MODIFIED RADICAL MASTECTOMY:  - Residual invasive and in situ ductal carcinoma status post neoadjuvant  treatment.  - Margins of resection are not involved (Closest margin: < 1 mm, deep).  - Metastatic carcinoma in (7) of (9) lymph nodes.    03/15/2020 - 03/07/2021 Chemotherapy      Patient is on Antibody Plan: BREAST TRASTUZUMAB Q21D     04/04/2020 - 05/15/2020 Radiation Therapy   Site Technique Total Dose (Gy) Dose per Fx (Gy) Completed Fx Beam Energies  Chest Wall, Right: CW_Rt_IM_LN 3D 50/50 2  25/25 6X, 10X  Sclav-RT: SCV_Rt_PAB 3D 50/50 2 25/25 6X, 10X  Chest Wall, Right: CW_Rt_Bst Electron 10/10 2 5/5 6E     06/25/2020 -  Anti-estrogen oral therapy   Tamoxifen to be continued for a minimum of 5 years     INTERVAL HISTORY:  Paige Gilbert to review her survivorship care plan detailing her treatment course for breast cancer, as well as monitoring  long-term side effects of that treatment, education regarding health maintenance, screening, and overall wellness and health promotion.     Overall, Paige Gilbert reports feeling quite well   REVIEW OF SYSTEMS:  Review of Systems  Constitutional:  Negative for appetite change, chills, fatigue, fever and unexpected weight change.  HENT:   Negative for hearing loss, lump/mass and trouble swallowing.   Eyes:  Negative for eye problems and icterus.  Respiratory:  Negative for chest tightness, cough and shortness of breath.   Cardiovascular:  Negative for chest pain, leg swelling and palpitations.  Gastrointestinal:  Negative for abdominal distention, abdominal pain, constipation, diarrhea, nausea and vomiting.  Endocrine: Negative for hot flashes.  Genitourinary:  Negative for difficulty urinating.   Musculoskeletal:  Negative for arthralgias.  Skin:  Negative for itching and rash.  Neurological:  Negative for dizziness, extremity weakness, headaches and numbness.  Hematological:  Negative for adenopathy. Does not bruise/bleed easily.  Psychiatric/Behavioral:  Negative for depression. The patient is not nervous/anxious.    Breast: Denies any new nodularity, masses, tenderness, nipple changes, or nipple discharge.     PAST MEDICAL/SURGICAL HISTORY:  Past Medical History:  Diagnosis Date   Anemia    right breast ca dx'd 06/2019   chemo   Varicose vein    Varicose veins of left lower extremity    "bluging ones at top- no pain"   Past Surgical History:  Procedure Laterality Date   MASTECTOMY MODIFIED RADICAL Right 02/17/2020   Procedure: RIGHT MASTECTOMY MODIFIED RADICAL;  Surgeon: [name], MD;  Location: [facility];  Service: General;  Laterality: Right;   NO PAST SURGERIES     none     PORT-A-CATH REMOVAL Right 02/17/2020   Procedure: REMOVAL PORT-A-CATH;  Surgeon: [name], MD;  Location: [facility];  Service: General;  Laterality: Right;   PORTACATH PLACEMENT Right 08/17/2019    Procedure: INSERTION PORT-A-CATH WITH ULTRASOUND;  Surgeon: [name], MD;  Location: [facility];  Service: General;  Laterality: Right;   Gilbert teeth removal     remotely.      ALLERGIES:  No Known Allergies   CURRENT MEDICATIONS:  Outpatient Encounter Medications as of 01/07/2022  Medication Sig   Calcium Citrate-Vitamin D (CALCIUM CITRATE + D PO) Take 1 tablet by mouth once. 1 daily   tamoxifen (NOLVADEX) 20 MG tablet Take 1 tablet (20 mg total) by mouth daily.   Calcium Citrate-Vitamin D3 315-6.25 MG-MCG TABS Take by mouth. (Patient not taking: Reported on 01/07/2022)   cholecalciferol (VITAMIN D3) 25 MCG (1000 UNIT) tablet Take 1 tablet (1,000 Units total) by mouth daily. (Patient not taking: Reported on 01/07/2022)   [DISCONTINUED] prochlorperazine (COMPAZINE) 10 MG tablet Take 1 tablet (10 mg total) by mouth every 6 (six) hours as needed (Nausea or vomiting).   No facility-administered encounter medications on file as of 01/07/2022.     ONCOLOGIC FAMILY HISTORY:  Family History  Problem Relation Age of Onset   Diabetes Father    Blindness Mother    Congestive Heart Failure Sister    Gallbladder disease Sister  removed   Diabetes Sister    Colitis Sister      GENETIC COUNSELING/TESTING: Not at this time  SOCIAL HISTORY:  Social History   Socioeconomic History   Marital status: Married    Spouse name: [name]   Number of children: 5   Years of education: 16   Highest education level: Not on file  Occupational History   Not on file  Tobacco Use   Smoking status: Never   Smokeless tobacco: Never  Vaping Use   Vaping Use: Never used  Substance and Sexual Activity   Alcohol use: Yes    Comment: rarely   Drug use: No   Sexual activity: Not Currently  Other Topics Concern   Not on file  Social History Narrative   Patient is married [name]).   Patient has 5 children.   Patient works full-time.   Patient drinks one cup of coffee daily.    Patient has a college education (BS).   Patient is right-handed.   Social Determinants of Health   Financial Resource Strain: Not on file  Food Insecurity: Not on file  Transportation Needs: Not on file  Physical Activity: Not on file  Stress: Not on file  Social Connections: Not on file  Intimate Partner Violence: Not on file     OBSERVATIONS/OBJECTIVE:  BP 119/71   Pulse 69   Temp (!) 97.3 F (36.3 C) (Oral)   Resp 18   Ht _0  (1.626 m)   Wt 142 lb 9.6 oz (64.7 kg)   SpO2 100%   BMI 24.48 kg/m  GENERAL: Patient is a well appearing female in no acute distress HEENT:  Sclerae anicteric.  Oropharynx clear and moist. No ulcerations or evidence of oropharyngeal candidiasis. Neck is supple.  NODES:  No cervical, supraclavicular, or axillary lymphadenopathy palpated.  BREAST EXAM:  Right breast s/p mastectomy, no sign of local recurrence, left breast benign.  LUNGS:  Clear to auscultation bilaterally.  No wheezes or rhonchi. HEART:  Regular rate and rhythm. No murmur appreciated. ABDOMEN:  Soft, nontender.  Positive, normoactive bowel sounds. No organomegaly palpated. MSK:  No focal spinal tenderness to palpation. Full range of motion bilaterally in the upper extremities. EXTREMITIES:  No peripheral edema.   SKIN:  Clear with no obvious rashes or skin changes. No nail dyscrasia. NEURO:  Nonfocal. Well oriented.  Appropriate affect.  LABORATORY DATA:  None for this visit.  DIAGNOSTIC IMAGING:  None for this visit.      ASSESSMENT AND PLAN:  Ms.. Gilbert is a pleasant 66 y.o. 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  She presents to the Survivorship [facility] for our initial meeting and routine follow-up post-completion of treatment for breast cancer.    1. Stage  IIIB right breast cancer:  Paige Gilbert is continuing to recover from definitive treatment for breast cancer. She will follow-up with me in one years time with history and physical exam per surveillance protocol.  She will continue her anti-estrogen therapy with tamoxifen. Thus far, she is tolerating the tamoxifen well, with minimal side effects.  We discussed potentially taking tamoxifen for 10 years considering the stage of her breast cancer at diagnosis and surgery.  Her mammogram is due and she is going to call [facility] to get this scheduled. Today, a comprehensive survivorship care plan and treatment summary was reviewed with the patient today  detailing her breast cancer diagnosis, treatment course, potential late/long-term effects of treatment, appropriate follow-up care with recommendations for the future, and patient education resources.  A copy of this summary, along with a letter will be sent to the patient's primary care provider via mail/fax/In Basket message after today's visit.    2. Bone health:   She was given education on specific activities to promote bone health.  3. Cancer screening:  Due to Paige Gilbert's history and her age, she should receive screening for skin cancers, colon cancer, and gynecologic cancers.  The information and recommendations are listed on the patient's comprehensive care plan/treatment summary and were reviewed in detail with the patient.    4. Health maintenance and wellness promotion: Ms. Pistole was encouraged to consume 5-7 servings of fruits and vegetables per day. We reviewed the "Nutrition Rainbow" handout.  She was also encouraged to engage in moderate to vigorous exercise for 30 minutes per day most days of the week. We discussed the [employer] fitness program, which is designed for cancer survivors to help them become more physically fit after cancer treatments.  She was instructed to limit her alcohol consumption and continue to abstain from tobacco use.      5. Support services/counseling: It is not uncommon for this period of the patient's cancer care trajectory to be one of many emotions and stressors.  She was given information regarding our available services and encouraged to contact me with any questions or for help enrolling in any of our support group/programs.    Follow up instructions:    -Return to cancer center in 1 year -Mammogram due   The patient was provided an opportunity to ask questions and all were answered. The patient agreed with the plan and demonstrated an understanding of the instructions.   Total encounter time:45 minutes*in face-to-face visit time, chart review, lab review, care coordination, order entry, and documentation of the encounter time.    Wilber Bihari, NP 01/07/22 2:46 PM Medical Oncology and Hematology [facility] [place], [place] [address] Tel. [phone]    Fax. [phone]  *Total Encounter Time as defined by the Centers for Medicare and Medicaid Services includes, in addition to the face-to-face time of a patient visit (documented in the note above) non-face-to-face time: obtaining and reviewing outside history, ordering and reviewing medications, tests or procedures, care coordination (communications with other health care professionals or caregivers) and documentation in the medical record.

## 2022-01-08 ENCOUNTER — Telehealth: Payer: Self-pay | Admitting: Adult Health

## 2022-01-08 NOTE — Telephone Encounter (Signed)
Scheduled appointment per 6/12 los. Patient is aware.

## 2022-06-10 ENCOUNTER — Other Ambulatory Visit: Payer: Self-pay | Admitting: *Deleted

## 2022-06-10 MED ORDER — TAMOXIFEN CITRATE 20 MG PO TABS: 20.0000 mg | ORAL_TABLET | Freq: Every day | ORAL | 4 refills | Status: DC

## 2023-01-13 ENCOUNTER — Encounter: Payer: Self-pay | Admitting: Adult Health

## 2023-01-13 ENCOUNTER — Inpatient Hospital Stay: Payer: Medicare Other | Attending: Adult Health | Admitting: Adult Health

## 2023-01-13 ENCOUNTER — Other Ambulatory Visit: Payer: Self-pay

## 2023-01-13 VITALS — BP 110/49 | HR 86 | Temp 97.7°F | Resp 18 | Ht 64.0 in | Wt 143.0 lb

## 2023-01-13 DIAGNOSIS — Z79810 Long term (current) use of selective estrogen receptor modulators (SERMs): Secondary | ICD-10-CM | POA: Insufficient documentation

## 2023-01-13 DIAGNOSIS — Z170 Estrogen receptor positive status [ER+]: Secondary | ICD-10-CM | POA: Diagnosis not present

## 2023-01-13 DIAGNOSIS — C50411 Malignant neoplasm of upper-outer quadrant of right female breast: Secondary | ICD-10-CM | POA: Diagnosis not present

## 2023-01-13 NOTE — Progress Notes (Signed)
[facility] Cancer Follow up:    Paige Manson, NP [address] [place] [address]   DIAGNOSIS:  Cancer Staging  Malignant neoplasm of upper-outer quadrant of right breast in female, estrogen receptor positive (HCC) Staging form: Breast, AJCC 8th Edition - Clinical: Stage IIIB (cT4, cN2, cM0, G2, ER+, PR-, HER2-) - Unsigned Stage prefix: Initial diagnosis Histologic grading system: 3 grade system - Pathologic stage from 02/17/2020: No Stage Recommended (ypT3, pN2a, cM0, G2, ER+, PR+, HER2+) - Signed by Loa Socks, NP on 03/01/2020 Stage prefix: Post-therapy Histologic grading system: 3 grade system   SUMMARY OF ONCOLOGIC HISTORY: Oncology History  Malignant neoplasm of upper-outer quadrant of right breast in female, estrogen receptor positive (HCC)  08/03/2019 Initial Diagnosis   Malignant neoplasm of upper-outer quadrant of right breast in female, estrogen receptor positive (HCC)   08/18/2019 - 01/11/2020 Chemotherapy   The patient had dexamethasone (DECADRON) 4 MG tablet, 1 of 1 cycle, Start date: 08/12/2019, End date: 10/12/2019 DOXOrubicin (ADRIAMYCIN) chemo injection 102 mg, 60 mg/m2 = 102 mg, Intravenous,  Once, 4 of 4 cycles Administration: 102 mg (08/18/2019), 102 mg (08/31/2019), 102 mg (09/14/2019), 102 mg (09/28/2019) palonosetron (ALOXI) injection 0.25 mg, 0.25 mg, Intravenous,  Once, 4 of 4 cycles Administration: 0.25 mg (08/18/2019), 0.25 mg (08/31/2019), 0.25 mg (09/14/2019), 0.25 mg (09/28/2019) pegfilgrastim-jmdb (FULPHILA) injection 6 mg, 6 mg, Subcutaneous,  Once, 1 of 1 cycle cyclophosphamide (CYTOXAN) 1,020 mg in sodium chloride 0.9 % 250 mL chemo infusion, 600 mg/m2 = 1,020 mg, Intravenous,  Once, 4 of 4 cycles Administration: 1,020 mg (08/18/2019), 1,020 mg (08/31/2019), 1,020 mg (09/14/2019), 1,020 mg (09/28/2019) PACLitaxel (TAXOL) 138 mg in sodium chloride 0.9 % 250 mL chemo infusion (</= 80mg /m2), 80 mg/m2 = 138 mg, Intravenous,  Once, 12 of  12 cycles Administration: 138 mg (10/12/2019), 138 mg (10/26/2019), 138 mg (11/02/2019), 138 mg (11/16/2019), 138 mg (11/23/2019), 138 mg (11/30/2019), 138 mg (12/07/2019), 138 mg (12/14/2019), 138 mg (12/21/2019), 138 mg (12/28/2019), 138 mg (01/05/2020), 138 mg (01/11/2020) fosaprepitant (EMEND) 150 mg in sodium chloride 0.9 % 145 mL IVPB, 150 mg, Intravenous,  Once, 4 of 4 cycles Administration: 150 mg (08/18/2019), 150 mg (08/31/2019), 150 mg (09/14/2019), 150 mg (09/28/2019)  for chemotherapy treatment.    02/17/2020 Cancer Staging   Staging form: Breast, AJCC 8th Edition - Pathologic stage from 02/17/2020: No Stage Recommended (ypT3, pN2a, cM0, G2, ER+, PR+, HER2+) - Signed by Loa Socks, NP on 03/01/2020   02/17/2020 Surgery   BREAST, RIGHT, MODIFIED RADICAL MASTECTOMY:  - Residual invasive and in situ ductal carcinoma status post neoadjuvant  treatment.  - Margins of resection are not involved (Closest margin: < 1 mm, deep).  - Metastatic carcinoma in (7) of (9) lymph nodes.    03/15/2020 - 03/07/2021 Chemotherapy      Patient is on Antibody Plan: BREAST TRASTUZUMAB Q21D     04/04/2020 - 05/15/2020 Radiation Therapy   Site Technique Total Dose (Gy) Dose per Fx (Gy) Completed Fx Beam Energies  Chest Wall, Right: CW_Rt_IM_LN 3D 50/50 2 25/25 6X, 10X  Sclav-RT: SCV_Rt_PAB 3D 50/50 2 25/25 6X, 10X  Chest Wall, Right: CW_Rt_Bst Electron 10/10 2 5/5 6E     06/25/2020 -  Anti-estrogen oral therapy   Tamoxifen to be continued for a minimum of 5 years     CURRENT THERAPY: Tamoxifen  INTERVAL HISTORY: Paige Gilbert 67 y.o. female returns for follow-up of her history of triple positive breast cancer.  She continues  on tamoxifen daily.  She is tolerating this well.  She denies any vaginal bleeding.  She continues to see her primary care provider Paige Gilbert annually.  She most recently saw her in February of this year where she had her annual wellness visit.  She is overdue for her mammogram  but denies any breast changes or concerns today.  She continues to work by cleaning houses and remains active and exercises 5 times a week.  She tells me she enjoys fruits and vegetables in eating a well-balanced diet.   Patient Active Problem List   Diagnosis Date Noted   Osteoporosis 08/30/2020   Malignant neoplasm of upper-outer quadrant of right breast in female, estrogen receptor positive (HCC) 08/03/2019   Plantar fasciitis, bilateral 02/15/2015   Lumbago 02/15/2015   Screening 02/15/2015   Hemisensory loss 06/11/2013   Weakness 06/11/2013    has No Known Allergies.  MEDICAL HISTORY: Past Medical History:  Diagnosis Date   Anemia    right breast ca dx'd 06/2019   chemo   Varicose vein    Varicose veins of left lower extremity    "bluging ones at top- no pain"    SURGICAL HISTORY: Past Surgical History:  Procedure Laterality Date   MASTECTOMY MODIFIED RADICAL Right 02/17/2020   Procedure: RIGHT MASTECTOMY MODIFIED RADICAL;  Surgeon: Harriette Bouillon, MD;  Location: [facility] OR;  Service: General;  Laterality: Right;   NO PAST SURGERIES     none     PORT-A-CATH REMOVAL Right 02/17/2020   Procedure: REMOVAL PORT-A-CATH;  Surgeon: Harriette Bouillon, MD;  Location: [facility] OR;  Service: General;  Laterality: Right;   PORTACATH PLACEMENT Right 08/17/2019   Procedure: INSERTION PORT-A-CATH WITH ULTRASOUND;  Surgeon: Harriette Bouillon, MD;  Location: [facility] OR;  Service: General;  Laterality: Right;   wisdom teeth removal     remotely.     SOCIAL HISTORY: Social History   Socioeconomic History   Marital status: Married    Spouse name: Paige Gilbert   Number of children: 5   Years of education: 16   Highest education level: Not on file  Occupational History   Not on file  Tobacco Use   Smoking status: Never   Smokeless tobacco: Never  Vaping Use   Vaping Use: Never used  Substance and Sexual Activity   Alcohol use: Yes    Comment: rarely   Drug use: No   Sexual activity: Not Currently   Other Topics Concern   Not on file  Social History Narrative   Patient is married Paige Gilbert).   Patient has 5 children.   Patient works full-time.   Patient drinks one cup of coffee daily.   Patient has a college education (BS).   Patient is right-handed.   Social Determinants of Health   Financial Resource Strain: Not on file  Food Insecurity: Not on file  Transportation Needs: Not on file  Physical Activity: Not on file  Stress: Not on file  Social Connections: Not on file  Intimate Partner Violence: Not on file    FAMILY HISTORY: Family History  Problem Relation Age of Onset   Diabetes Father    Blindness Mother    Congestive Heart Failure Sister    Gallbladder disease Sister        removed   Diabetes Sister    Colitis Sister     Review of Systems  Constitutional:  Negative for appetite change, chills, fatigue, fever and unexpected weight change.  HENT:   Negative for hearing loss, lump/mass  and trouble swallowing.   Eyes:  Negative for eye problems and icterus.  Respiratory:  Negative for chest tightness, cough and shortness of breath.   Cardiovascular:  Negative for chest pain, leg swelling and palpitations.  Gastrointestinal:  Negative for abdominal distention, abdominal pain, constipation, diarrhea, nausea and vomiting.  Endocrine: Negative for hot flashes.  Genitourinary:  Negative for difficulty urinating.   Musculoskeletal:  Negative for arthralgias.  Skin:  Negative for itching and rash.  Neurological:  Negative for dizziness, extremity weakness, headaches and numbness.  Hematological:  Negative for adenopathy. Does not bruise/bleed easily.  Psychiatric/Behavioral:  Negative for depression. The patient is not nervous/anxious.       PHYSICAL EXAMINATION    Vitals:   01/13/23 1001  BP: (!) 110/49  Pulse: 86  Resp: 18  Temp: 97.7 F (36.5 C)  SpO2: 100%    Physical Exam Constitutional:      General: She is not in acute distress.     Appearance: Normal appearance. She is not toxic-appearing.  HENT:     Head: Normocephalic and atraumatic.     Mouth/Throat:     Mouth: Mucous membranes are moist.     Pharynx: Oropharynx is clear. No oropharyngeal exudate or posterior oropharyngeal erythema.  Eyes:     General: No scleral icterus. Cardiovascular:     Rate and Rhythm: Normal rate and regular rhythm.     Pulses: Normal pulses.     Heart sounds: Normal heart sounds.  Pulmonary:     Effort: Pulmonary effort is normal.     Breath sounds: Normal breath sounds.  Chest:     Comments: Right chest wall s/p mastectomy and radiation, no sign of local recurrence, left breast benign Abdominal:     General: Abdomen is flat. Bowel sounds are normal. There is no distension.     Palpations: Abdomen is soft.     Tenderness: There is no abdominal tenderness.  Musculoskeletal:        General: No swelling.     Cervical back: Neck supple.  Lymphadenopathy:     Cervical: No cervical adenopathy.  Skin:    General: Skin is warm and dry.     Findings: No rash.  Neurological:     General: No focal deficit present.     Mental Status: She is alert.  Psychiatric:        Mood and Affect: Mood normal.        Behavior: Behavior normal.     LABORATORY DATA: Reviewed in care everywhere from February 2024.   ASSESSMENT and THERAPY PLAN:   Malignant neoplasm of upper-outer quadrant of right breast in female, estrogen receptor positive (HCC) Paige Gilbert is a 67 year old woman with h/o triple positive right breast invasive ductal carcinoma diagnosed in January 2021 status post neoadjuvant chemotherapy, right mastectomy, maintenance trastuzumab for 1 year, adjuvant radiation therapy, and antiestrogen therapy with tamoxifen that began in November 2021.  Stage IIIb right breast triple positive invasive ductal carcinoma: She has no clinical signs of breast cancer recurrence today.  She is overdue for her left breast screening mammogram which I  recommended that she go ahead and get scheduled.  She continues on tamoxifen daily with good tolerance. Health maintenance: She is overdue for her Pap smear.  I recommended that she follow-up with her PCP about getting this scheduled.  Her colon cancer screening is up-to-date. Diet and exercise: I recommended that she continue with her healthy diet and activity level.  Joscelyne was recommended to  return in 1 year for continued long-term follow-up.    All questions were answered. The patient knows to call the clinic with any problems, questions or concerns. We can certainly see the patient much sooner if necessary.  Total encounter time:30 minutes*in face-to-face visit time, chart review, lab review, care coordination, order entry, and documentation of the encounter time.    Lillard Anes, NP 01/13/23 10:54 AM Medical Oncology and Hematology [facility] [address] [place], [place] [address] Tel. [phone]    Fax. [phone]  *Total Encounter Time as defined by the Centers for Medicare and Medicaid Services includes, in addition to the face-to-face time of a patient visit (documented in the note above) non-face-to-face time: obtaining and reviewing outside history, ordering and reviewing medications, tests or procedures, care coordination (communications with other health care professionals or caregivers) and documentation in the medical record.

## 2023-01-13 NOTE — Assessment & Plan Note (Signed)
Paige Gilbert is a 67 year old woman with h/o triple positive right breast invasive ductal carcinoma diagnosed in January 2021 status post neoadjuvant chemotherapy, right mastectomy, maintenance trastuzumab for 1 year, adjuvant radiation therapy, and antiestrogen therapy with tamoxifen that began in November 2021.  Stage IIIb right breast triple positive invasive ductal carcinoma: She has no clinical signs of breast cancer recurrence today.  She is overdue for her left breast screening mammogram which I recommended that she go ahead and get scheduled.  She continues on tamoxifen daily with good tolerance. Health maintenance: She is overdue for her Pap smear.  I recommended that she follow-up with her PCP about getting this scheduled.  Her colon cancer screening is up-to-date. Diet and exercise: I recommended that she continue with her healthy diet and activity level.  Exer was recommended to return in 1 year for continued long-term follow-up.

## 2023-06-18 NOTE — Telephone Encounter (Signed)
Telephone call  

## 2023-07-10 ENCOUNTER — Telehealth: Payer: Self-pay | Admitting: Adult Health

## 2023-08-31 ENCOUNTER — Other Ambulatory Visit: Payer: Self-pay | Admitting: Adult Health

## 2023-11-03 ENCOUNTER — Other Ambulatory Visit: Payer: Self-pay | Admitting: *Deleted

## 2023-11-03 DIAGNOSIS — C50411 Malignant neoplasm of upper-outer quadrant of right female breast: Secondary | ICD-10-CM

## 2023-11-24 ENCOUNTER — Encounter: Payer: Self-pay | Admitting: Adult Health

## 2024-01-12 ENCOUNTER — Encounter: Payer: Medicare Other | Admitting: Adult Health

## 2024-03-08 ENCOUNTER — Encounter: Payer: Self-pay | Admitting: Adult Health

## 2024-03-08 ENCOUNTER — Inpatient Hospital Stay

## 2024-03-08 ENCOUNTER — Inpatient Hospital Stay: Attending: Adult Health | Admitting: Adult Health

## 2024-03-08 ENCOUNTER — Other Ambulatory Visit: Payer: Self-pay | Admitting: *Deleted

## 2024-03-08 VITALS — BP 125/54 | HR 70 | Temp 98.2°F | Resp 18 | Wt 143.7 lb

## 2024-03-08 DIAGNOSIS — Z8249 Family history of ischemic heart disease and other diseases of the circulatory system: Secondary | ICD-10-CM | POA: Insufficient documentation

## 2024-03-08 DIAGNOSIS — Z9221 Personal history of antineoplastic chemotherapy: Secondary | ICD-10-CM | POA: Insufficient documentation

## 2024-03-08 DIAGNOSIS — Z79899 Other long term (current) drug therapy: Secondary | ICD-10-CM | POA: Diagnosis not present

## 2024-03-08 DIAGNOSIS — Z170 Estrogen receptor positive status [ER+]: Secondary | ICD-10-CM | POA: Insufficient documentation

## 2024-03-08 DIAGNOSIS — C50411 Malignant neoplasm of upper-outer quadrant of right female breast: Secondary | ICD-10-CM | POA: Diagnosis not present

## 2024-03-08 DIAGNOSIS — Z8379 Family history of other diseases of the digestive system: Secondary | ICD-10-CM | POA: Diagnosis not present

## 2024-03-08 DIAGNOSIS — Z79810 Long term (current) use of selective estrogen receptor modulators (SERMs): Secondary | ICD-10-CM | POA: Diagnosis not present

## 2024-03-08 DIAGNOSIS — M7989 Other specified soft tissue disorders: Secondary | ICD-10-CM | POA: Diagnosis not present

## 2024-03-08 DIAGNOSIS — Z833 Family history of diabetes mellitus: Secondary | ICD-10-CM | POA: Diagnosis not present

## 2024-03-08 DIAGNOSIS — Z1732 Human epidermal growth factor receptor 2 negative status: Secondary | ICD-10-CM | POA: Diagnosis not present

## 2024-03-08 DIAGNOSIS — I890 Lymphedema, not elsewhere classified: Secondary | ICD-10-CM | POA: Diagnosis not present

## 2024-03-08 DIAGNOSIS — M25569 Pain in unspecified knee: Secondary | ICD-10-CM | POA: Diagnosis not present

## 2024-03-08 DIAGNOSIS — Z1722 Progesterone receptor negative status: Secondary | ICD-10-CM | POA: Diagnosis not present

## 2024-03-08 DIAGNOSIS — Z821 Family history of blindness and visual loss: Secondary | ICD-10-CM | POA: Diagnosis not present

## 2024-03-08 LAB — CBC WITH DIFFERENTIAL (CANCER CENTER ONLY)
Abs Immature Granulocytes: 0.01 K/uL (ref 0.00–0.07)
Basophils Absolute: 0.1 K/uL (ref 0.0–0.1)
Basophils Relative: 1 %
Eosinophils Absolute: 0.1 K/uL (ref 0.0–0.5)
Eosinophils Relative: 1 %
HCT: 39.6 % (ref 36.0–46.0)
Hemoglobin: 13.3 g/dL (ref 12.0–15.0)
Immature Granulocytes: 0 %
Lymphocytes Relative: 30 %
Lymphs Abs: 1.8 K/uL (ref 0.7–4.0)
MCH: 28.2 pg (ref 26.0–34.0)
MCHC: 33.6 g/dL (ref 30.0–36.0)
MCV: 83.9 fL (ref 80.0–100.0)
Monocytes Absolute: 0.3 K/uL (ref 0.1–1.0)
Monocytes Relative: 5 %
Neutro Abs: 3.7 K/uL (ref 1.7–7.7)
Neutrophils Relative %: 63 %
Platelet Count: 148 K/uL — ABNORMAL LOW (ref 150–400)
RBC: 4.72 MIL/uL (ref 3.87–5.11)
RDW: 12.5 % (ref 11.5–15.5)
WBC Count: 5.9 K/uL (ref 4.0–10.5)
nRBC: 0 % (ref 0.0–0.2)

## 2024-03-08 LAB — CMP (CANCER CENTER ONLY)
ALT: 15 U/L (ref 0–44)
AST: 15 U/L (ref 15–41)
Albumin: 4.1 g/dL (ref 3.5–5.0)
Alkaline Phosphatase: 48 U/L (ref 38–126)
Anion gap: 4 — ABNORMAL LOW (ref 5–15)
BUN: 17 mg/dL (ref 8–23)
CO2: 28 mmol/L (ref 22–32)
Calcium: 9.1 mg/dL (ref 8.9–10.3)
Chloride: 106 mmol/L (ref 98–111)
Creatinine: 0.7 mg/dL (ref 0.44–1.00)
GFR, Estimated: >60 mL/min (ref >60)
Glucose, Bld: 100 mg/dL — ABNORMAL HIGH (ref 70–99)
Potassium: 4.3 mmol/L (ref 3.5–5.1)
Sodium: 138 mmol/L (ref 135–145)
Total Bilirubin: 0.3 mg/dL (ref 0.0–1.2)
Total Protein: 6.7 g/dL (ref 6.5–8.1)

## 2024-03-08 NOTE — Progress Notes (Signed)
 [facility] Cancer Follow up:    Teresa Aldona CROME, NP [address] [place] [address]   DIAGNOSIS:  Cancer Staging  Malignant neoplasm of upper-outer quadrant of right breast in female, estrogen receptor positive (HCC) Staging form: Breast, AJCC 8th Edition - Clinical: Stage IIIB (cT4, cN2, cM0, G2, ER+, PR-, HER2-) - Unsigned Stage prefix: Initial diagnosis Histologic grading system: 3 grade system - Pathologic stage from 02/17/2020: ypT3, ypN2a, cM0, G2, ER+, PR+, HER2+ - Signed by Crawford Morna Pickle, NP on 03/01/2020 Stage prefix: Post-therapy Histologic grading system: 3 grade system    SUMMARY OF ONCOLOGIC HISTORY: Oncology History  Malignant neoplasm of upper-outer quadrant of right breast in female, estrogen receptor positive (HCC)  08/03/2019 Initial Diagnosis   Malignant neoplasm of upper-outer quadrant of right breast in female, estrogen receptor positive (HCC)   08/18/2019 - 01/11/2020 Chemotherapy   The patient had dexamethasone  (DECADRON ) 4 MG tablet, 1 of 1 cycle, Start date: 08/12/2019, End date: 10/12/2019 DOXOrubicin  (ADRIAMYCIN ) chemo injection 102 mg, 60 mg/m2 = 102 mg, Intravenous,  Once, 4 of 4 cycles Administration: 102 mg (08/18/2019), 102 mg (08/31/2019), 102 mg (09/14/2019), 102 mg (09/28/2019) palonosetron  (ALOXI ) injection 0.25 mg, 0.25 mg, Intravenous,  Once, 4 of 4 cycles Administration: 0.25 mg (08/18/2019), 0.25 mg (08/31/2019), 0.25 mg (09/14/2019), 0.25 mg (09/28/2019) pegfilgrastim -jmdb (FULPHILA ) injection 6 mg, 6 mg, Subcutaneous,  Once, 1 of 1 cycle cyclophosphamide  (CYTOXAN ) 1,020 mg in sodium chloride  0.9 % 250 mL chemo infusion, 600 mg/m2 = 1,020 mg, Intravenous,  Once, 4 of 4 cycles Administration: 1,020 mg (08/18/2019), 1,020 mg (08/31/2019), 1,020 mg (09/14/2019), 1,020 mg (09/28/2019) PACLitaxel  (TAXOL ) 138 mg in sodium chloride  0.9 % 250 mL chemo infusion (</= 80mg /m2), 80 mg/m2 = 138 mg, Intravenous,  Once, 12 of 12  cycles Administration: 138 mg (10/12/2019), 138 mg (10/26/2019), 138 mg (11/02/2019), 138 mg (11/16/2019), 138 mg (11/23/2019), 138 mg (11/30/2019), 138 mg (12/07/2019), 138 mg (12/14/2019), 138 mg (12/21/2019), 138 mg (12/28/2019), 138 mg (01/05/2020), 138 mg (01/11/2020) fosaprepitant  (EMEND) 150 mg in sodium chloride  0.9 % 145 mL IVPB, 150 mg, Intravenous,  Once, 4 of 4 cycles Administration: 150 mg (08/18/2019), 150 mg (08/31/2019), 150 mg (09/14/2019), 150 mg (09/28/2019)  for chemotherapy treatment.    02/17/2020 Cancer Staging   Staging form: Breast, AJCC 8th Edition - Pathologic stage from 02/17/2020: No Stage Recommended (ypT3, pN2a, cM0, G2, ER+, PR+, HER2+) - Signed by Crawford Morna Pickle, NP on 03/01/2020   02/17/2020 Surgery   BREAST, RIGHT, MODIFIED RADICAL MASTECTOMY:  - Residual invasive and in situ ductal carcinoma status post neoadjuvant  treatment.  - Margins of resection are not involved (Closest margin: < 1 mm, deep).  - Metastatic carcinoma in (7) of (9) lymph nodes.    03/15/2020 - 03/07/2021 Chemotherapy      Patient is on Antibody Plan: BREAST TRASTUZUMAB  Q21D     04/04/2020 - 05/15/2020 Radiation Therapy   Site Technique Total Dose (Gy) Dose per Fx (Gy) Completed Fx Beam Energies  Chest Wall, Right: CW_Rt_IM_LN 3D 50/50 2 25/25 6X, 10X  Sclav-RT: SCV_Rt_PAB 3D 50/50 2 25/25 6X, 10X  Chest Wall, Right: CW_Rt_Bst Electron 10/10 2 5/5 6E     06/25/2020 -  Anti-estrogen oral therapy   Tamoxifen  to be continued for a minimum of 5 years     CURRENT THERAPY: Tamoxifen   INTERVAL HISTORY:  Discussed the use of AI scribe software for clinical note transcription with the patient, who gave verbal consent to proceed.  History of Present Illness Paige Gilbert is a 68 year old female with stage 3B estrogen positive and HER2 positive breast cancer who presents for follow-up and discussion of new symptoms.  She has a history of stage 3B estrogen positive breast cancer diagnosed in  2021, treated with chemotherapy, right mastectomy, Herceptin , adjuvant radiation, and is currently on tamoxifen . Recent left breast screening mammogram on November 17, 2023, shows no evidence of malignancy.  Over the past month, she experiences puffiness and tightness in her hand. She has a history of lymphedema in the arm, which has flared up but responded to exercises. She is uncertain if the hand symptoms are related to lymphedema or arthritis.  She reports a new knee issue with transient pain and a fluctuating 'bubble' in front of the knee, but no current pain or mobility issues.  She experiences discomfort in her torso upon waking at night, painful initially but without bowel or bladder issues.  She expresses concerns about tamoxifen  side effects, including sun sensitivity and reactions to bug bites, resulting in welts and itchiness. She feels 'sun sick' after sun exposure but recovers in a few days. No cough, shortness of breath, chest pain, or palpitations.     Patient Active Problem List   Diagnosis Date Noted   Osteoporosis 08/30/2020   Malignant neoplasm of upper-outer quadrant of right breast in female, estrogen receptor positive (HCC) 08/03/2019   Plantar fasciitis, bilateral 02/15/2015   Lumbago 02/15/2015   Encounter for health-related screening 02/15/2015   Hemisensory loss 06/11/2013   Weakness 06/11/2013    has no known allergies.  MEDICAL HISTORY: Past Medical History:  Diagnosis Date   Anemia    right breast ca dx'd 06/2019   chemo   Varicose vein    Varicose veins of left lower extremity    bluging ones at top- no pain    SURGICAL HISTORY: Past Surgical History:  Procedure Laterality Date   MASTECTOMY MODIFIED RADICAL Right 02/17/2020   Procedure: RIGHT MASTECTOMY MODIFIED RADICAL;  Surgeon: Vanderbilt Ned, MD;  Location: [facility] OR;  Service: General;  Laterality: Right;   NO PAST SURGERIES     none     PORT-A-CATH REMOVAL Right 02/17/2020   Procedure:  REMOVAL PORT-A-CATH;  Surgeon: Vanderbilt Ned, MD;  Location: [facility] OR;  Service: General;  Laterality: Right;   PORTACATH PLACEMENT Right 08/17/2019   Procedure: INSERTION PORT-A-CATH WITH ULTRASOUND;  Surgeon: Vanderbilt Ned, MD;  Location: [facility] OR;  Service: General;  Laterality: Right;   wisdom teeth removal     remotely.     SOCIAL HISTORY: Social History   Socioeconomic History   Marital status: Married    Spouse name: Garnette   Number of children: 5   Years of education: 16   Highest education level: Not on file  Occupational History   Not on file  Tobacco Use   Smoking status: Never   Smokeless tobacco: Never  Vaping Use   Vaping status: Never Used  Substance and Sexual Activity   Alcohol use: Yes    Comment: rarely   Drug use: No   Sexual activity: Not Currently  Other Topics Concern   Not on file  Social History Narrative   Patient is married Wadie).   Patient has 5 children.   Patient works full-time.   Patient drinks one cup of coffee daily.   Patient has a college education (BS).   Patient is right-handed.   Social Drivers of Corporate investment banker Strain: Low Risk  (  09/13/2022)   Received from [facility]   Overall Financial Resource Strain (CARDIA)    Difficulty of Paying Living Expenses: Not hard at all  Food Insecurity: No Food Insecurity (09/13/2022)   [facility] from [facility]   Hunger Vital Sign    Within the past 12 months, you worried that your food would run out before you got the money to buy more.: Never true    Within the past 12 months, the food you bought just didn't last and you didn't have money to get more.: Never true  Transportation Needs: No Transportation Needs (09/13/2022)   Received from [facility] - Transportation    Lack of Transportation (Medical): No    Lack of Transportation (Non-Medical): No  Physical Activity: Not on file  Stress: Not on file  Social Connections: Unknown (12/10/2021)   Received from  [facility]   Social Network    Social Network: Not on file  Intimate Partner Violence: Unknown (11/01/2021)   Received from [facility]   HITS    Physically Hurt: Not on file    Insult or Talk Down To: Not on file    Threaten Physical Harm: Not on file    Scream or Curse: Not on file    FAMILY HISTORY: Family History  Problem Relation Age of Onset   Diabetes Father    Blindness Mother    Congestive Heart Failure Sister    Gallbladder disease Sister        removed   Diabetes Sister    Colitis Sister     Review of Systems  Constitutional:  Negative for appetite change, chills, fatigue, fever and unexpected weight change.  HENT:   Negative for hearing loss, lump/mass and trouble swallowing.   Eyes:  Negative for eye problems and icterus.  Respiratory:  Negative for chest tightness, cough and shortness of breath.   Cardiovascular:  Negative for chest pain, leg swelling and palpitations.  Gastrointestinal:  Negative for abdominal distention, abdominal pain, constipation, diarrhea, nausea and vomiting.  Endocrine: Negative for hot flashes.  Genitourinary:  Negative for difficulty urinating.   Musculoskeletal:  Negative for arthralgias.  Skin:  Negative for itching and rash.  Neurological:  Negative for dizziness, extremity weakness, headaches and numbness.  Hematological:  Negative for adenopathy. Does not bruise/bleed easily.  Psychiatric/Behavioral:  Negative for depression. The patient is not nervous/anxious.       PHYSICAL EXAMINATION   Onc Performance Status - 03/08/24 1141       ECOG Perf Status   ECOG Perf Status Restricted in physically strenuous activity but ambulatory and able to carry out work of a light or sedentary nature, e.g., light house work, office work      KPS SCALE   KPS % SCORE Able to carry on normal activity, minor s/s of disease          Vitals:   03/08/24 1145  BP: (!) 125/54  Pulse: 70  Resp: 18  Temp: 98.2 F (36.8 C)  SpO2: 100%     Physical Exam Constitutional:      General: She is not in acute distress.    Appearance: Normal appearance. She is not toxic-appearing.  HENT:     Head: Normocephalic and atraumatic.     Mouth/Throat:     Mouth: Mucous membranes are moist.     Pharynx: Oropharynx is clear. No oropharyngeal exudate or posterior oropharyngeal erythema.  Eyes:     General: No scleral icterus. Cardiovascular:  Rate and Rhythm: Normal rate and regular rhythm.     Pulses: Normal pulses.     Heart sounds: Normal heart sounds.  Pulmonary:     Effort: Pulmonary effort is normal.     Breath sounds: Normal breath sounds.  Chest:     Comments: Right breast s/p mastectomy and radiation, no sign of recurrence, left breast benign Abdominal:     General: Abdomen is flat. Bowel sounds are normal. There is no distension.     Palpations: Abdomen is soft.     Tenderness: There is no abdominal tenderness.  Musculoskeletal:        General: Swelling (right arm + lymphedema) present.     Cervical back: Neck supple.  Lymphadenopathy:     Cervical: No cervical adenopathy.     Upper Body:     Right upper body: No supraclavicular or axillary adenopathy.     Left upper body: No supraclavicular or axillary adenopathy.  Skin:    General: Skin is warm and dry.     Findings: No rash.  Neurological:     General: No focal deficit present.     Mental Status: She is alert.  Psychiatric:        Mood and Affect: Mood normal.        Behavior: Behavior normal.     LABORATORY DATA: Pending   ASSESSMENT and THERAPY PLAN:   Assessment and Plan Assessment & Plan Estrogen and HER2 positive right breast cancer, status post mastectomy, on tamoxifen , in remission, under active surveillance Breast cancer in remission post-mastectomy, chemotherapy, radiation, and Herceptin . On tamoxifen  for management. Recent mammogram negative for malignancy. Emphasized 10-year tamoxifen  due to node-positive disease. Discussed  tamoxifen 's bone protective effect. Introduced Guardant Reveal for recurrence detection via circulating tumor DNA. Explained that if test is positive we will need to order CT C/A/P, bone scan.   - Continue tamoxifen  therapy. - Perform Guardant Reveal testing today and repeat every 6 months. - Schedule follow-up appointments annually while on tamoxifen . - Ensure regular mammograms and monitor for signs of recurrence.  Right upper extremity lymphedema with intermittent right hand swelling and stiffness Intermittent right hand swelling and stiffness likely due to lymphedema. Discussed lymphedema's potential to cause joint stiffness. - Use compression sleeve and glove for right arm. - Consider referral to physical therapy if symptoms do not improve.  Possible tamoxifen -related photosensitivity and hypersensitivity reactions Reports of photosensitivity and hypersensitivity reactions, possibly related to tamoxifen . - Advise use of sunscreen with SPF 50. - Recommend use of bug spray to prevent insect bites.   All questions were answered. The patient knows to call the clinic with any problems, questions or concerns. We can certainly see the patient much sooner if necessary.  Total encounter time:30 minutes*in face-to-face visit time, chart review, lab review, care coordination, order entry, and documentation of the encounter time.    Morna Kendall, NP 03/08/24 11:48 AM Medical Oncology and Hematology [facility] [address] [place], [place] [address] Tel. [phone]    Fax. [phone]  *Total Encounter Time as defined by the Centers for Medicare and Medicaid Services includes, in addition to the face-to-face time of a patient visit (documented in the note above) non-face-to-face time: obtaining and reviewing outside history, ordering and reviewing medications, tests or procedures, care coordination (communications with other health care professionals or caregivers) and  documentation in the medical record.

## 2024-03-09 ENCOUNTER — Telehealth: Payer: Self-pay | Admitting: Adult Health

## 2024-03-09 NOTE — Telephone Encounter (Signed)
 left vm for pt about year scheduled appt date and time

## 2024-03-17 ENCOUNTER — Encounter: Payer: Self-pay | Admitting: Adult Health

## 2024-03-18 LAB — GUARDANT REVEAL

## 2025-03-09 ENCOUNTER — Encounter: Admitting: Adult Health
